# Patient Record
Sex: Male | Born: 1960
Health system: Southern US, Community
[De-identification: ages and names within clinical notes are randomized; demographics above are authoritative.]

## PROBLEM LIST (undated history)

## (undated) DIAGNOSIS — R972 Elevated prostate specific antigen [PSA]: Secondary | ICD-10-CM

## (undated) DIAGNOSIS — E559 Vitamin D deficiency, unspecified: Secondary | ICD-10-CM

## (undated) DIAGNOSIS — E785 Hyperlipidemia, unspecified: Secondary | ICD-10-CM

## (undated) DIAGNOSIS — I10 Essential (primary) hypertension: Secondary | ICD-10-CM

## (undated) DIAGNOSIS — E1165 Type 2 diabetes mellitus with hyperglycemia: Secondary | ICD-10-CM

## (undated) HISTORY — DX: Elevated prostate specific antigen (PSA): R97.20

## (undated) HISTORY — DX: Type 2 diabetes mellitus with hyperglycemia: E11.65

## (undated) HISTORY — DX: Vitamin D deficiency, unspecified: E55.9

## (undated) HISTORY — DX: Hyperlipidemia, unspecified: E78.5

## (undated) HISTORY — DX: Hypercalcemia: E83.52

---

## 2011-02-27 ENCOUNTER — Encounter: Payer: Self-pay | Admitting: Internal Medicine

## 2011-02-27 ENCOUNTER — Emergency Department (HOSPITAL_COMMUNITY): Payer: BC Managed Care – PPO

## 2011-02-27 ENCOUNTER — Inpatient Hospital Stay (HOSPITAL_COMMUNITY)
Admission: EM | Admit: 2011-02-27 | Discharge: 2011-03-01 | DRG: 125 | Disposition: A | Discharge status: Home / Self Care | Payer: BC Managed Care – PPO | Attending: Internal Medicine | Admitting: Internal Medicine

## 2011-02-27 DIAGNOSIS — R0789 Other chest pain: Principal | ICD-10-CM | POA: Diagnosis present

## 2011-02-27 DIAGNOSIS — J3489 Other specified disorders of nose and nasal sinuses: Secondary | ICD-10-CM | POA: Diagnosis present

## 2011-02-27 DIAGNOSIS — Z8249 Family history of ischemic heart disease and other diseases of the circulatory system: Secondary | ICD-10-CM

## 2011-02-27 DIAGNOSIS — I10 Essential (primary) hypertension: Secondary | ICD-10-CM | POA: Diagnosis present

## 2011-02-27 DIAGNOSIS — F17200 Nicotine dependence, unspecified, uncomplicated: Secondary | ICD-10-CM | POA: Diagnosis present

## 2011-02-27 DIAGNOSIS — E785 Hyperlipidemia, unspecified: Secondary | ICD-10-CM | POA: Diagnosis present

## 2011-02-27 LAB — CBC
HCT: 43.6 % (ref 39.0–52.0)
Hemoglobin: 15.5 g/dL (ref 13.0–17.0)
MCH: 31.9 pg (ref 26.0–34.0)
MCHC: 35.6 g/dL (ref 30.0–36.0)
MCV: 89.7 fL (ref 78.0–100.0)
Platelets: 293 10*3/uL (ref 150–400)
RBC: 4.86 MIL/uL (ref 4.22–5.81)
RDW: 13.4 % (ref 11.5–15.5)
WBC: 14.9 10*3/uL — ABNORMAL HIGH (ref 4.0–10.5)

## 2011-02-27 LAB — CK TOTAL AND CKMB (NOT AT ARMC)
CK, MB: 2.7 ng/mL (ref 0.3–4.0)
Relative Index: 1.3 (ref 0.0–2.5)
Total CK: 201 U/L (ref 7–232)

## 2011-02-27 LAB — DIFFERENTIAL
Basophils Absolute: 0 10*3/uL (ref 0.0–0.1)
Basophils Relative: 0 % (ref 0–1)
Eosinophils Absolute: 0 10*3/uL (ref 0.0–0.7)
Eosinophils Relative: 0 % (ref 0–5)
Lymphocytes Relative: 10 % — ABNORMAL LOW (ref 12–46)
Lymphs Abs: 1.4 10*3/uL (ref 0.7–4.0)
Monocytes Absolute: 0.3 10*3/uL (ref 0.1–1.0)
Monocytes Relative: 2 % — ABNORMAL LOW (ref 3–12)
Neutro Abs: 13.1 10*3/uL — ABNORMAL HIGH (ref 1.7–7.7)
Neutrophils Relative %: 88 % — ABNORMAL HIGH (ref 43–77)

## 2011-02-27 LAB — COMPREHENSIVE METABOLIC PANEL
ALT: 42 U/L (ref 0–53)
AST: 26 U/L (ref 0–37)
Albumin: 4.4 g/dL (ref 3.5–5.2)
Alkaline Phosphatase: 68 U/L (ref 39–117)
BUN: 10 mg/dL (ref 6–23)
CO2: 24 mEq/L (ref 19–32)
Calcium: 10.4 mg/dL (ref 8.4–10.5)
Chloride: 103 mEq/L (ref 96–112)
Creatinine, Ser: 0.9 mg/dL (ref 0.50–1.35)
GFR calc Af Amer: >90 mL/min (ref >90)
GFR calc non Af Amer: >90 mL/min (ref >90)
Glucose, Bld: 132 mg/dL — ABNORMAL HIGH (ref 70–99)
Potassium: 4 mEq/L (ref 3.5–5.1)
Sodium: 138 mEq/L (ref 135–145)
Total Bilirubin: 0.4 mg/dL (ref 0.3–1.2)
Total Protein: 7.9 g/dL (ref 6.0–8.3)

## 2011-02-27 LAB — PROTIME-INR
INR: 0.98 (ref 0.00–1.49)
Prothrombin Time: 13.2 seconds (ref 11.6–15.2)

## 2011-02-27 LAB — TROPONIN I: Troponin I: <0.3 ng/mL (ref <0.30)

## 2011-02-27 NOTE — H&P (Signed)
Hospital Admission Note Date: 02/27/2011  Patient name: Gregory Sosa Medical record number: [id number] Date of birth: Feb 28, 1961 Age: 50 y.o. Gender: male PCP: none.  Unassigned   Medical Service: Internal medicine teaching service  Attending physician:  Dr. Josem Kaufmann    1st Contact: Dr. Yaakov Guthrie    Pager: [phone] 2nd Contact: Dr Dorthula Rue    Pager: [phone]  After 5 pm or weekends: 1st Contact:   Pager: [phone] 2nd Contact:   Pager: [phone]  Chief Complaint: Chest pain, chest congestion, nausea, and vomiting  History of Present Illness: Patient is a 50 year old male with no past medical history who presents to [facility] emergency room with complaint of chest pain of one-day duration.  He describes this pain as a 7/10, mid to left-sided, non-radiating pain that feels like a tightening/squeezing . He denies any precipitating or alleviating factors and notes the pain has been constant and has not abated since it began earlier on the day of admission.  He notes his pain is not improved with the nitroglycerin drip started in the ER.  He notes some associated diaphoresis as well as nausea with 5 episodes of vomiting nonbilious, nonbloody emesis on the day of admission.   He reports some sensation of reflux but notes his symptoms are not improved with belching. He denies diarrhea.  He also reports some shortness of breath and dyspnea on exertion.  He denies syncope.  Additionally, he reports chest congestion for 2 weeks.  He admits to "mild" fevers at home but did not take his temperature. He reports an associated cough productive of milky, white sputum and also notes sinus congestion and a feeling of generalized weakness.  He has been taking over-the-counter Robitussin as well as Sudafed with no relief of his symptoms.  He reports his wife was recently ill with similar symptoms.  Past medical history: Patient denies any known past medical history.  Past surgical history: none  Meds: Over-the-counter  Robitussin Over-the-counter Sudafed Patient takes no medications on a daily basis  Allergies: none  Family History: Father: Deceased, age 71 secondary to MI.  History of hypertension Mother: Janalyn Shy, age 53 with diabetes mellitus and hypertension. Sister: Diabetes mellitus and hypertension  Social History: Patient is married with one son. 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 He smokes one half a pack of cigarettes per day and has smoked for approximately 20 years.  He consumes 2 glasses of wine with dinner daily. Denies any illicit drug use.  Review of Systems: Pertinent items are noted in HPI.  Physical Exam: Vital signs: T.: 98.8, HR: 67, BP: 154/82, RR: 18, O2 sat 99% on RA VItal signs reviewed and stable. GEN: Patient in mild distress secondary to pain.  Alert and oriented x 3.  Pleasant, conversant, and cooperative to exam. HEENT: head is autraumatic and normocephalic.  Neck is supple without palpable masses or lymphadenopathy.  No JVD or carotid bruits.  Vision intact.  EOMI.  PERRLA.  Sclerae anicteric.  Conjunctivae without pallor or injection. Mucous membranes are moist.   RESP:  Lungs are clear to ascultation bilaterally with diminished air movement.  No wheezes, ronchi, or rubs. CARDIOVASCULAR: regular rate, normal rhythm.  Clear S1, S2, no murmurs, gallops, or rubs. ABDOMEN: soft, non-tender, non-distended.  Bowels sounds present in all quadrants and slightly hypoactive.  No palpable masses. EXT: warm and dry.  Peripheral pulses equal, intact, and +2 globally.  No clubbing or cyanosis.  Trace edema in bilateral lower extremities. SKIN: warm  and dry with normal turgor.  No rashes or abnormal lesions observed. NEURO: CN II-XII grossly intact.  Muscle strength +5/5 in bilateral upper and lower extremities.  Sensation is grossly intact.  No focal deficit.   Lab results: Basic Metabolic Panel:  [facility] 02/27/11 2154  NA 138  K 4.0  CL 103  CO2 24    GLUCOSE 132*  BUN 10  CREATININE 0.90  CALCIUM 10.4  MG --  PHOS --   Liver Function Tests:  [facility] 02/27/11 2154  AST 26  ALT 42  ALKPHOS 68  BILITOT 0.4  PROT 7.9  ALBUMIN 4.4    CBC:  [facility] 02/27/11 2154  WBC 14.9*  NEUTROABS 13.1*  HGB 15.5  HCT 43.6  MCV 89.7  PLT 293   Cardiac Enzymes:  [facility] 02/27/11 2154  CKTOTAL 201  CKMB 2.7  CKMBINDEX --  TROPONINI <0.30     Imaging results:  Chest x-ray, portable: Findings: Midline trachea. Normal heart size and mediastinal contours. No pleural effusion or pneumothorax. Mildly low lumbars. Clear lungs. No free intraperitoneal air.  Impression: Low lung volumes, without acute disease.  Other results: EKG: Normal sinus rhythm with left axis deviation. Normal intervals. T wave inversion noted in the inferolateral leads.  Assessment & Plan by Problem:  Chest pain: Patient is a 50 year old male with no known past medical history presenting with one day of constant mid to lower substernal chest pain.  His EKG reveals T wave inversions in leads 2, 3, aVF as well as V5 and V6; there are no other findings to suggest pericarditis or tamponade.  The first set of cardiac enzymes are negative.  ACS remains a distinct possibility and will pursue workup accordingly.  Chest x-ray on did not reveal evidence of pneumothorax, pneumonia, or mediastinal widening to suggest aortic dissection.  Esophageal rupture also is unlikely as patient is not experiencing hematemesis.  GERD, esophageal spasm, and gallbladder dysfunction remain on the differential given his symptoms and associated nausea and vomiting. - Admit to telemetry - Cycle cardiac enzymes - 12-lead EKG in the morning - Pain control with morphine and sublingual nitroglycerin - Will give protonix for empiric treatment of GERD - Risk stratify with fasting lipid panel and hemoglobin A1c in the morning - ASA 81mg  daily  Sinus congestion and cough: Patient reports sinus  congestion with cough productive of milky mucus for approximately 2 weeks.  He is currently afebrile and hemodynamically stable. Chest x-ray did not reveal evidence of pneumonia, however only single portable film obtained; will order PA and lateral chest x-ray for further evaluation.  His CBC reveals leukocytosis with a left shift and ANC of 13.1 suggesting underlying infectious process.  Given the duration of his symptoms and reports of sinus congestion and increased sinus drainage,  it is reasonable to treat him empirically for acute sinusitis. - 2 view chest x-ray - Doxycycline 100 mg by mouth twice a day for 7-10 days  Nausea and vomiting:  It is possible these symptoms are related to problem #1 or #2. It is unlikely that he is suffering from gastroenteritis given his lack of diarrhea.   - Will give Zofran for symptomatic control.   Hypertension: Patient's blood pressure is elevated arrival to the emergency room. Is very likely that he suffers from previously undiagnosed hypertension especially given his family history. Will continue to monitor this.  Would consider initiating antihypertensive therapy with HCTZ. Will defer this to the primary team.  Tobacco use: Patient declines nicotine patch -  Will obtain smoking cessation consult  DVT prophylaxis: lovenox  R3______________________________ Dr. Nelda Bucks [phone]     R1______________________________ Dr. Vernice Jefferson [phone]  ATTENDING: I performed and/or observed a history and physical examination of the patient.  I discussed the case with the residents as noted and reviewed the residents' notes.  I agree with the findings and plan--please refer to the attending physician note for more details.  Signature________________________________  Printed Name_____________________________

## 2011-02-28 ENCOUNTER — Emergency Department (HOSPITAL_COMMUNITY): Payer: BC Managed Care – PPO

## 2011-02-28 DIAGNOSIS — R079 Chest pain, unspecified: Secondary | ICD-10-CM

## 2011-02-28 DIAGNOSIS — R0602 Shortness of breath: Secondary | ICD-10-CM

## 2011-02-28 DIAGNOSIS — J3489 Other specified disorders of nose and nasal sinuses: Secondary | ICD-10-CM

## 2011-02-28 LAB — LIPID PANEL
Cholesterol: 174 mg/dL (ref 0–200)
HDL: 43 mg/dL (ref >39)
LDL Cholesterol: 124 mg/dL — ABNORMAL HIGH (ref 0–99)
Total CHOL/HDL Ratio: 4 RATIO
Triglycerides: 35 mg/dL (ref <150)
VLDL: 7 mg/dL (ref 0–40)

## 2011-02-28 LAB — CBC
HCT: 40 % (ref 39.0–52.0)
Hemoglobin: 13.8 g/dL (ref 13.0–17.0)
MCH: 30.7 pg (ref 26.0–34.0)
MCHC: 34.5 g/dL (ref 30.0–36.0)
MCV: 89.1 fL (ref 78.0–100.0)
Platelets: 294 10*3/uL (ref 150–400)
RBC: 4.49 MIL/uL (ref 4.22–5.81)
RDW: 13.5 % (ref 11.5–15.5)
WBC: 12 10*3/uL — ABNORMAL HIGH (ref 4.0–10.5)

## 2011-02-28 LAB — GLUCOSE, CAPILLARY
Glucose-Capillary: 124 mg/dL — ABNORMAL HIGH (ref 70–99)
Glucose-Capillary: 150 mg/dL — ABNORMAL HIGH (ref 70–99)

## 2011-02-28 LAB — MRSA PCR SCREENING: MRSA by PCR: NEGATIVE

## 2011-02-28 LAB — TSH: TSH: 2.458 u[IU]/mL (ref 0.350–4.500)

## 2011-02-28 LAB — HEMOGLOBIN A1C
Hgb A1c MFr Bld: 6.6 % — ABNORMAL HIGH (ref <5.7)
Mean Plasma Glucose: 143 mg/dL — ABNORMAL HIGH (ref <117)

## 2011-02-28 LAB — CARDIAC PANEL(CRET KIN+CKTOT+MB+TROPI)
CK, MB: 2.4 ng/mL (ref 0.3–4.0)
CK, MB: 2.3 ng/mL (ref 0.3–4.0)
CK, MB: 2.2 ng/mL (ref 0.3–4.0)
Relative Index: 1.7 (ref 0.0–2.5)
Relative Index: 1.6 (ref 0.0–2.5)
Relative Index: 1.6 (ref 0.0–2.5)
Total CK: 144 U/L (ref 7–232)
Total CK: 141 U/L (ref 7–232)
Total CK: 140 U/L (ref 7–232)
Troponin I: <0.3 ng/mL (ref <0.30)
Troponin I: <0.3 ng/mL (ref <0.30)
Troponin I: <0.3 ng/mL (ref <0.30)

## 2011-02-28 LAB — HEPARIN LEVEL (UNFRACTIONATED): Heparin Unfractionated: 0.59 IU/mL (ref 0.30–0.70)

## 2011-02-28 LAB — MAGNESIUM: Magnesium: 2 mg/dL (ref 1.5–2.5)

## 2011-02-28 LAB — POCT ACTIVATED CLOTTING TIME: Activated Clotting Time: 155 seconds

## 2011-03-01 ENCOUNTER — Inpatient Hospital Stay (HOSPITAL_COMMUNITY): Payer: BC Managed Care – PPO

## 2011-03-01 LAB — CBC
HCT: 41.4 % (ref 39.0–52.0)
Hemoglobin: 14 g/dL (ref 13.0–17.0)
MCH: 30.8 pg (ref 26.0–34.0)
MCHC: 33.8 g/dL (ref 30.0–36.0)
MCV: 91 fL (ref 78.0–100.0)
Platelets: 295 10*3/uL (ref 150–400)
RBC: 4.55 MIL/uL (ref 4.22–5.81)
RDW: 13.6 % (ref 11.5–15.5)
WBC: 9.8 10*3/uL (ref 4.0–10.5)

## 2011-03-01 LAB — BASIC METABOLIC PANEL
BUN: 9 mg/dL (ref 6–23)
CO2: 24 mEq/L (ref 19–32)
Calcium: 9.2 mg/dL (ref 8.4–10.5)
Chloride: 103 mEq/L (ref 96–112)
Creatinine, Ser: 0.9 mg/dL (ref 0.50–1.35)
GFR calc Af Amer: >90 mL/min (ref >90)
GFR calc non Af Amer: >90 mL/min (ref >90)
Glucose, Bld: 113 mg/dL — ABNORMAL HIGH (ref 70–99)
Potassium: 3.8 mEq/L (ref 3.5–5.1)
Sodium: 138 mEq/L (ref 135–145)

## 2011-03-01 LAB — GLUCOSE, CAPILLARY: Glucose-Capillary: 114 mg/dL — ABNORMAL HIGH (ref 70–99)

## 2011-03-01 LAB — HEPARIN LEVEL (UNFRACTIONATED): Heparin Unfractionated: <0.1 IU/mL — ABNORMAL LOW (ref 0.30–0.70)

## 2011-03-01 NOTE — Cardiovascular Report (Signed)
NAME:  Gregory Sosa, ENGEN NO.:  [id number]  MEDICAL RECORD NO.:  [id number]  LOCATION:  2506                         FACILITY:  [facility]  PHYSICIAN:  Landry Corporal, MD DATE OF BIRTH:  24-Feb-1961  DATE OF PROCEDURE:  02/28/2011 DATE OF DISCHARGE:                           CARDIAC CATHETERIZATION   PRIMARY PHYSICIAN ON THE SERVICE:  Doneen Poisson, MD, Teaching Service.  PRIMARY CARDIOLOGIST:  Italy Hilty, MD  PROCEDURE PERFORMING PHYSICIAN:  Landry Corporal, MD  PROCEDURES PERFORMED: 1. Left heart catheterization via 5-French right femoral artery     access. 2. Left ventriculography in the RAO projection using 11 mL of contrast     for 33 seconds. 3. Native coronary angiography. 4. Intracoronary nitroglycerin injection.  INDICATION: 1. Chest pain/pressure. 2. Shortness of breath.  BRIEF HISTORY:  Mr. Mayberry is a 50 year old African American gentleman with a history of hypertension, tobacco abuse, and family history of coronary artery disease who presented to the emergency room with chest discomfort on February 27, 2011.  Symptoms began about 3 p.m. while he was out in the yard playing with children and developed 7/10 left-sided chest pain after smoking a cigarette.  He had some diaphoresis with nausea and vomiting.  EKGs were noted to have T-wave inversions in the inferolateral distribution.  Based on the abnormal ECG and his symptoms, [facility] was consulted.  The patient seen by Dr. Rennis Golden and the decision was made to proceed with diagnostic cardiac catheterization.  Also, of note, the patient has been having a significant cough over the last couple of weeks with possible upper respiratory infection/myocarditis.  The risks, benefits, alternatives, and indications of procedure were explained to the patient in detail.  Risks include but not limited to death, ventricular tachycardia/atrial fibrillation, myocardial infarction,  stroke, adverse medical reaction, contrast-induced renal insufficiency, vascular or coronary injury requiring emergent vascular cardiac surgery, bradycardia requiring pacemaker placement.  The patient voiced understanding and agreed to proceed.  Informed consent was obtained with signed form placed on chart.  PROCEDURE:  The patient was brought to [facility], prepped and draped in a sterile fashion.  After a time-out period was performed, the patient was sedated with intravenous Versed and fentanyl and the right groin was anesthetized using 1% subcutaneous lidocaine.  The right femoral head was localized using tactile fluoroscopic guidance.  The right common femoral artery was accessed using modified Seldinger technique with placement of 5-French sheath.  Sheaths  were aspirated and flushed and then first a 5-French JL-4 followed by a 5-French JR-4 catheter advanced over wire.  Multiple angiographic views of left and right coronary system were obtained.  For the left coronary angiography, 200 mcg of intracoronary nitroglycerin was administered to evaluate for any evidence of the spasm.  After the right coronary angiography was performed, the JL-4 catheter was exchanged over a wire for an angled pigtail catheter which was used to advance across the aortic valve over a wire.  Left ventricular hemodynamics were then measured and then the left ventriculography was performed in the RAO projection.  Left ventricle hemodynamics were then remeasured and the catheter was pulled back across  aortic valve measuring pullback gradient.  The patient was stable before, during, and after the procedure. Estimated blood loss was less than 10 mL and there were no complications.  CATH LAB STATISTICS: 1. Sedation.  No intravenous medications.  The patient was     premedicated with Valium 5 mg p.o. and Benadryl 25 mg p.o. 2. Antihypertensives, a total of 20 mg of IV  hydralazine and two     boluses of 10 mg was administered. 3. Intracoronary nitroglycerin 200 mcg as well as intraarterial     nitroglycerin 200 mcg. 4. Total contrast was 90 mL.  HEMODYNAMICS: 1. Left ventricular pressure 158/16 mmHg with EDP of 22 mmHg. 2. Central aortic pressure 167/82 mmHg with mean of 110 mmHg.  This is     after the hydralazine was administered.  LEFT VENTRICULOGRAPHY:  Ejection fraction is 60-65% with no wall motion abnormalities.  ANGIOGRAPHIC FINDINGS: 1. Left main is a large-caliber vessel, which trifurcates into an LAD,     ramus, and circumflex artery.  There is also a very small septal     trunk becomes relative left main as well as a small atrial branch     as well.  There is no significant disease in the left main. 2. LAD is a moderate-caliber vessel, gives rise to a small first     diagonal branch.  It bifurcates into the another also relatively     small diagonal branch in the midportion.  The LAD reached down     around the apex.  It gave rise to a small diagonal branch down     below.  There is no significant disease in the entire LAD     distribution.  The wraparound apex likely fills at least the distal     third of the of the posterior descending artery. 3. Ramus intermedius is a moderate to large caliber vessel, at least     the same size as the LAD.  It bifurcates and basically gives what     would be a diagonal as well as an obtuse marginal distribution.  No     significant disease noted. 4. The circumflex is somewhat tortuous and gives rise to at least 2     obtuse marginals before going into the atrioventricular groove     where it gives off at least 2 posterolateral branches. 5. The right coronary artery is a dominant vessel.  It has a very     proximal conus branch.  After sharp bend, in the midportion, there     is an interventricular branch.  The vessel then bifurcates into an     extending posterior atrioventricular groove artery  and a small     posterolateral branch.  The continuation into the posterior     descending artery is a relatively small vessel.  It probably gives     to about the two-thirds distance to the apex.  No coronary artery     disease in the RCA noted.  IMPRESSION: 1. No angiographic evidence of any significant coronary artery disease     to explain the patient's chest pain, therefore symptoms are most     likely nonanginal in nature. 2. Severe hypertension with elevated end-diastolic pressure, although     the patient's blood pressure was relatively stable pre-cath. 3. Normal left ventricular ejection fraction with no wall motion     abnormalities. 4. EKG changes are most likely secondary to potentially left     ventricular hypertrophy  given hypertension, but we will review     echocardiogram to determine if this is correct.  PLAN: 1. Standard postcatheterization care after sheath removal in the     holding area. 2. Blood pressure control. 3. Treat upper respiratory tract infection per Teaching Service.          ______________________________ Landry Corporal, MD     DWH/MEDQ  D:  02/28/2011  T:  03/01/2011  Job:  [id number]  cc:   Italy Hilty, MD 2nd [facility]iac [facility]  Electronically Signed by Bryan Lemma MD on 03/01/2011 09:50:11 PM

## 2011-03-07 ENCOUNTER — Telehealth: Payer: Self-pay | Admitting: *Deleted

## 2011-03-07 NOTE — Telephone Encounter (Signed)
Pt called with c/o swelling to catheterization site. Pt was cathed on 10/23 during hospitalization by Dr Herbie Baltimore with [facility]. I called pt and gave him there # [phone]) and asked him to call there office, as they would be the one to evaluate cath site.   He will call back if any problem.

## 2011-03-07 NOTE — Telephone Encounter (Signed)
I agree

## 2011-03-09 NOTE — Consult Note (Signed)
NAMEMarland Kitchen  Gregory, Sosa NO.:  [id number]  MEDICAL RECORD NO.:  [id number]  LOCATION:  2923                         FACILITY:  [facility]  PHYSICIAN:  Italy Karrington Studnicka, MD         DATE OF BIRTH:  1960/06/05  DATE OF CONSULTATION:  02/28/2011 DATE OF DISCHARGE:                                CONSULTATION   CHIEF COMPLAINT:  Chest pain.  HISTORY OF PRESENT ILLNESS:  Gregory Sosa is a pleasant 50 year old African American male with history of tobacco abuse, and family history of coronary artery disease who presented to the emergency department yesterday with complaints of chest discomfort.  He tells me that his symptoms began around 3:00 p.m. after he had been out in the yard playing with his children, he went inside to smoke a cigarette and then developed 7/10 left anterior chest discomfort.  The discomfort did not radiate.  He had experienced some diaphoresis with this as well as nausea and vomiting.  He does report some mild shortness of breath with this.  He has not noted any change in his activity tolerance nor has he experienced any exertional dyspnea.  His initial EKG in the emergency department revealed sinus rhythm with T-wave inversions inferior and lateral.  He was treated with sublingual nitroglycerin without any improvement.  However, repeat EKG was performed, which did reveal continued T-wave inversion inferiorly, however, there was evidence of minimal ST elevation in V2 as well as T-wave inversions in V3 through V6, which is persistent at this time.  He has vomited several times and is on IV nitroglycerin currently as well as heparin and he is currently pain free.  His initial cardiac enzymes were negative and a 2nd set is currently pending.  We are asked to consult regarding his abnormal EKG and possible coronary artery disease.  PAST MEDICAL HISTORY:  Unremarkable.  FAMILY HISTORY:  Father had an MI at 100 passed away.  Mother has hypertension.  SOCIAL  HISTORY:  He is married.  His wife is a Engineer, civil (consulting) here on March 3300. He smokes a half pack of cigarettes a day.  He has 3 glasses of wine daily.  He works as a Production designer, theatre/television/film in a Programme researcher, broadcasting/film/video.  ALLERGIES:  None known.  CURRENT MEDICATIONS:  None.  REVIEW OF SYSTEMS:  As per HPI, otherwise negative.  Cough and respiratory infection recently, taking over-the-counter decongestant.  PHYSICAL EXAM:  VITAL SIGNS:  Blood pressure is 114/65, pulse is 72 and regular, pulse ox is 98%, respirations 16. GENERAL:  This is a pleasant 50 year old African American male in no acute distress. HEENT:  Pupils are equal, reactive to light and accommodation. Extraocular movements intact. NECK:  Supple.  No JVD, carotid bruits, or thyromegaly.  CARDIOVASCULAR: Regular rate and rhythm.  S1-S2 without appreciable murmur, gallop or rub. LUNGS:  Clear to auscultation bilaterally.  Normal respiratory effort. ABDOMEN:  Soft, nontender, without hepatosplenomegaly or masses. EXTREMITIES:  Radial, femoral, dorsal pedal arteries are present, without lower extremity edema.  No clubbing, cyanosis, or ulcers. NEUROLOGIC:  :  Oriented to person, place, and time.  Normal mood and affect.  LABORATORY DATA:  EKG is described above.  Cardiac and initial cardiac enzymes are negative.  Glucose is 132, BUN 10 and creatinine 0.9.  PT is 13.2, INR 0.9.  White blood cell count is 14.9.  Chest x-ray reveals questionable mild lower lobe opacity in the lateral view.  IMPRESSION: 1. Chest pain, abnormal EKG. 2. Family history of coronary artery disease. 3. Unknown lipid status. 4. Possible pneumonia.  PLAN:  We will continue with the IV nitroglycerin as well as heparin per pharmacy.  We will start him on low-dose beta blocker and aspirin. Given his EKG changes and his risk factors, I feel that he would have cardiac catheterization will be best to evaluate for obstructive coronary artery disease.  We will continue to cycle his  enzymes.  We will check an echo as well as a TSH and a hemoglobin A1c.  We will check a fasting lipid panel this morning.  He is to remain n.p.o.    ______________________________ [employer], NP   ______________________________ [place] Varetta Chavers, MD    LS/MEDQ  D:  02/28/2011  T:  02/28/2011  Job:  [id number]  cc:   [facility]  Electronically Signed by Charmian Muff NP on 03/06/2011 09:55:10 PM Electronically Signed by Kirtland Bouchard. Kalissa Grays M.D. on 03/09/2011 08:18:49 AM

## 2011-03-12 NOTE — Discharge Summary (Signed)
NAME:  LANGDON, CROSSON NO.:  [id number]  MEDICAL RECORD NO.:  [id number]  LOCATION:  2506                         FACILITY:  [facility]  PHYSICIAN:  Doneen Poisson, MD     DATE OF BIRTH:  08-04-1960  DATE OF ADMISSION:  02/27/2011 DATE OF DISCHARGE:  03/01/2011                              DISCHARGE SUMMARY   DISCHARGE DIAGNOSES: 1. Chest pain:  No evidence of coronary artery disease on cardiac     catheterization.  Etiology uncertain.  Differential diagnosis      included coronary vasospasm, gastrointestinal etiology or      acute viral upper respiratory tract infection. 2. Hypertension. 3. Cough. 4. Nausea and vomiting. 5. Tobacco use. 6. Diabetes, Type II, diagnosed October 2012.  DISCHARGE MEDICATIONS: 1. Acetaminophen 6 mg by mouth every 4 hours as needed for pain. 2. Amlodipine 5 mg by mouth daily. 3. Nicotine patch 21 mg for 24 hours transdermally daily. 4. Omeprazole 20 mg by mouth daily. 5. Aspirin 81 mg by mouth daily. 6. Robitussin 2 teaspoons by mouth every 6 hours as needed for cough.  DISPOSITION:  Mr. Rands is scheduled to follow up in the [facility] with Dr. Elyse Jarvis.  At this time, he should establish care as he has not seen a doctor for many many years.   His hypertension control should be readdressed as he was found to be hypertensive during this hospitalization, and was started on amlodipine. Of note, his hemoglobin A1c was 6.6%, so he qualifies for a diagnosis  of diabetes mellitus.  During this hospitalization, his lipid panel was checked and demonstrated a total cholesterol of 174 and an LDL of 124. On follow-up in the outpatient clinic, this should be addressed and possible treatment should be considered given the diagnosis of diabetes mellitus.  PROCEDURES: 1. Portable chest x-ray, February 27, 2011:  Low lung volumes without     acute disease. 2. Two view chest x-ray February 28, 2011.  No evidence of  pleural     effusion or pneumothorax.  Lungs are well aerated.  The heart is     normal in size.  There is a question of mild lower lobe opacity on     the lateral view given increased density overlying the lower     thoracic spine. 3. Esophagram/barium swallow March 01, 2011:  Normal.  Negative for     stricture.  Negative for mass or diverticulum.  Negative for hiatal     hernia or reflux.  Barium tablet passed through the stomach without     delay. 4. Cardiac catheterizations March 01, 2011:  Performed by Dr. Morton Stall:  No angiographic evidence of any significant coronary     artery disease to explain his chest pain.  Severe hypertension with      elevated end-diastolic pressure although his blood pressure was      relatively stable precatheterization.  Normal left ventricular      ejection fraction with no wall motion abnormalities.  CONSULTATIONS:  Cardiology.  ADMISSION HISTORY AND PHYSICAL:  Mr. Capp is a 50 year old man with no past medical history who presents to  [facility] Emergency Room withcomplaints of chest pain of 1-day duration.  He describes the pain as 7/10 mid to left-sided nonradiating pain that feels like a tightening/squeezing.  He denies any precipitating or alleviating factors and notes the pain has been constant and has not abated since it began earlier on the day of admission.  He notes his pain has not improved with the nitroglycerin drip started in the ER.  He notes some associated diaphoresis as well as nausea with 5 episodes of vomiting nonbilious, nonbloody emesis on the day of admission.  He reports some sensation of reflux.  He denies diarrhea.  He also reports some  shortness of breath and dyspnea on exertion.  He denies syncope. Additionally, he reports chest congestion for 2 weeks.  He admits to mild fevers at home but did not take his temperature.  He reports associated cough productive of milky white sputum and also notes  sinus congestion and a feeling of generalized weakness.  He has been taking over-the-counter Robitussin as well as Sudafed with no relief of his symptoms.  He reports his wife was recently ill with similar symptoms  ADMISSION PHYSICAL EXAMINATION: VITAL SIGNS:  Temperature 98.8, heart rate 67, blood pressure 154/82,  respirations 18, O2 saturation 99% on room air. GENERAL:  Mild distress secondary to pain.  Alert and oriented x 3.   Pleasant, conversant and cooperative to exam. HEENT:  Head is atraumatic and normocephalic. NECK:  Supple without palpable masses or lymphadenopathy.  No JVD or carotid bruits.  Vision intact.  Extraocular muscle function intact. Pupils equally round and reactive to light anteriorly.  Sclerae are anicteric.  Conjunctiva without pallor or injection.  Mucous membranes are moist. RESPIRATIONS:  Lungs are clear to auscultation bilaterally with diminished air movement. CARDIOVASCULAR:  Regular rate and rhythm.  No murmurs, rubs, or gallops. ABDOMEN:  Soft, nontender, and nondistended.  Bowel sounds present in all quadrants and slightly hypoactive.  No palpable masses. EXTREMITIES:  Warm and dry.  Peripheral pulses equal, intact and 2+ globally.  No clubbing or cyanosis.  Trace edema in bilateral lower extremities. SKIN:  Warm and dry with normal turgor.  No rashes or abdominal lesions observed. NEURO:  Cranial nerves II-XII grossly intact.  Muscle strength 5/5 in bilateral upper and lower extremities.  Sensation is grossly intact.  No focal deficits.  ADMISSION LAB RESULTS: Basic metabolic panel:  Sodium 138, potassium 4.0, chloride 103, bicarb  24, glucose 132, BUN 10, creatinine 0.90, calcium 10.4. Liver function tests:  AST 26, ALT 42, alkaline phosphatase 68, total bilirubin 0.4, protein 7.9, 7.9, albumin 4.4. CBC:  White blood cell count 14.9, absolute neutrophils 13.1, hemoglobin 15.5, hematocrit 43.6, platelets 293. Cardiac enzymes:  CK total 201,  CK-MB 2.7, troponin I less than 0.30.  HOSPITAL COURSE BY PROBLEM: 1. Chest pain:  Mr. Pellow presented with 1 day of constant mid lower     substernal chest pain.  On the night of admission, his EKG     developed dynamic T-wave inversions, not present on the admission EKG,     which resolved the following morning.  Three sets of cardiac enzymes      were negative.  He underwent cardiac catheterization and was found to     have no evidence of coronary artery disease.  His chest pain did not      respond to nitroglycerin but slowly resolved during the course of the      hospitalization.  Initially pneumonia was on the  differential, but on      further review of the admission chest x-ray and repeat 2 view chest     x-ray failed to reveal evidence of a pneumonia.  We could not rule      out coronary vasospasm causing ischemic chest pain, chest pain of      gastrointestinal origin, muscle skeletal origin, or due to an acute      upper respiratory tract infection.  He was risk stratified by checking      a hemoglobin A1c that was 6.6% as well as a fasting lipid panel that      revealed an LDL of 124 and a total cholesterol of 174.      As described in the problem for hypertension below, he was     started on amlodipine.  This would also treat coronary vasospasm if this     was the etiology of his chest pain.   A barium swallow was also performed     and was negative for any stricture or other abnormality.      Mr. Isensee was also started on proton pump inhibitor, omeprazole and     discharged in case the etiology of his chest pain was gastroesophageal     reflux disease.  He did report some sensation of reflux in the     days prior to admission.  2. Cough:  Mr. Dains presented with a cough productive of milky     mucus for approximately 2 weeks.  He also presented with a     leukocytosis with a left shift and absolute neutrophil count of     13.1.  Chest x-ray in our opinion was negative for  pneumonia.  This     was likely due to a viral upper respiratory tract infection.  3. Nausea and vomiting:  Etiology of this was uncertain.  He     was treated with Zofran for symptomatic control during this     hospitalization.  This is possibly related to his 2 weeks of cough     and likely upper respiratory tract infection.  4. Hypertension:  Mr. Tomko blood pressure on admission was     152/82.  He was started on metoprolol initially for blood     pressure control by the cardiology consultant.  Pressures during     hospitalization ranged from 114-141 systolic over 64-86 diastolic.     On discharge, metoprolol was changed to amlodipine.  The reasoning     behind this was that if the etiology of his chest pain     was indeed coronary vasospasm that amlodipine would help this as     well.  5. Tobacco use:  Smoking cessation consult was obtained.  He     declined nicotine patch during hospitalization, but a nicotine     patch was prescribed on discharge.  This should be further addressed on     follow-up with his new primary care provider in the outpatient     clinic.  DISCHARGE VITAL SIGNS:  Temperature 98.2, pulse 80, respirations 16, blood pressure 141/86, O2 saturation 98% on room air.  DISCHARGE LABORATORY RESULTS: 1. Basic metabolic panel:  Sodium 138, potassium 3.8, chloride 103,     bicarbonate 24, glucose 113, BUN 9, creatinine 0.90, calcium 9.2. 2. CBC: White blood cell count 9.8, hemoglobin 14.0, hematocrit 41.4,     platelets 295. 3. Cardiac enzymes negative x 3 sets. 4. TSH 2.458 (within normal limits). 5. Hemoglobin  A1c 6.6%. 6. Magnesium 2.0. 7. Lipid profile:  Total cholesterol 174, triglycerides 35, HDL 43,     LDL 124.   ______________________________ Blanca Friend, MD   ______________________________ Doneen Poisson, MD   BW/MEDQ  D:  03/05/2011  T:  03/05/2011  Job:  [id number]  Electronically Signed by Blanca Friend MD on 03/06/2011 10:13:07  PM Electronically Signed by Doneen Poisson  on 03/12/2011 05:54:52 PM

## 2011-03-21 ENCOUNTER — Ambulatory Visit (INDEPENDENT_AMBULATORY_CARE_PROVIDER_SITE_OTHER): Payer: BC Managed Care – PPO | Admitting: Internal Medicine

## 2011-03-21 DIAGNOSIS — Z720 Tobacco use: Secondary | ICD-10-CM

## 2011-03-21 DIAGNOSIS — E785 Hyperlipidemia, unspecified: Secondary | ICD-10-CM

## 2011-03-21 DIAGNOSIS — R079 Chest pain, unspecified: Secondary | ICD-10-CM

## 2011-03-21 DIAGNOSIS — I10 Essential (primary) hypertension: Secondary | ICD-10-CM

## 2011-03-21 DIAGNOSIS — F17200 Nicotine dependence, unspecified, uncomplicated: Secondary | ICD-10-CM

## 2011-03-21 DIAGNOSIS — E119 Type 2 diabetes mellitus without complications: Secondary | ICD-10-CM

## 2011-03-21 NOTE — Patient Instructions (Signed)
Pleasetake your medicines as prescribed. Please schedule a follow up appointment in 2 months - at that time, we will recheck your [facility]. Please take your medicines as prescribed.

## 2011-03-23 DIAGNOSIS — Z87898 Personal history of other specified conditions: Secondary | ICD-10-CM

## 2011-03-23 DIAGNOSIS — E1165 Type 2 diabetes mellitus with hyperglycemia: Secondary | ICD-10-CM

## 2011-03-23 DIAGNOSIS — E785 Hyperlipidemia, unspecified: Secondary | ICD-10-CM

## 2011-03-23 DIAGNOSIS — Z720 Tobacco use: Secondary | ICD-10-CM | POA: Insufficient documentation

## 2011-03-23 DIAGNOSIS — E119 Type 2 diabetes mellitus without complications: Secondary | ICD-10-CM | POA: Insufficient documentation

## 2011-03-23 DIAGNOSIS — IMO0002 Reserved for concepts with insufficient information to code with codable children: Secondary | ICD-10-CM

## 2011-03-23 DIAGNOSIS — I10 Essential (primary) hypertension: Secondary | ICD-10-CM | POA: Insufficient documentation

## 2011-03-23 HISTORY — DX: Personal history of other specified conditions: Z87.898

## 2011-03-23 HISTORY — DX: Type 2 diabetes mellitus with hyperglycemia: E11.65

## 2011-03-23 HISTORY — DX: Reserved for concepts with insufficient information to code with codable children: IMO0002

## 2011-03-23 HISTORY — DX: Hyperlipidemia, unspecified: E78.5

## 2011-03-23 NOTE — Assessment & Plan Note (Signed)
Hospital follow up for chest pain with ST- T wave changes in inferolateral leads but s/p clean cath with no evidence of significant CAD. Differentials for his presentation included hypertension related changes vs esophageal spasm. He denied any recurrence of chest pain since his discharge.He was discharged home on amlodipine and omeprazole but he has not started taking any of the meds. -He was counseled on the importance of taking his BP meds regularly. -Continue to monitor his symptoms.

## 2011-03-23 NOTE — Progress Notes (Signed)
  Subjective:    Patient ID: Gregory Sosa, male    DOB: December 11, 1960, 50 y.o.   MRN: [id number]  HPI; 50 year old man with newly diagnosed diabetes, HTN comes to the clinic for a hospital follow up.  He was recently hospitalized from 02/27/11 to 03/01/11 for chest pain evaluation that was associated with significant ST - T wave changes in the inferolateral  leads, subsequent to which he underwent diagnostic cath that revealed non - obstructive CAD and his symptoms were attributed to hypertension.  As of today, he denies nay recurrence of chest pain, SOB, diaphoresis, nausea or vomiting. He has not been taking any meds including anti- hypertensives that he was discharged on , from the hospital.      Review of Systems  Constitutional: Negative for fever, chills, appetite change and fatigue.  HENT: Negative for congestion, rhinorrhea, sneezing and postnasal drip.   Eyes: Negative for visual disturbance.  Respiratory: Negative for apnea, cough, choking, chest tightness, shortness of breath, wheezing and stridor.   Cardiovascular: Negative for chest pain, palpitations and leg swelling.  Gastrointestinal: Negative for nausea, vomiting, abdominal pain, diarrhea and blood in stool.  Genitourinary: Negative for dysuria, urgency and hematuria.  Musculoskeletal: Negative for arthralgias.  Neurological: Negative for dizziness.  Hematological: Negative for adenopathy.       Objective:   Physical Exam  Constitutional: He is oriented to person, place, and time. He appears well-developed and well-nourished. No distress.  HENT:  Head: Normocephalic and atraumatic.  Mouth/Throat: No oropharyngeal exudate.  Eyes: Conjunctivae and EOM are normal. Pupils are equal, round, and reactive to light. No scleral icterus.  Neck: Normal range of motion. Neck supple. No JVD present. No tracheal deviation present. No thyromegaly present.  Cardiovascular: Normal rate, regular rhythm, normal heart sounds and intact  distal pulses.  Exam reveals no gallop and no friction rub.   No murmur heard. Pulmonary/Chest: Effort normal and breath sounds normal. No stridor. No respiratory distress. He has no wheezes. He has no rales. He exhibits no tenderness.  Abdominal: Soft. Bowel sounds are normal. He exhibits no distension and no mass. There is no tenderness. There is no rebound and no guarding.  Musculoskeletal: Normal range of motion. He exhibits no edema and no tenderness.  Lymphadenopathy:    He has no cervical adenopathy.  Neurological: He is alert and oriented to person, place, and time. He has normal reflexes. He displays normal reflexes. No cranial nerve deficit. He exhibits normal muscle tone. Coordination normal.  Skin: Skin is warm. He is not diaphoretic.          Assessment & Plan:

## 2011-03-23 NOTE — Assessment & Plan Note (Signed)
Newly diagnosed diabetes with A1C of 6.6 in October 2012.  He regularly exercises and wanted to give a trial of getting that under  control with diet and exercise. - Will recheck his AIC in 3 months. - Will start him on metformin if his AIC deteriorates further in 3 months.

## 2011-03-23 NOTE — Assessment & Plan Note (Signed)
Lab Results  Component Value Date   NA 138 03/01/2011   K 3.8 03/01/2011   CL 103 03/01/2011   CO2 24 03/01/2011   BUN 9 03/01/2011   CREATININE 0.90 03/01/2011    BP Readings from Last 3 Encounters:  03/21/11 148/94    Assessment: Hypertension control:  mildly elevated  Progress toward goals:  unchanged Barriers to meeting goals:  lack of understanding of disease management  Plan: Hypertension treatment:  continue current medications. He was advised to start taking amlodipine that he was discharged on, from the hospital.

## 2011-03-25 ENCOUNTER — Encounter: Payer: Self-pay | Admitting: Internal Medicine

## 2011-03-25 NOTE — Assessment & Plan Note (Signed)
Would try to get that controlled with diet and exercise first.  - Recheck FLP in 3 months.  - Would start him on statin if his LDL is persistently elevated or deteriorates further.

## 2011-06-14 ENCOUNTER — Telehealth: Payer: Self-pay | Admitting: Dietician

## 2011-06-14 NOTE — Telephone Encounter (Signed)
Patient was contacted by office staff to make an appointment with his PCP.  This patient wishes not to make any appointments with the clinic at this time. He states that he "sugar is fine", "he uses a friend's meter to test", and that he isn't having any Diabetic issues at this time and doesn't take any meds. Will not sch appointment At this time as he was only here for a HFU and does not want a return visit and he does not feel like he has to come here.

## 2011-06-30 ENCOUNTER — Encounter: Payer: Self-pay | Admitting: Internal Medicine

## 2011-06-30 ENCOUNTER — Ambulatory Visit (INDEPENDENT_AMBULATORY_CARE_PROVIDER_SITE_OTHER): Payer: BC Managed Care – PPO | Admitting: Internal Medicine

## 2011-06-30 DIAGNOSIS — Z79899 Other long term (current) drug therapy: Secondary | ICD-10-CM

## 2011-06-30 DIAGNOSIS — I10 Essential (primary) hypertension: Secondary | ICD-10-CM

## 2011-06-30 DIAGNOSIS — E785 Hyperlipidemia, unspecified: Secondary | ICD-10-CM

## 2011-06-30 DIAGNOSIS — R079 Chest pain, unspecified: Secondary | ICD-10-CM

## 2011-06-30 DIAGNOSIS — E119 Type 2 diabetes mellitus without complications: Secondary | ICD-10-CM

## 2011-06-30 LAB — POCT GLYCOSYLATED HEMOGLOBIN (HGB A1C): Hemoglobin A1C: 6.5

## 2011-06-30 LAB — GLUCOSE, CAPILLARY: Glucose-Capillary: 113 mg/dL — ABNORMAL HIGH (ref 70–99)

## 2011-06-30 NOTE — Progress Notes (Signed)
  Subjective:    Patient ID: Gregory Sosa, male    DOB: Apr 27, 1961, 51 y.o.   MRN: [id number]  HPI: 51 year old man with the past medical history significant for hypertension, diabetes -diet controlled comes to the clinic for a followup visit.  He denies any complaints at today's visit including chest pain, palpitations, abdominal pain, nausea or vomiting.  He believes in diet and exercise and doesn't want to be get started on any new medications. He was even questioning his diagnosis of diabetes.       Review of Systems  Constitutional: Negative for fever and fatigue.  HENT: Negative for nosebleeds, congestion, rhinorrhea, postnasal drip and tinnitus.   Respiratory: Negative for cough, shortness of breath and stridor.   Cardiovascular: Negative for chest pain and leg swelling.  Genitourinary: Negative for dysuria.  Musculoskeletal: Negative for arthralgias.  Neurological: Negative for dizziness and light-headedness.       Objective:   Physical Exam  Constitutional: He is oriented to person, place, and time. He appears well-developed and well-nourished. No distress.  HENT:  Head: Normocephalic and atraumatic.  Mouth/Throat: No oropharyngeal exudate.  Eyes: Conjunctivae and EOM are normal. Pupils are equal, round, and reactive to light.  Neck: Normal range of motion. Neck supple. No JVD present. No tracheal deviation present. No thyromegaly present.  Cardiovascular: Normal rate, regular rhythm, normal heart sounds and intact distal pulses.  Exam reveals no gallop and no friction rub.   No murmur heard. Pulmonary/Chest: Effort normal and breath sounds normal. No stridor. No respiratory distress. He has no wheezes. He has no rales. He exhibits no tenderness.  Abdominal: Soft. Bowel sounds are normal. He exhibits no distension. There is no tenderness. There is no rebound.  Musculoskeletal: Normal range of motion.  Lymphadenopathy:    He has no cervical adenopathy.  Neurological: He  is alert and oriented to person, place, and time. He has normal reflexes.  Skin: He is not diaphoretic.          Assessment & Plan:

## 2011-06-30 NOTE — Assessment & Plan Note (Signed)
Denies any further episodes of further chest pain.

## 2011-06-30 NOTE — Assessment & Plan Note (Signed)
He would not like to be started on any medication at this time. Therefore would defer checking lipid panel until next visit.

## 2011-06-30 NOTE — Patient Instructions (Signed)
Please schedule a follow up appointment in 3-4 months . Please bring your medication bottles with your next appointment. Please take your medicines as prescribed. I will call you with your lab results if anything will be abnormal.  

## 2011-06-30 NOTE — Assessment & Plan Note (Signed)
Lab Results  Component Value Date   HGBA1C 6.5 06/30/2011   HGBA1C 6.6* 02/28/2011   CREATININE 0.90 03/01/2011   CHOL 174 02/28/2011   HDL 43 02/28/2011   TRIG 35 02/28/2011    Last eye exam and foot exam: Refused foot exam.   Assessment: Diabetes control: controlled Progress toward goals: at goal Barriers to meeting goals: lack of understanding of disease management  Plan: Diabetes treatment: He would like to continue with diet and exercise. I think it's still appropriate to keep him off any oral hypoglycemics at this point. Recheck A1c every 3 months. Refer to: none Instruction/counseling given: discussed foot care, discussed the need for weight loss and discussed diet

## 2011-06-30 NOTE — Assessment & Plan Note (Signed)
Lab Results  Component Value Date   NA 138 03/01/2011   K 3.8 03/01/2011   CL 103 03/01/2011   CO2 24 03/01/2011   BUN 9 03/01/2011   CREATININE 0.90 03/01/2011    BP Readings from Last 3 Encounters:  06/30/11 133/83  03/21/11 148/94    Assessment: Hypertension control:  controlled  Progress toward goals:  at goal Barriers to meeting goals:  no barriers identified  Plan: Hypertension treatment:  continue current medications

## 2011-10-03 ENCOUNTER — Encounter (HOSPITAL_COMMUNITY): Payer: Self-pay | Admitting: *Deleted

## 2011-10-03 ENCOUNTER — Emergency Department (HOSPITAL_COMMUNITY)
Admission: EM | Admit: 2011-10-03 | Discharge: 2011-10-03 | Disposition: A | Discharge status: Home / Self Care | Payer: Worker's Compensation | Attending: Emergency Medicine | Admitting: Emergency Medicine

## 2011-10-03 DIAGNOSIS — Z79899 Other long term (current) drug therapy: Secondary | ICD-10-CM | POA: Insufficient documentation

## 2011-10-03 DIAGNOSIS — Z7982 Long term (current) use of aspirin: Secondary | ICD-10-CM | POA: Insufficient documentation

## 2011-10-03 DIAGNOSIS — Z043 Encounter for examination and observation following other accident: Secondary | ICD-10-CM | POA: Insufficient documentation

## 2011-10-03 DIAGNOSIS — I10 Essential (primary) hypertension: Secondary | ICD-10-CM | POA: Insufficient documentation

## 2011-10-03 DIAGNOSIS — M542 Cervicalgia: Secondary | ICD-10-CM

## 2011-10-03 DIAGNOSIS — F17200 Nicotine dependence, unspecified, uncomplicated: Secondary | ICD-10-CM | POA: Insufficient documentation

## 2011-10-03 HISTORY — DX: Essential (primary) hypertension: I10

## 2011-10-03 MED ORDER — CYCLOBENZAPRINE HCL 10 MG PO TABS: 10.0000 mg | ORAL_TABLET | Freq: Two times a day (BID) | ORAL | Status: AC | PRN

## 2011-10-03 MED ORDER — IBUPROFEN 800 MG PO TABS: 800.0000 mg | ORAL_TABLET | Freq: Three times a day (TID) | ORAL | Status: AC

## 2011-10-03 MED ORDER — IBUPROFEN 800 MG PO TABS
800.0000 mg | ORAL_TABLET | Freq: Once | ORAL | Status: AC
  Administered 2011-10-03: 800 mg via ORAL
  Filled 2011-10-03: qty 1
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
## 2011-10-03 NOTE — ED Notes (Signed)
IN TO ASSESS PT . HE IS ON THE PHONE . UNABLE TO COMPLETE ASSESSMENT

## 2011-10-03 NOTE — Discharge Instructions (Signed)
Gregory Sosa the stiffness you are experiencing is in the trapezius muscle.  Follow up with a pcp from the list below of one of your choice if not better in several days.  Take ibuprofen 800mg  every 6 hours with food x 24 hours.  Take the muscle relaxor for comfort but do not drive with this.  Return to the ER for severe pain or head injury symptoms.
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
## 2011-10-04 NOTE — ED Provider Notes (Signed)
Medical screening examination/treatment/procedure(s) were performed by non-physician practitioner and as supervising physician I was immediately available for consultation/collaboration.   Dashea Mcmullan, MD 10/04/11 1656 

## 2012-04-11 ENCOUNTER — Encounter: Payer: Self-pay | Admitting: Internal Medicine

## 2012-06-19 ENCOUNTER — Ambulatory Visit (INDEPENDENT_AMBULATORY_CARE_PROVIDER_SITE_OTHER): Payer: BC Managed Care – PPO | Admitting: Internal Medicine

## 2012-06-19 ENCOUNTER — Encounter: Payer: Self-pay | Admitting: Internal Medicine

## 2012-06-19 VITALS — BP 156/98 | HR 73 | Temp 97.7°F | Wt 182.4 lb

## 2012-06-19 DIAGNOSIS — Z0000 Encounter for general adult medical examination without abnormal findings: Secondary | ICD-10-CM | POA: Insufficient documentation

## 2012-06-19 DIAGNOSIS — Z79899 Other long term (current) drug therapy: Secondary | ICD-10-CM

## 2012-06-19 DIAGNOSIS — E785 Hyperlipidemia, unspecified: Secondary | ICD-10-CM

## 2012-06-19 DIAGNOSIS — Z720 Tobacco use: Secondary | ICD-10-CM

## 2012-06-19 DIAGNOSIS — E119 Type 2 diabetes mellitus without complications: Secondary | ICD-10-CM

## 2012-06-19 DIAGNOSIS — F17200 Nicotine dependence, unspecified, uncomplicated: Secondary | ICD-10-CM

## 2012-06-19 DIAGNOSIS — I10 Essential (primary) hypertension: Secondary | ICD-10-CM

## 2012-06-19 LAB — LIPID PANEL
Cholesterol: 223 mg/dL — ABNORMAL HIGH (ref 0–200)
HDL: 44 mg/dL (ref >39)
LDL Cholesterol: 155 mg/dL — ABNORMAL HIGH (ref 0–99)
Total CHOL/HDL Ratio: 5.1 Ratio
Triglycerides: 119 mg/dL (ref <150)
VLDL: 24 mg/dL (ref 0–40)

## 2012-06-19 LAB — GLUCOSE, CAPILLARY: Glucose-Capillary: 123 mg/dL — ABNORMAL HIGH (ref 70–99)

## 2012-06-19 LAB — BASIC METABOLIC PANEL
BUN: 11 mg/dL (ref 6–23)
CO2: 26 mEq/L (ref 19–32)
Calcium: 10 mg/dL (ref 8.4–10.5)
Chloride: 104 mEq/L (ref 96–112)
Creat: 0.95 mg/dL (ref 0.50–1.35)
Glucose, Bld: 116 mg/dL — ABNORMAL HIGH (ref 70–99)
Potassium: 4.4 mEq/L (ref 3.5–5.3)
Sodium: 139 mEq/L (ref 135–145)

## 2012-06-19 LAB — POCT GLYCOSYLATED HEMOGLOBIN (HGB A1C): Hemoglobin A1C: 6.4

## 2012-06-19 LAB — PSA: PSA: 10.82 ng/mL — ABNORMAL HIGH (ref <=4.00)

## 2012-06-19 NOTE — Assessment & Plan Note (Signed)
BP Readings from Last 3 Encounters:  06/19/12 156/98  10/03/11 138/90  06/30/11 133/83    Lab Results  Component Value Date   NA 138 03/01/2011   K 3.8 03/01/2011   CREATININE 0.90 03/01/2011    Assessment:  Blood pressure control: mildly elevated  Progress toward BP goal:   deteriorated  Comments: His blood pressure was mildly elevated today. Patient has never got his blood pressure checked at home. He has not been taking amlodipine. He was emphasized that it is important for him to be on a blood pressure medicine given his blood pressure readings but he refused. Reschedule a followup appointment in one month for blood pressure recheck. Plan:  Medications:  He refused to be on treatment  Educational resources provided: brochure  Self management tools provided: other (see comments)  Other plans:  Reschedule a followup appointment in one month for blood pressure recheck.

## 2012-06-19 NOTE — Assessment & Plan Note (Signed)
HbA1C is 6.4 on diet and exercise. Congratulated him on good control! Continue to monitor for now.

## 2012-06-19 NOTE — Assessment & Plan Note (Signed)
He has not made up his mind to quit smoking. He was counseled on the harmful affects associated with tobacco use.

## 2012-06-19 NOTE — Patient Instructions (Addendum)
General Instructions: Please schedule a follow up appointment in 1 months for BP recheck. Please bring your medication bottles with your next appointment. Please take your medicines as prescribed. I will call you with your lab results if anything will be abnormal.    Treatment Goals:  Goals (1 Years of Data) as of 06/19/12         As of Today 10/03/11 06/30/11 03/21/11     Blood Pressure    . Blood Pressure < 140/90  156/98 138/90 133/83 148/94      Progress Toward Treatment Goals:  Treatment Goal 06/19/2012  Hemoglobin A1C at goal  Stop smoking smoking the same amount    Self Care Goals & Plans:  Self Care Goal 06/19/2012  Manage my medications take my medicines as prescribed  Monitor my health keep track of my blood pressure  Eat healthy foods eat foods that are low in salt  Be physically active find an activity I enjoy    Home Blood Glucose Monitoring 06/19/2012  Check my blood sugar once a day  When to check my blood sugar before meals     Care Management & Community Referrals:  Referral 06/19/2012  Referrals made for care management support none needed

## 2012-06-19 NOTE — Assessment & Plan Note (Signed)
Check lipid panel today 

## 2012-06-19 NOTE — Progress Notes (Signed)
Subjective:   Patient ID: Gregory Sosa male   DOB: 04-Aug-1960 52 y.o.   MRN: [id number]  HPI: 52 year old gentleman with past medical history significant for hypertension, prediabetes, tobacco use presents to the clinic for routine followup.  He denies any complaints today. He is not taking any medicines including amlodipine for his HTN. He strongly believes in diet and exercise- he runs every other day. Has not made up his mind to quit smoking yet. His brother was recently diagnosed with prostrate cancer- and he requests screening for that.   Past Medical History  Diagnosis Date  . Hypertension    No family history on file. History   Social History  . Marital Status: Married    Spouse Name: N/A    Number of Children: N/A  . Years of Education: N/A   Occupational History  . Not on file.   Social History Main Topics  . Smoking status: Current Every Day Smoker -- 0.50 packs/day    Types: Cigarettes  . Smokeless tobacco: Not on file  . Alcohol Use: Yes  . Drug Use: Not on file  . Sexually Active: Not on file   Other Topics Concern  . Not on file   Social History Narrative  . No narrative on file   Review of Systems: General: Denies fever, chills, diaphoresis, appetite change and fatigue. HEENT: Denies photophobia, eye pain, redness, hearing loss, ear pain, congestion, sore throat, rhinorrhea, sneezing, mouth sores, trouble swallowing, neck pain, neck stiffness and tinnitus. Respiratory: Denies SOB, DOE, cough, chest tightness, and wheezing. Cardiovascular: Denies to chest pain, palpitations and leg swelling. Gastrointestinal: Denies nausea, vomiting, abdominal pain, diarrhea, constipation, blood in stool and abdominal distention. Genitourinary: Denies dysuria, urgency, frequency, hematuria, flank pain and difficulty urinating. Musculoskeletal: Denies myalgias, back pain, joint swelling, arthralgias and gait problem.  Skin: Denies pallor, rash and wound. Neurological: Denies  dizziness, seizures, syncope, weakness, light-headedness, numbness and headaches. Hematological: Denies adenopathy, easy bruising, personal or family bleeding history. Psychiatric/Behavioral: Denies suicidal ideation, mood changes, confusion, nervousness, sleep disturbance and agitation.    Current Outpatient Medications: Current Outpatient Prescriptions  Medication Sig Dispense Refill  . amLODipine (NORVASC) 5 MG tablet Take 5 mg by mouth daily.        [name] aspirin 81 MG tablet Take 81 mg by mouth daily.         No current facility-administered medications for this visit.    Allergies: No Known Allergies    Objective:   Physical Exam: Filed Vitals:   06/19/12 1100  BP: 156/98  Pulse: 73  Temp: 97.7 F (36.5 C)    General: Vital signs reviewed and noted. Well-developed, well-nourished, in no acute distress; alert, appropriate and cooperative throughout examination. Head: Normocephalic, atraumatic Lungs: Normal respiratory effort. Clear to auscultation BL without crackles or wheezes. Heart: RRR. S1 and S2 normal without gallop, murmur, or rubs. Abdomen:BS normoactive. Soft, Nondistended, non-tender.  No masses or organomegaly. Extremities: No pretibial edema. Genitourinary exam: DRE was performed - no nodules or irregularity were felt on the prostate on rectal exam     Assessment & Plan:

## 2012-06-19 NOTE — Assessment & Plan Note (Signed)
His brother has been recently diagnosed with prostrate cancer. He was requesting screening for that. Patient was explained pros and cons for prostrate screening but he opted to go with screening. DRE was performed and no nodules or irregularity was felt on prostrate. PSA levels were also obtained -Patient was also offered foot exam and microalbumin to creatinine ratio for his prediabetes/diabetes but he refused.

## 2012-06-25 ENCOUNTER — Telehealth: Payer: Self-pay | Admitting: Internal Medicine

## 2012-06-25 DIAGNOSIS — Z0000 Encounter for general adult medical examination without abnormal findings: Secondary | ICD-10-CM

## 2012-06-25 NOTE — Telephone Encounter (Signed)
I called the patient to discuss his lab results but he was not available. Will try again.

## 2012-06-26 NOTE — Telephone Encounter (Signed)
Called again to discuss lab results. His PSA was elevated which could be falsely elevated from DRE as well but given a significant rise, I would repeat the test gain. If it persists to be elevated with future visit, would refer him to urologist.   I also discussed his elevated LDL to 155 mg/dl. I would target a goal to be 130 mg/dl in this patient given his elevated BP ( not willing to be on meds), pre- diabetes and smoking history . I offered him to be started on the medication ( statin) but he has not made up his mind.   Thanks, [employer]

## 2012-06-28 ENCOUNTER — Other Ambulatory Visit (INDEPENDENT_AMBULATORY_CARE_PROVIDER_SITE_OTHER): Payer: BC Managed Care – PPO

## 2012-06-28 DIAGNOSIS — Z0000 Encounter for general adult medical examination without abnormal findings: Secondary | ICD-10-CM

## 2012-06-28 DIAGNOSIS — E119 Type 2 diabetes mellitus without complications: Secondary | ICD-10-CM

## 2012-06-29 ENCOUNTER — Telehealth: Payer: Self-pay | Admitting: Internal Medicine

## 2012-06-29 DIAGNOSIS — R972 Elevated prostate specific antigen [PSA]: Secondary | ICD-10-CM

## 2012-06-29 LAB — PSA: PSA: 12.39 ng/mL — ABNORMAL HIGH (ref <=4.00)

## 2012-06-29 NOTE — Telephone Encounter (Signed)
First avail appt scheduled for Tues March 25 at 3:15pm at [facility] (Dr Patsi Sears). ZO#[phone] fax# [phone] located at [address]Kingsley Spittle Cassady2/21/20144:01 PM

## 2012-06-29 NOTE — Telephone Encounter (Signed)
Called patient to update him on his PSA results.. He was not available at home.  Called on his cell phone [phone] but he was unavailable>> left message.  Will do urology referral in the setting of his elevated PSA( repeated twice).

## 2012-07-02 NOTE — Telephone Encounter (Signed)
I called him again on his cell phone to update him on his appointment date and time with [facility]. Greatly appreciate Kaye's Help!   Gregory Sosa

## 2012-07-17 ENCOUNTER — Ambulatory Visit: Payer: Self-pay | Admitting: Internal Medicine

## 2012-07-19 ENCOUNTER — Ambulatory Visit (INDEPENDENT_AMBULATORY_CARE_PROVIDER_SITE_OTHER): Payer: BC Managed Care – PPO | Admitting: Internal Medicine

## 2012-07-19 ENCOUNTER — Encounter: Payer: Self-pay | Admitting: Internal Medicine

## 2012-07-19 VITALS — BP 161/92 | HR 71 | Temp 97.0°F

## 2012-07-19 DIAGNOSIS — R0789 Other chest pain: Secondary | ICD-10-CM

## 2012-07-19 DIAGNOSIS — I10 Essential (primary) hypertension: Secondary | ICD-10-CM

## 2012-07-19 DIAGNOSIS — E785 Hyperlipidemia, unspecified: Secondary | ICD-10-CM

## 2012-07-19 MED ORDER — PRAVASTATIN SODIUM 40 MG PO TABS: 40.0000 mg | ORAL_TABLET | Freq: Every day | ORAL | Status: DC

## 2012-07-19 MED ORDER — LISINOPRIL-HYDROCHLOROTHIAZIDE 10-12.5 MG PO TABS: 1.0000 | ORAL_TABLET | Freq: Every day | ORAL | Status: DC

## 2012-07-19 NOTE — Progress Notes (Signed)
Subjective:   Patient ID: Gregory Sosa male   DOB: 08/28/1960 52 y.o.   MRN: [id number]  HPI: [name] is a 52 y.o. man who presents to clinic today complaining of occasional chest pain over the last 2 weeks.    See Problem focused Assessment and Plan for full details of his chronic medical conditions including hypertesion and hyperlipidemia.    He notes that he has had a lot of stress at work and first noticed some chest pain 2 weeks ago.  It is in the left chest under the left pectoris.  He states that he has not problems at rest.  He is able to run most days up to 3 miles without chest pain but will occasionally get chest pain going up a flight of stairs.  He denies palpitations.  He states that he will getsome numbness in the hands during those episodes and that when he is anxious the pain is worse.  He has seen a cardiologist before and had completely clean cath in 2012.  He does note that he has been taking his HCTZ only occasionally.    Past Medical History  Diagnosis Date  . Hypertension    Current Outpatient Prescriptions  Medication Sig Dispense Refill  . amLODipine (NORVASC) 5 MG tablet Take 5 mg by mouth daily.        [name] aspirin 81 MG tablet Take 81 mg by mouth daily.         No current facility-administered medications for this visit.   No family history on file. History   Social History  . Marital Status: Married    Spouse Name: N/A    Number of Children: N/A  . Years of Education: N/A   Social History Main Topics  . Smoking status: Current Every Day Smoker -- 0.50 packs/day    Types: Cigarettes  . Smokeless tobacco: Not on file  . Alcohol Use: Yes  . Drug Use: Not on file  . Sexually Active: Not on file   Other Topics Concern  . Not on file   Social History Narrative  . No narrative on file   Review of Systems: A full 12 system ROS is negative except as noted in the HPI and A&P.   Objective:  Physical Exam: Filed Vitals:   07/19/12 0954  BP:  161/92  Pulse: 71  Temp: 97 F (36.1 C)  TempSrc: Oral   Constitutional: Vital signs reviewed.  Patient is a well-developed and well-nourished man in no acute distress and cooperative with exam. Alert and oriented x3.  Head: Normocephalic and atraumatic Ear: TM normal bilaterally Mouth: no erythema or exudates, MMM Eyes: PERRL, EOMI, conjunctivae normal, No scleral icterus.  Neck: Supple, Trachea midline normal ROM, No JVD, mass, thyromegaly, or carotid bruit present.  Cardiovascular: no pain to palpation of the chest.  RRR, S1 normal, S2 normal, no MRG, pulses symmetric and intact bilaterally Pulmonary/Chest: CTAB, no wheezes, rales, or rhonchi Abdominal: Soft. Non-tender, non-distended, bowel sounds are normal, no masses, organomegaly, or guarding present.  GU: no CVA tenderness Musculoskeletal: No joint deformities, erythema, or stiffness, ROM full and no nontender Hematology: no cervical, inginal, or axillary adenopathy.  Neurological: A&O x3, Strength is normal and symmetric bilaterally, cranial nerve II-XII are grossly intact, no focal motor deficit, sensory intact to light touch bilaterally.  Skin: Warm, dry and intact. No rash, cyanosis, or clubbing.  Psychiatric: anxious mood and affect. speech and behavior is normal. Judgment and thought content normal. Cognition and memory are  normal.   Assessment & Plan:

## 2012-07-19 NOTE — Patient Instructions (Addendum)
1.  Start Lisinopril/HCTZ 10-12.5 mg tablets.  Take 1 tablet daily for your blood pressure.  2.  Start Pravastatin 40 mg tablets.  Take tablet daily for your cholesterol.  3.  Remember the breathing exercises below to help with your stress.   4.  If the chest pain gets worse or you get sweating, numbness, weakness, or pain that goes down the left arm or into the neck you need to be evaluated right away.  5.  Remember your Urology appointment coming up.  Make sure they send [place] a copy of the visit note for our records.  6.  Follow up with [place] in 2 weeks to recheck the blood pressure.

## 2012-08-02 ENCOUNTER — Encounter: Payer: Self-pay | Admitting: Internal Medicine

## 2012-08-02 ENCOUNTER — Ambulatory Visit (INDEPENDENT_AMBULATORY_CARE_PROVIDER_SITE_OTHER): Payer: BC Managed Care – PPO | Admitting: Internal Medicine

## 2012-08-02 VITALS — BP 135/86 | HR 73 | Temp 95.3°F | Ht 66.0 in | Wt 179.7 lb

## 2012-08-02 DIAGNOSIS — Z720 Tobacco use: Secondary | ICD-10-CM

## 2012-08-02 DIAGNOSIS — I10 Essential (primary) hypertension: Secondary | ICD-10-CM

## 2012-08-02 DIAGNOSIS — F17200 Nicotine dependence, unspecified, uncomplicated: Secondary | ICD-10-CM

## 2012-08-02 DIAGNOSIS — R972 Elevated prostate specific antigen [PSA]: Secondary | ICD-10-CM

## 2012-08-02 DIAGNOSIS — R0789 Other chest pain: Secondary | ICD-10-CM

## 2012-08-02 DIAGNOSIS — E119 Type 2 diabetes mellitus without complications: Secondary | ICD-10-CM

## 2012-08-02 HISTORY — DX: Elevated prostate specific antigen (PSA): R97.20

## 2012-08-02 LAB — BASIC METABOLIC PANEL
BUN: 13 mg/dL (ref 6–23)
CO2: 28 mEq/L (ref 19–32)
Calcium: 11 mg/dL — ABNORMAL HIGH (ref 8.4–10.5)
Chloride: 99 mEq/L (ref 96–112)
Creat: 1 mg/dL (ref 0.50–1.35)
Glucose, Bld: 129 mg/dL — ABNORMAL HIGH (ref 70–99)
Potassium: 4.4 mEq/L (ref 3.5–5.3)
Sodium: 134 mEq/L — ABNORMAL LOW (ref 135–145)

## 2012-08-02 NOTE — Progress Notes (Signed)
  Subjective:   Patient ID: Gregory Sosa male   DOB: [birth year] 52 y.o.   MRN: [id number]  HPI: [name] is a 52 y.o. man who presents to clinic today for follow up from his last appointment.  See Problem focused Assessment and Plan for full details of his chronic medical conditions including tobacco abuse, hypertension, and diabetes.    He states that his chest pain is better and his work stress is less.  He states that he is running between 3-5 miles per day with no chest.    He has history of elevated PSA around 12.8.  He has yet to follow up with his urologist.  He denies weight loss, blood in his urine, or problems urinating.  He has no family history that he knows of for prostate cancer.    Past Medical History  Diagnosis Date  . Hypertension    Current Outpatient Prescriptions  Medication Sig Dispense Refill  . aspirin 81 MG tablet Take 81 mg by mouth daily.        [name] lisinopril-hydrochlorothiazide (PRINZIDE,ZESTORETIC) 10-12.5 MG per tablet Take 1 tablet by mouth daily.  30 tablet  3  . pravastatin (PRAVACHOL) 40 MG tablet Take 1 tablet (40 mg total) by mouth daily.  30 tablet  3   No current facility-administered medications for this visit.   No family history on file. History   Social History  . Marital Status: Married    Spouse Name: N/A    Number of Children: N/A  . Years of Education: N/A   Social History Main Topics  . Smoking status: Current Every Day Smoker -- 0.50 packs/day    Types: Cigarettes  . Smokeless tobacco: None  . Alcohol Use: Yes  . Drug Use: None  . Sexually Active: None   Other Topics Concern  . None   Social History Narrative  . None   Review of Systems: A full 12 system ROS is negative except as noted in the HPI and A&P.   Objective:  Physical Exam: Filed Vitals:   08/02/12 1055  BP: 135/86  Pulse: 73  Temp: 95.3 F (35.2 C)  TempSrc: Oral  Height: 5\' 6"  (1.676 m)  Weight: 179 lb 11.2 oz (81.511 kg)  SpO2: 98%    Constitutional: Vital signs reviewed.  Patient is a well-developed and well-nourished man in no acute distress and cooperative with exam. Alert and oriented x3.  Head: Normocephalic and atraumatic Ear: TM normal bilaterally Mouth: no erythema or exudates, MMM Eyes: PERRL, EOMI, conjunctivae normal, No scleral icterus.  Neck: Supple, Trachea midline normal ROM, No JVD, mass, thyromegaly, or carotid bruit present.  Cardiovascular: RRR, S1 normal, S2 normal, no MRG, pulses symmetric and intact bilaterally Pulmonary/Chest: CTAB, no wheezes, rales, or rhonchi Abdominal: Soft. Non-tender, non-distended, bowel sounds are normal, no masses, organomegaly, or guarding present.  GU: no CVA tenderness, rectal exam deferred today.   Musculoskeletal: No joint deformities, erythema, or stiffness, ROM full and no nontender Hematology: no cervical, inginal, or axillary adenopathy.  Neurological: A&O x3, Strength is normal and symmetric bilaterally, cranial nerve II-XII are grossly intact, no focal motor deficit, sensory intact to light touch bilaterally.  Skin: Warm, dry and intact. No rash, cyanosis, or clubbing.  Psychiatric: Normal mood and affect. speech and behavior is normal. Judgment and thought content normal. Cognition and memory are normal.   Assessment & Plan:

## 2012-08-02 NOTE — Patient Instructions (Signed)
1.  Stop in the lab to have your blood drawn and leave the urine sample to check for protein in the urine.  If we need to discuss anything before your follow up I will call you.  If you don't hear from me, no news is good news.  2.  Continue the medications as prescribed.  3.  Work on quitting smoking.  It is the number 1 best thing you can do for your health.  Remember smoking is both an addiction as well as a habit.  - Set a place to smoke and only smoke in that place, usually that is outside.  This makes smoking a conscious act that you have to do and gives you a chance to say no  - Start by delaying the 1st cigarette in the morning by 30 minutes.  Do that for 3-4 days then extend it to 45 minutes etc.  4.  Follow up in 2 months to see how things are going.  5. Make sure that you make your follow up appointment with the Urologist to check on your prostate.

## 2012-08-03 LAB — MICROALBUMIN / CREATININE URINE RATIO
Creatinine, Urine: 110 mg/dL
Microalb Creat Ratio: 4.5 mg/g (ref 0.0–30.0)
Microalb, Ur: 0.5 mg/dL (ref 0.00–1.89)

## 2012-09-28 ENCOUNTER — Ambulatory Visit: Payer: Self-pay | Admitting: Internal Medicine

## 2012-10-03 ENCOUNTER — Telehealth: Payer: Self-pay | Admitting: *Deleted

## 2012-10-03 ENCOUNTER — Encounter (HOSPITAL_COMMUNITY): Payer: Self-pay | Admitting: *Deleted

## 2012-10-03 ENCOUNTER — Emergency Department (HOSPITAL_COMMUNITY)
Admission: EM | Admit: 2012-10-03 | Discharge: 2012-10-03 | Disposition: A | Discharge status: Home / Self Care | Payer: BC Managed Care – PPO | Source: Home / Self Care

## 2012-10-03 DIAGNOSIS — S50862A Insect bite (nonvenomous) of left forearm, initial encounter: Secondary | ICD-10-CM

## 2012-10-03 DIAGNOSIS — W57XXXA Bitten or stung by nonvenomous insect and other nonvenomous arthropods, initial encounter: Secondary | ICD-10-CM

## 2012-10-03 DIAGNOSIS — IMO0001 Reserved for inherently not codable concepts without codable children: Secondary | ICD-10-CM

## 2012-10-03 MED ORDER — DOXYCYCLINE HYCLATE 100 MG PO CAPS: ORAL_CAPSULE | ORAL | Status: DC

## 2012-10-03 NOTE — Telephone Encounter (Signed)
Agree with that.  Thanks, [employer]

## 2012-10-03 NOTE — ED Provider Notes (Signed)
History     CSN: [id number]  Arrival date & time 10/03/12  1104   First MD Initiated Contact with Patient 10/03/12 1303      Chief Complaint  Patient presents with  . Tick Removal    (Consider location/radiation/quality/duration/timing/severity/associated sxs/prior treatment) HPI Comments: 52 year old male removed a tick from his left volar wrist 3 days ago. The next day he had any of mild swelling and induration with redness and itching of approximately 4 x 3 cm. Today the area is much smaller erythematous has nearly abated and there is little to no swelling. States the tick was an approximate size of a pencil eraser or smaller and was minimaly engorged. He denies systemic symptoms or rash. He states he just wants to get it checked out.   Past Medical History  Diagnosis Date  . Hypertension     History reviewed. No pertinent past surgical history.  History reviewed. No pertinent family history.  History  Substance Use Topics  . Smoking status: Current Every Day Smoker -- 0.50 packs/day    Types: Cigarettes  . Smokeless tobacco: Not on file  . Alcohol Use: Yes      Review of Systems  Constitutional: Negative for fever, chills, diaphoresis, activity change, appetite change and fatigue.  HENT: Negative.   Respiratory: Negative.   Musculoskeletal: Negative for myalgias, joint swelling and arthralgias.  Skin: Negative for pallor and rash.       See history of present illness  Neurological: Negative.   All other systems reviewed and are negative.    Allergies  Review of patient's allergies indicates no known allergies.  Home Medications   Current Outpatient Rx  Name  Route  Sig  Dispense  Refill  . aspirin 81 MG tablet   Oral   Take 81 mg by mouth daily.           [name] doxycycline (VIBRAMYCIN) 100 MG capsule      Take 2 capsules today by mouth   2 capsule   0   . lisinopril-hydrochlorothiazide (PRINZIDE,ZESTORETIC) 10-12.5 MG per tablet   Oral   Take 1  tablet by mouth daily.   30 tablet   3   . pravastatin (PRAVACHOL) 40 MG tablet   Oral   Take 1 tablet (40 mg total) by mouth daily.   30 tablet   3     BP 135/83  Pulse 64  Temp(Src) 98.1 F (36.7 C) (Oral)  Resp 16  SpO2 100%  Physical Exam  Nursing note and vitals reviewed. Constitutional: He is oriented to person, place, and time. He appears well-developed. No distress.  Eyes: Conjunctivae and EOM are normal.  Neck: Normal range of motion.  Cardiovascular: Normal rate.   Pulmonary/Chest: Effort normal. No respiratory distress.  Musculoskeletal: Normal range of motion. He exhibits no edema and no tenderness.  Neurological: He is alert and oriented to person, place, and time. He exhibits normal muscle tone.  Skin: Skin is warm and dry.  There is a 5 mm diameter area of induration where the tick was removed. Surrounding this area is slight puffiness another centimeter surrounding the bite area. The initial area of swelling was markedly name p.m. by his wife and is now significantly smaller. No erythema, no drainage. No lymphangitis, increased warmth or other signs of infection.  Psychiatric: He has a normal mood and affect.    ED Course  Procedures (including critical care time)  Labs Reviewed - No data to display No results found.  1. Tick bite of forearm, left, initial encounter       MDM  Because we are uncertain as to the type of tick and the fact that it had mild engorgement of the hitting treat with a prophylactic dose of doxycycline 200 mg by mouth. It is within the window of 36 hours. He has no constitutional symptoms or rash. He is instructed that if he develops flulike symptoms, rash, fever or other illness he is to seek medical attention promptly. He received information regarding Lyme disease in [facility] spotted fever.        Hayden Rasmussen, NP 10/03/12 1330

## 2012-10-03 NOTE — Telephone Encounter (Signed)
Pt calls and states sun or mon he was bitten by an insect and he had to pull part of the insect out of his skin, the area is now swollen, red and painful. He is advised to go to urg care asap and is agreeable

## 2012-10-03 NOTE — ED Notes (Signed)
Pt   STATES  HE  PLUCKED  A  TICK  FROM  L  WRIST  AREA    sev  Days  Ago  And  Now  The  Area  Is   Swollen  Tender  And  Red   And  Hard  To  Touch   - he  denys  Any  Systemic  Symptoms  Such as  Fever  Or       Body  Aches

## 2012-10-03 NOTE — Telephone Encounter (Signed)
OK, agree

## 2012-10-04 NOTE — ED Notes (Signed)
Patient had questions about antibiotic dosing.  Verified with david, np. Medication dosed appropriately.  Patient requesting more medicine, explained he would need to be seen .  Bump does not look any better, just seems worse

## 2012-10-04 NOTE — ED Provider Notes (Signed)
Medical screening examination/treatment/procedure(s) were performed by non-physician practitioner and as supervising physician I was immediately available for consultation/collaboration.   [facility] MD  Sharin Grave, MD 10/04/12 1535

## 2012-10-09 ENCOUNTER — Ambulatory Visit (INDEPENDENT_AMBULATORY_CARE_PROVIDER_SITE_OTHER): Payer: BC Managed Care – PPO | Admitting: Internal Medicine

## 2012-10-09 ENCOUNTER — Encounter: Payer: Self-pay | Admitting: Internal Medicine

## 2012-10-09 VITALS — BP 127/85 | HR 68 | Temp 96.6°F | Ht 66.0 in | Wt 180.3 lb

## 2012-10-09 DIAGNOSIS — K219 Gastro-esophageal reflux disease without esophagitis: Secondary | ICD-10-CM

## 2012-10-09 DIAGNOSIS — T148 Other injury of unspecified body region: Secondary | ICD-10-CM

## 2012-10-09 DIAGNOSIS — E119 Type 2 diabetes mellitus without complications: Secondary | ICD-10-CM

## 2012-10-09 DIAGNOSIS — R972 Elevated prostate specific antigen [PSA]: Secondary | ICD-10-CM

## 2012-10-09 DIAGNOSIS — W57XXXA Bitten or stung by nonvenomous insect and other nonvenomous arthropods, initial encounter: Secondary | ICD-10-CM

## 2012-10-09 DIAGNOSIS — I10 Essential (primary) hypertension: Secondary | ICD-10-CM

## 2012-10-09 HISTORY — DX: Gastro-esophageal reflux disease without esophagitis: K21.9

## 2012-10-09 LAB — GLUCOSE, CAPILLARY: Glucose-Capillary: 173 mg/dL — ABNORMAL HIGH (ref 70–99)

## 2012-10-09 LAB — POCT GLYCOSYLATED HEMOGLOBIN (HGB A1C): Hemoglobin A1C: 6.7

## 2012-10-09 MED ORDER — PANTOPRAZOLE SODIUM 40 MG PO TBEC: 40.0000 mg | DELAYED_RELEASE_TABLET | Freq: Every day | ORAL | Status: DC

## 2012-10-09 MED ORDER — HYDROCHLOROTHIAZIDE 25 MG PO TABS: 25.0000 mg | ORAL_TABLET | Freq: Every day | ORAL | Status: DC

## 2012-10-09 NOTE — Progress Notes (Signed)
HPI The patient is a 52 y.o. male with a history of DM, HTN, HL, presenting for an ED follow-up visit.  The patient notes a tick bite last week.  He went to ED on 5/28, and was given doxycycline x1 dose.  Since then the area has decreased in size somewhat, but itching has persisted.  The patient notes no fevers, muscle aches, fatigue.  The patient also notes symptoms of heartburn, described as an epigastric and left chest burning sensation.  This typically occurs around mid-day, after lunch, worse when walking around, not relieved by rest, at its worst in a seated position.  The sensation is relieved by belching.  Symptoms have been relieved by TUMS.  The patient has a history of HTN.  He was recently prescribed lisinopril-HCTZ, but stopped taking this due to cough.  He has been taking his wife's HCTZ instead.  BP is well-controlled today.  The patient has a history of DM, diet-controlled.  A1C has increased slightly to 6.7 today.  ROS: General: no fevers, chills, changes in weight, changes in appetite Skin: no rash HEENT: no blurry vision, hearing changes, sore throat Pulm: no dyspnea, coughing, wheezing CV: no palpitations, shortness of breath Abd: no nausea/vomiting, diarrhea/constipation GU: no dysuria, hematuria, polyuria Ext: no arthralgias, myalgias Neuro: no weakness, numbness, or tingling  Filed Vitals:   10/09/12 0824  BP: 127/85  Pulse: 68  Temp: 96.6 F (35.9 C)    PEX General: alert, cooperative, and in no apparent distress HEENT: pupils equal round and reactive to light, vision grossly intact, oropharynx clear and non-erythematous  Neck: supple, no lymphadenopathy Lungs: clear to ascultation bilaterally, normal work of respiration, no wheezes, rales, ronchi Heart: regular rate and rhythm, no murmurs, gallops, or rubs Abdomen: soft, non-tender, non-distended, normal bowel sounds Extremities: Left forearm with approx 2 cm x 2 cm area of inflammation, with  hyperpigmentation, with no warmth, erythema, erythema, tenderness, or fluctuance Neurologic: alert & oriented X3, cranial nerves II-XII intact, strength grossly intact, sensation intact to light touch  Current Outpatient Prescriptions on File Prior to Visit  Medication Sig Dispense Refill  . aspirin 81 MG tablet Take 81 mg by mouth daily.        [name] doxycycline (VIBRAMYCIN) 100 MG capsule Take 2 capsules today by mouth  2 capsule  0  . lisinopril-hydrochlorothiazide (PRINZIDE,ZESTORETIC) 10-12.5 MG per tablet Take 1 tablet by mouth daily.  30 tablet  3  . pravastatin (PRAVACHOL) 40 MG tablet Take 1 tablet (40 mg total) by mouth daily.  30 tablet  3   No current facility-administered medications on file prior to visit.    Assessment/Plan

## 2012-10-09 NOTE — Progress Notes (Signed)
Case discussed with Dr. Manson Passey immediately after the resident saw the patient.  We reviewed the resident's history and exam and pertinent patient test results.  I agree with the assessment, diagnosis and plan of care documented in the resident's note.  He will be started on a trial of protonix.  After 4-6 weeks, can consider decreasing the protonix to the lowest effective dose, basically doing a "ramping down" on therapy.  He should be maintained on no therapy or the lowest possible dose of PPI that is effective.

## 2012-10-09 NOTE — Patient Instructions (Addendum)
General Instructions: The tick bite on your arm appears to be healing well.  It may take up to 4 weeks for the area to heal completely.  Please seek medical attention if you develop a rash, muscle aches, joint aches, fevers, or severe fatigue.  Your stomach and chest pain likely represents Acid Reflux.  Start taking Protonix, 1 tablet every morning on an empty stomach, 30-60 minutes before eating.  You may also take TUMS throughout the day as needed for heartburn.  If your symptoms don't improve after 4-6 weeks, please return for evaluation.  Please return for a follow-up visit in 3 months.   Treatment Goals:  Goals (1 Years of Data) as of 10/09/12         As of Today 10/03/12 08/02/12 07/19/12 06/19/12     Blood Pressure    . Blood Pressure < 140/90  127/85 135/83 135/86 161/92 156/98      Progress Toward Treatment Goals:  Treatment Goal 10/09/2012  Hemoglobin A1C at goal  Blood pressure at goal  Stop smoking smoking the same amount    Self Care Goals & Plans:  Self Care Goal 08/02/2012  Manage my medications take my medicines as prescribed; bring my medications to every visit; refill my medications on time; follow the sick day instructions if I am sick  Monitor my health keep track of my blood pressure; keep track of my weight; check my feet daily  Eat healthy foods eat more vegetables; eat fruit for snacks and desserts; eat foods that are low in salt; drink diet soda or water instead of juice or soda  Be physically active take a walk every day; find an activity I enjoy  Stop smoking -    Home Blood Glucose Monitoring 06/19/2012  Check my blood sugar once a day  When to check my blood sugar before meals     Care Management & Community Referrals:  Referral 06/19/2012  Referrals made for care management support none needed

## 2012-10-09 NOTE — Assessment & Plan Note (Addendum)
BP Readings from Last 3 Encounters:  10/09/12 127/85  10/03/12 135/83  08/02/12 135/86    Lab Results  Component Value Date   NA 134* 08/02/2012   K 4.4 08/02/2012   CREATININE 1.00 08/02/2012    Assessment: Blood pressure control: controlled Progress toward BP goal:  at goal Comments: The patient has a history of HTN.  He stopped lisinopril due to cough.  BP well-controlled on HCTZ monotherapy.  Plan: Medications:  Stop lisinopril, continue HCTZ Educational resources provided:   Self management tools provided:   Other plans:

## 2012-10-09 NOTE — Assessment & Plan Note (Signed)
Lab Results  Component Value Date   HGBA1C 6.7 10/09/2012   HGBA1C 6.4 06/19/2012   HGBA1C 6.5 06/30/2011     Assessment: Diabetes control: good control (HgbA1C at goal) Progress toward A1C goal:  at goal Comments: A1C mildly higher at 6.7 today  Plan: Medications:  Continue diet/exercise Home glucose monitoring: Frequency:   Timing:   Instruction/counseling given: discussed foot care Educational resources provided:   Self management tools provided:   Other plans: Encouraged patient to get an eye exam

## 2012-10-09 NOTE — Assessment & Plan Note (Signed)
The patient had an elevated PSA in feb 2014.  He was referred to Urology.  He states he had an appointment, but cancelled it.  He believes his initial results may have been false positives, due to sexual activity prior to testing.  He would like to abstain from sex, and retry testing at his next visit, then discuss urology follow-up. -recheck PSA at next visit

## 2012-10-09 NOTE — Assessment & Plan Note (Signed)
The patient notes a tick bite on his left forearm about 1 week ago, for which he was seen in the ED and given 1 dose of doxycycline.  The area has had persistent inflammation and itching, though no erythema or drainage.  The patient notes no fevers, myalgias, arthralgias, or rash.  The area likely represents inflammation following skin injury, with no active signs of inflammation.  Patient was cautioned to watch for signs of fever, myalgias, arthralgias, or rash, and re-present to medical care if these develop. -continue benadryl prn for itching

## 2012-10-09 NOTE — Assessment & Plan Note (Signed)
The patient presents with symptoms of GERD.  He had a clean cath 02/2011, making angina/CAD unlikely. -start protonix 40 mg daily -continue tums prn

## 2012-10-17 ENCOUNTER — Encounter: Payer: Self-pay | Admitting: Dietician

## 2012-11-04 ENCOUNTER — Encounter: Payer: Self-pay | Admitting: Internal Medicine

## 2012-11-04 NOTE — Assessment & Plan Note (Signed)
Has resolved.  Will continue to monitor and adjust risk factors for CAD.

## 2012-11-04 NOTE — Assessment & Plan Note (Signed)
He had a completely normal cath in 2012 and his chest pain is very atypical.  He states that he is under stress.  This is likely the cause of his chest pain.  We will monitor for now and work on breathing techniques for relaxation that we practiced in the clinic today.  HE was advised to come to the ED immediately if he had chest pain that does not abate.

## 2012-11-04 NOTE — Assessment & Plan Note (Signed)
AT his last appointment we changed him to lisinopril/HCTZ.  HE states he has been taking the medication without side effects.  Basic Metabolic Panel:    Component Value Date/Time   NA 134* 08/02/2012 1120   K 4.4 08/02/2012 1120   CL 99 08/02/2012 1120   CO2 28 08/02/2012 1120   BUN 13 08/02/2012 1120   CREATININE 1.00 08/02/2012 1120   CREATININE 0.90 03/01/2011 0400   GLUCOSE 129* 08/02/2012 1120   CALCIUM 11.0* 08/02/2012 1120   His creatinine is unchanged so we will continue his medication as prescribed.  WE will also check a microalbumin/creatinine ratio.

## 2012-11-04 NOTE — Assessment & Plan Note (Signed)
He is still smoking around 1/2 ppd.  We discussed several method for quitting today including setting a place to smoke and delaying his first cigarette every morning.  He is motivated to quit and I encouraged him to call [phone] if he needs any help. He declined nicotine patches today.

## 2012-11-04 NOTE — Assessment & Plan Note (Signed)
HE states that he has not been taking his medication.    Lab Results  Component Value Date   CHOL 223* 06/19/2012   CHOL 174 02/28/2011   Lab Results  Component Value Date   HDL 44 06/19/2012   HDL 43 16/02/9603   Lab Results  Component Value Date   LDLCALC 155* 06/19/2012   LDLCALC 124* 02/28/2011   Lab Results  Component Value Date   TRIG 119 06/19/2012   TRIG 35 02/28/2011   Lab Results  Component Value Date   CHOLHDL 5.1 06/19/2012   CHOLHDL 4.0 02/28/2011   No results found for this basename: LDLDIRECT   In February his LDL was too high. With his status as a diabetic we will start pravastatin 40 mg daily.

## 2012-11-04 NOTE — Assessment & Plan Note (Signed)
He has only been taking his HCTZ intermittently.  We will have him start lisinopril/HCTZ for his blood pressure to get him on a good regimen for someone with diabetes.

## 2012-11-04 NOTE — Assessment & Plan Note (Signed)
He has elevated PSA today and states that he has his follow up appointment with urology next week.  I encouraged him to keep the appointment and have them send [place] a copy of the report.

## 2012-11-04 NOTE — Assessment & Plan Note (Signed)
He states that he has been doing better watching his diet.  His last A1C was well controlled.    Lab Results  Component Value Date   HGBA1C 6.4 06/19/2012   HGBA1C 6.5 06/30/2011   Lab Results  Component Value Date   MICROALBUR 0.50 08/02/2012   LDLCALC 155* 06/19/2012   CREATININE 1.00 08/02/2012   He is due for a recheck in the middle of April.  We will have him follow up at that time for repeat A1C.

## 2012-11-15 ENCOUNTER — Other Ambulatory Visit: Payer: Self-pay

## 2013-03-14 ENCOUNTER — Other Ambulatory Visit: Payer: Self-pay

## 2013-05-07 ENCOUNTER — Telehealth: Payer: Self-pay | Admitting: *Deleted

## 2013-05-07 NOTE — Telephone Encounter (Signed)
Pt called - since holidays - CBG are higher then usual - 150-250. Has been control by diet and is on no med for diabetes. Pt wants appt 05/14/13 - Dr Burtis Junes 05/14/13 8:15AM and to bring meter. Stanton Kidney Ahnya Akre RN 05/07/13 3PM

## 2013-05-14 ENCOUNTER — Encounter: Payer: Self-pay | Admitting: Internal Medicine

## 2013-05-14 ENCOUNTER — Telehealth: Payer: Self-pay | Admitting: Dietician

## 2013-05-14 ENCOUNTER — Ambulatory Visit (INDEPENDENT_AMBULATORY_CARE_PROVIDER_SITE_OTHER): Payer: No Typology Code available for payment source | Admitting: Internal Medicine

## 2013-05-14 VITALS — BP 134/81 | HR 88 | Temp 97.1°F | Ht 66.0 in | Wt 167.4 lb

## 2013-05-14 DIAGNOSIS — R972 Elevated prostate specific antigen [PSA]: Secondary | ICD-10-CM

## 2013-05-14 DIAGNOSIS — E785 Hyperlipidemia, unspecified: Secondary | ICD-10-CM

## 2013-05-14 DIAGNOSIS — I10 Essential (primary) hypertension: Secondary | ICD-10-CM

## 2013-05-14 DIAGNOSIS — E119 Type 2 diabetes mellitus without complications: Secondary | ICD-10-CM

## 2013-05-14 LAB — POCT GLYCOSYLATED HEMOGLOBIN (HGB A1C): Hemoglobin A1C: >14

## 2013-05-14 LAB — GLUCOSE, CAPILLARY: Glucose-Capillary: 206 mg/dL — ABNORMAL HIGH (ref 70–99)

## 2013-05-14 MED ORDER — METFORMIN HCL 500 MG PO TABS
500.0000 mg | ORAL_TABLET | Freq: Every day | ORAL | Status: DC
Start: 2013-05-14 — End: 2013-06-17

## 2013-05-14 NOTE — Assessment & Plan Note (Addendum)
Patient most recent lipid panel was 2/14 that showed an LDL of 155. Patient is very adamant and would not like to start medication at this time. -Extensive education in terms of high-risk for cardiac disease given diabetes and hyperlipidemia along with the best outcome for risk reduction would be managing hyperlipidemia was discussed with the patient the patient still refused to start medication -Supplemental reading material regarding lifestyle modifications for hyperlipidemia was provided -Followup with subsequent visits

## 2013-05-14 NOTE — Progress Notes (Signed)
   Subjective:    Patient ID: Gregory Sosa, male    DOB: Jul 28, 1960, 53 y.o.   MRN: [id number]  HPI  Gregory Sosa is a 53 year old with a past medical history as listed below who comes in for blood sugar check.  Patient states that over the holiday season he is had some dietary indulgences including consistent ingestion of sodas and sweets. During this time he has had increased polydipsia and polyuria and then begin checking his CBGs with his meter. He apparently shares his meter with his wife therefore many of the readings are not his. But when he does remember is that most of his CBGs are anywhere between the 2-400 range. He's not had any blurry vision. The patient is currently trying diet and exercise for lifestyle modifications in terms of managing and avoiding medications for his diabetes.  Review of Systems  Constitutional: Negative for fever, chills, activity change, appetite change and fatigue.  Respiratory: Negative for cough and shortness of breath.   Cardiovascular: Negative for chest pain and palpitations.  Gastrointestinal: Negative for nausea, vomiting and diarrhea.  Endocrine: Positive for polydipsia and polyuria.  Genitourinary: Positive for frequency.  Musculoskeletal: Negative for arthralgias and myalgias.    Past Medical History  Diagnosis Date  . Hypertension        Objective:   Physical Exam Filed Vitals:   05/14/13 0824  BP: 134/81  Pulse: 88  Temp: 97.1 F (36.2 C)   General: sitting in chair, NAD HEENT: PERRL, EOMI, no scleral icterus Cardiac: RRR, no rubs, murmurs or gallops Pulm: clear to auscultation bilaterally, moving normal volumes of air Abd: soft, nontender, nondistended, BS present Ext: warm and well perfused, no pedal edema Neuro: alert and oriented X3, cranial nerves II-XII grossly intact      Assessment & Plan:  Please see problem oriented charting.  Patient discussed with Dr. Meredith PelJoines

## 2013-05-14 NOTE — Assessment & Plan Note (Addendum)
Patient current hemoglobin A1c greater than 14 when previously 6.7. Therefore at this point lifestyle modifications are not adequate enough in terms of managing the patient's diabetes. This was discussed with the patient who agreed to start medication. -Begin metformin titration with one week of 500 mg each bedtime and then the second week 500 mg twice a day -The patient will then followup in one month and we'll therefore need to increase and possibly need a secondary agent or the addition of insulin -supplemental information in terms of reading material including lifestyle modifications for diabetes was given to the patient -pt refused to have foot exam, eye exam, flu shot and diabetes education despite education regarding pts significant risk factors of diabetic complications given significant HgbA1c -Patient refused flu vaccine, diabetic retinal scan, and diabetic foot examinations today

## 2013-05-14 NOTE — Assessment & Plan Note (Addendum)
This was not directly addressed during this visit but upon chart review pt has slight increase in calcium (11) and elevated PSA therefore concern for malignancy vs increased calcium in setting of HCTZ. Pt had referral to urology and didn't keep that appointment.  -f/u with urology and patient for repeat labs

## 2013-05-14 NOTE — Patient Instructions (Signed)
It was nice to me today and I hope you're having a happy near.  In terms of your diabetes it appears that it is the time to start some medication.   We will begin with a medication called metformin 500 mg as follows below: 1.start with metformin 500 mg with dinner for one week 2. Then increase metformin to 500 mg for breakfast and 500 mg with dinner  Then we will recheck in one month to see how you're doing with your blood sugar management.

## 2013-05-14 NOTE — Assessment & Plan Note (Signed)
BP Readings from Last 3 Encounters:  05/14/13 134/81  10/09/12 127/85  10/03/12 135/83   Patient blood pressure under good control at this time we'll not need to make any new changes. -Continue current medication of HCTZ 25 mg daily

## 2013-05-15 NOTE — Telephone Encounter (Signed)
I have never seen this patient in clinic and am unaware of any eye exam results.  Thanks.

## 2013-05-15 NOTE — Telephone Encounter (Signed)
Date of eye exam was 03-12-13.  Dr. Dema SeverinGodwin's office to fax us the report.

## 2013-05-16 NOTE — Progress Notes (Signed)
Case discussed with Dr. Sadek soon after the resident saw the patient.  We reviewed the resident's history and exam and pertinent patient test results.  I agree with the assessment, diagnosis, and plan of care documented in the resident's note. 

## 2013-06-17 ENCOUNTER — Encounter: Payer: Self-pay | Admitting: Internal Medicine

## 2013-06-17 ENCOUNTER — Ambulatory Visit (INDEPENDENT_AMBULATORY_CARE_PROVIDER_SITE_OTHER): Payer: No Typology Code available for payment source | Admitting: Internal Medicine

## 2013-06-17 VITALS — BP 119/78 | HR 64 | Temp 97.8°F | Ht 66.0 in | Wt 169.1 lb

## 2013-06-17 DIAGNOSIS — E119 Type 2 diabetes mellitus without complications: Secondary | ICD-10-CM

## 2013-06-17 DIAGNOSIS — Z720 Tobacco use: Secondary | ICD-10-CM

## 2013-06-17 DIAGNOSIS — F17200 Nicotine dependence, unspecified, uncomplicated: Secondary | ICD-10-CM

## 2013-06-17 DIAGNOSIS — I10 Essential (primary) hypertension: Secondary | ICD-10-CM

## 2013-06-17 DIAGNOSIS — Z0000 Encounter for general adult medical examination without abnormal findings: Secondary | ICD-10-CM

## 2013-06-17 DIAGNOSIS — R972 Elevated prostate specific antigen [PSA]: Secondary | ICD-10-CM

## 2013-06-17 LAB — GLUCOSE, CAPILLARY: Glucose-Capillary: 197 mg/dL — ABNORMAL HIGH (ref 70–99)

## 2013-06-17 MED ORDER — GLIPIZIDE 5 MG PO TABS: 5.0000 mg | ORAL_TABLET | Freq: Every day | ORAL | Status: DC

## 2013-06-17 NOTE — Patient Instructions (Signed)
Thank you for your visit today. Please return to the internal medicine clinic in 2-3 month(s) or sooner if needed for a recheck of your blood sugars.  Your current medical regimen is effective;  continue present plan and all medications. We will add glipizide 5mg  to your diabetes regimen to be taken daily.  This has been sent to your pharmacy.  Please be sure to bring all of your medications with you to every visit.  Should you have any new or worsening symptoms, please be sure to call the clinic at [phone].  If you believe that you are suffering from a life threatening condition or one that may result in the loss of limb or function, then you should call 911 or proceed to the nearest Emergency Department.

## 2013-06-17 NOTE — Progress Notes (Signed)
Subjective:   Patient ID: Gregory Sosa male    DOB: September 05, 1960 53 y.o.    MRN: [id number] Health Maintenance Due: Health Maintenance Due  Topic Date Due  . Pneumococcal Polysaccharide Vaccine (##1) 01/02/1963  . Tetanus/tdap  01/02/1980  . Colonoscopy  01/02/2011  . Influenza Vaccine  12/07/2012    _________________________________________________  HPI: [name] is a 53 y.o. male here for a follow up visit for diabetes.  Pt has a PMH outlined below.  Please see problem-based charting assessment and plan note for further details of medical issues addressed at today's visit.  PMH: Past Medical History  Diagnosis Date  . Hypertension     Medications: Current Outpatient Prescriptions on File Prior to Visit  Medication Sig Dispense Refill  . aspirin 81 MG tablet Take 81 mg by mouth daily.        . hydrochlorothiazide (HYDRODIURIL) 25 MG tablet Take 1 tablet (25 mg total) by mouth daily.  30 tablet  11  . metFORMIN (GLUCOPHAGE) 500 MG tablet Take 1 tablet (500 mg total) by mouth daily with breakfast. Then increase to 500 mg twice a day 2nd week until you follow up  60 tablet  11  . pantoprazole (PROTONIX) 40 MG tablet Take 1 tablet (40 mg total) by mouth daily.  30 tablet  5   No current facility-administered medications on file prior to visit.    Allergies: No Known Allergies  FH: No family history on file.  SH: History   Social History  . Marital Status: Married    Spouse Name: N/A    Number of Children: N/A  . Years of Education: N/A   Social History Main Topics  . Smoking status: Former Smoker -- 0.50 packs/day    Types: Cigarettes    Quit date: 02/06/2013  . Smokeless tobacco: None     Comment: stopped 3 months ago  . Alcohol Use: Yes     Comment: Wine  . Drug Use: None  . Sexual Activity: None   Other Topics Concern  . None   Social History Narrative  . None    Review of Systems: Constitutional: Negative for fever, chills and weight loss.    Eyes: Negative for blurred vision.  Respiratory: Negative for cough and shortness of breath.  Cardiovascular: Negative for chest pain, palpitations and leg swelling.  Gastrointestinal: Negative for nausea, vomiting, abdominal pain, diarrhea, constipation and blood in stool.  Genitourinary: Negative for dysuria, urgency and frequency.  Musculoskeletal: Negative for myalgias and back pain.  Neurological: Negative for dizziness, weakness and headaches.     Objective:   Vital Signs: Filed Vitals:   06/17/13 1437  BP: 119/78  Pulse: 64  Temp: 97.8 F (36.6 C)  TempSrc: Oral  Height: 5\' 6"  (1.676 m)  Weight: 169 lb 1.6 oz (76.703 kg)  SpO2: 99%      BP Readings from Last 3 Encounters:  06/17/13 119/78  05/14/13 134/81  10/09/12 127/85    Physical Exam: Constitutional: Vital signs reviewed.  Patient is well-developed and well-nourished in NAD and cooperative with exam.  Head: Normocephalic and atraumatic. Eyes: PERRL, EOMI, conjunctivae nl, no scleral icterus.  Neck: Supple. Cardiovascular: RRR, no MRG. Pulmonary/Chest: normal effort, non-tender to palpation, CTAB, no wheezes, rales, or rhonchi. Abdominal: Soft. NT/ND +BS. Neurological: A&O x3, cranial nerves II-XII are grossly intact, moving all extremities. Extremities: 2+DP b/l; no pitting edema. Skin: Warm, dry and intact. No rash.  Most Recent Laboratory Results:  CMP     Component  Value Date/Time   NA 134* 08/02/2012 1120   K 4.4 08/02/2012 1120   CL 99 08/02/2012 1120   CO2 28 08/02/2012 1120   GLUCOSE 129* 08/02/2012 1120   BUN 13 08/02/2012 1120   CREATININE 1.00 08/02/2012 1120   CREATININE 0.90 03/01/2011 0400   CALCIUM 11.0* 08/02/2012 1120   PROT 7.9 02/27/2011 2154   ALBUMIN 4.4 02/27/2011 2154   AST 26 02/27/2011 2154   ALT 42 02/27/2011 2154   ALKPHOS 68 02/27/2011 2154   BILITOT 0.4 02/27/2011 2154   GFRNONAA >90 03/01/2011 0400   GFRAA >90 03/01/2011 0400    CBC    Component Value Date/Time    WBC 9.8 03/01/2011 0400   RBC 4.55 03/01/2011 0400   HGB 14.0 03/01/2011 0400   HCT 41.4 03/01/2011 0400   PLT 295 03/01/2011 0400   MCV 91.0 03/01/2011 0400   MCH 30.8 03/01/2011 0400   MCHC 33.8 03/01/2011 0400   RDW 13.6 03/01/2011 0400   LYMPHSABS 1.4 02/27/2011 2154   MONOABS 0.3 02/27/2011 2154   EOSABS 0.0 02/27/2011 2154   BASOSABS 0.0 02/27/2011 2154    Lipid Panel Lab Results  Component Value Date   CHOL 223* 06/19/2012   HDL 44 06/19/2012   LDLCALC 155* 06/19/2012   TRIG 119 06/19/2012   CHOLHDL 5.1 06/19/2012    HA1C Lab Results  Component Value Date   HGBA1C >14.0 05/14/2013    Urinalysis No results found for this basename: colorurine,  appearanceur,  labspec,  phurine,  glucoseu,  hgbur,  bilirubinur,  ketonesur,  proteinur,  urobilinogen,  nitrite,  leukocytesur    Urine Microalbumin Lab Results  Component Value Date   MICROALBUR 0.50 08/02/2012    Imaging N/A   Assessment & Plan:   Assessment and plan was discussed and formulated with my attending.  [facility]  Topics to be addressed at next visit include: diabetes management.

## 2013-06-18 ENCOUNTER — Other Ambulatory Visit: Payer: Self-pay | Admitting: *Deleted

## 2013-06-18 MED ORDER — GLUCOSE BLOOD VI STRP: ORAL_STRIP | Status: DC

## 2013-06-18 NOTE — Assessment & Plan Note (Addendum)
Pt has an elevated PSA and was referred to urology however, he has not followed up.  He has a brother that is 53 yo who was diagnosed with prostate cancer and urged him to get the test.  I emphasized the importance of follow-up with urology but he states he has their number and will follow-up if he decides to do so.   -follow-up with urology

## 2013-06-18 NOTE — Telephone Encounter (Signed)
Pt has a monitor at home and wants to check his sugar once to twice a day.

## 2013-06-18 NOTE — Assessment & Plan Note (Signed)
BP Readings from Last 3 Encounters:  06/17/13 119/78  05/14/13 134/81  10/09/12 127/85    Lab Results  Component Value Date   NA 134* 08/02/2012   K 4.4 08/02/2012   CREATININE 1.00 08/02/2012    Assessment: Blood pressure control: controlled Progress toward BP goal:  at goal Comments: Pt is exercising and eating mainly vegetables.  Plan: Medications:  continue current medications   Current Outpatient Prescriptions on File Prior to Visit  Medication Sig Dispense Refill  . aspirin 81 MG tablet Take 81 mg by mouth daily.        . hydrochlorothiazide (HYDRODIURIL) 25 MG tablet Take 1 tablet (25 mg total) by mouth daily.  30 tablet  11  . pantoprazole (PROTONIX) 40 MG tablet Take 1 tablet (40 mg total) by mouth daily.  30 tablet  5   No current facility-administered medications on file prior to visit.

## 2013-06-18 NOTE — Assessment & Plan Note (Signed)
Pt quit smoking several months ago.  I congratulated him on this accomplishment.

## 2013-06-18 NOTE — Assessment & Plan Note (Signed)
Pt comes in for a follow-up of his diabetes.  His last HA1c was found to be greater than 14.0.  However, he reports dietary indiscretion over the holidays eating lots of cakes, sweets, etc.  He has been eating more vegetables lately.  His morning breakfast consists of oatmeal, lunch is salad, and dinner is mainly vegetables.  He reports running about a mile every other day.  His dose of metformin was increased during last visit to 1000mg  but he is unable to tolerate it as he states it makes him feel "tight."  He brought in his meter which was downloaded and did not reveal any blood sugars below 100.  However, highs were not above 300.    Lab Results  Component Value Date   HGBA1C >14.0 05/14/2013   -will add glipizide 5mg  to his regimen which will not be 500mg  metformin and 5mg  glipizide daily -he should follow up with HA1c repeat in 3 months

## 2013-06-18 NOTE — Telephone Encounter (Deleted)
Contour next [employer]

## 2013-06-18 NOTE — Assessment & Plan Note (Signed)
Pt is not interested in colonoscopy or vaccination.  I offered influenza and pneumococcal vaccine but he declined.  He is unaware of any FH of colon cancer.  -will continue to follow up with him regarding health maintenance

## 2013-06-22 NOTE — Progress Notes (Signed)
Case discussed with Dr. Gill at the time of the visit.  We reviewed the resident's history and exam and pertinent patient test results.  I agree with the assessment, diagnosis, and plan of care documented in the resident's note. 

## 2013-06-27 ENCOUNTER — Other Ambulatory Visit: Payer: Self-pay | Admitting: *Deleted

## 2013-06-27 MED ORDER — ONETOUCH ULTRA SYSTEM W/DEVICE KIT: PACK | Status: AC

## 2013-06-27 MED ORDER — GLUCOSE BLOOD VI STRP: ORAL_STRIP | Status: DC

## 2013-06-27 MED ORDER — ONETOUCH ULTRASOFT LANCETS MISC: Status: AC

## 2013-06-27 NOTE — Telephone Encounter (Signed)
[facility] [employer] insurance will only cover [employer] meter/supplies. Please check what I put in.  Thanks

## 2013-08-08 IMAGING — RF DG ESOPHAGUS
3 series · 15 of 24 positions shown · non-contrast
Comparison: None.

CLINICAL DATA: Rule out stricture

ESOPHOGRAM/BARIUM SWALLOW
TECHNIQUE: Combined double contrast and single contrast
examination performed using effervescent crystals, thick barium
liquid, and thin barium liquid.
Fluoroscopy time:  1.1 minutes.

[Series 1: run · 7 of 33 slices shown (1 of 3)]
[im 1/33]
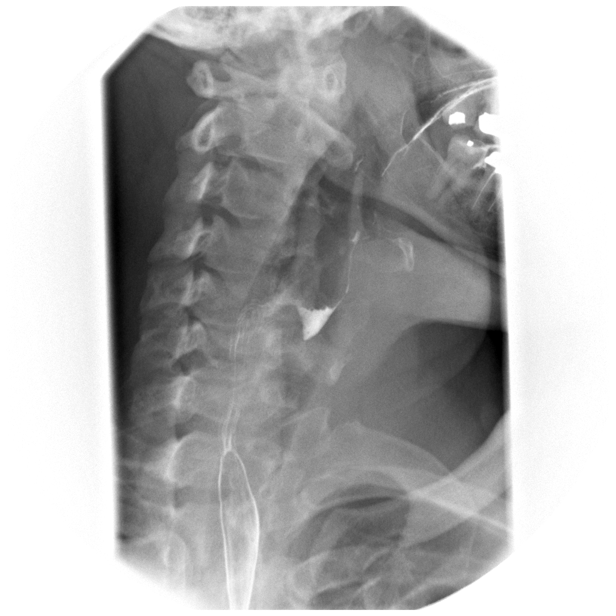
[im 6/33]
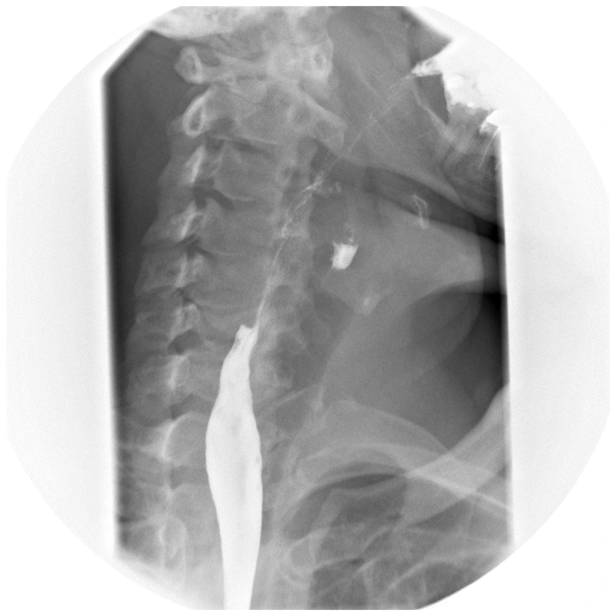
[im 12/33]
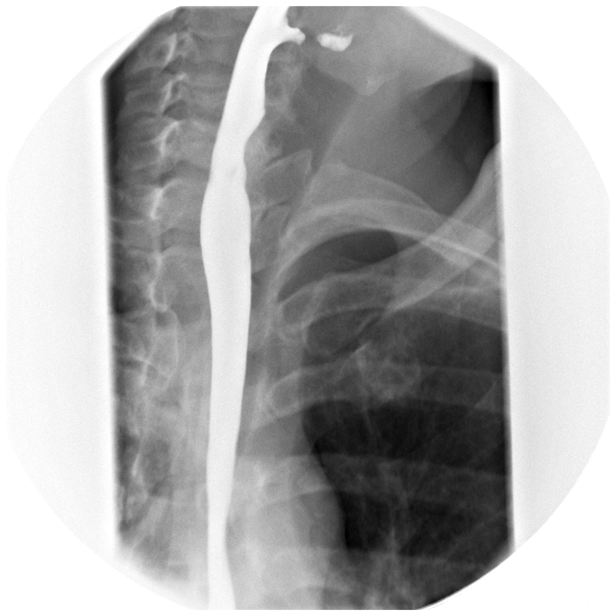
[im 15/33]
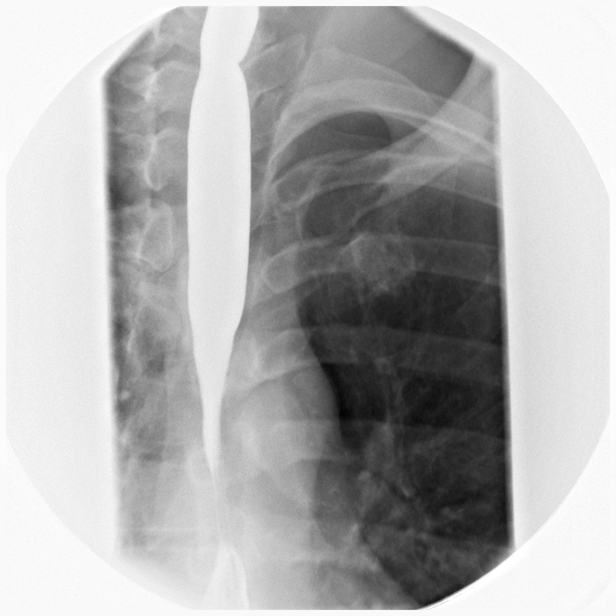
[im 21/33]
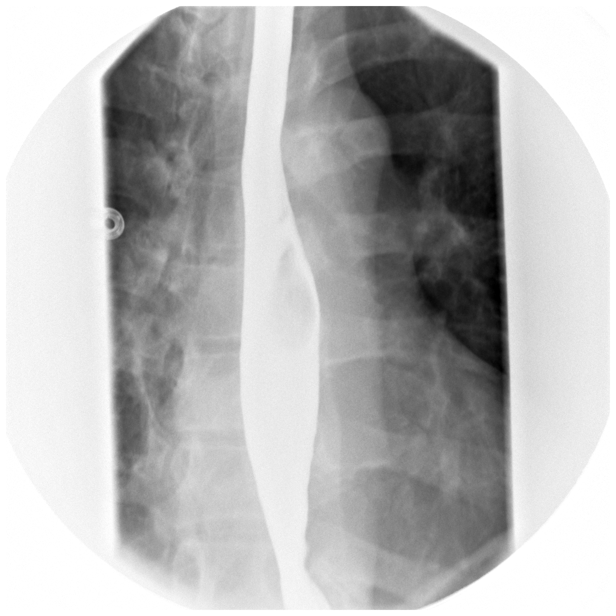
[im 24/33]
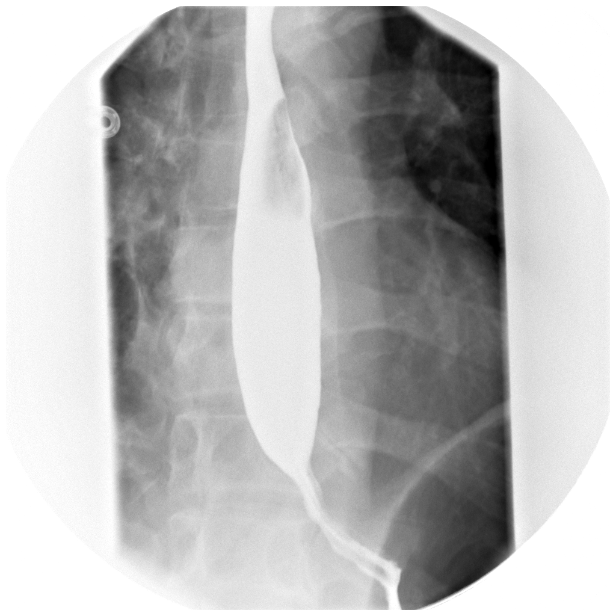
[im 30/33]
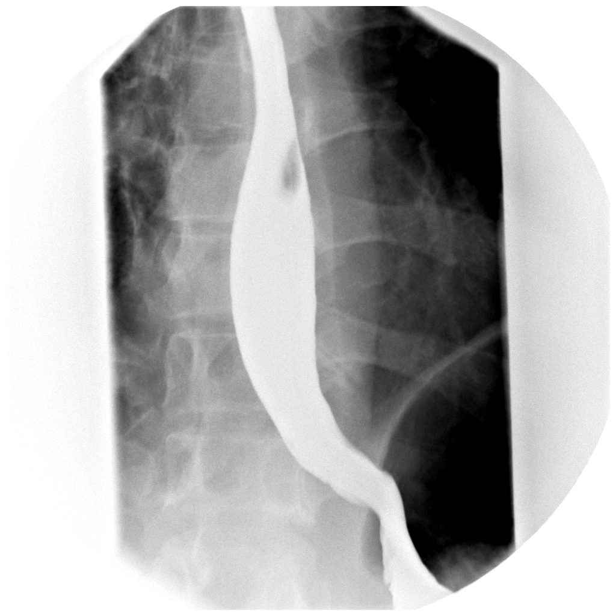

[Series 2: run · 1 of 1 slices shown (2 of 3)]
[im 1/1]
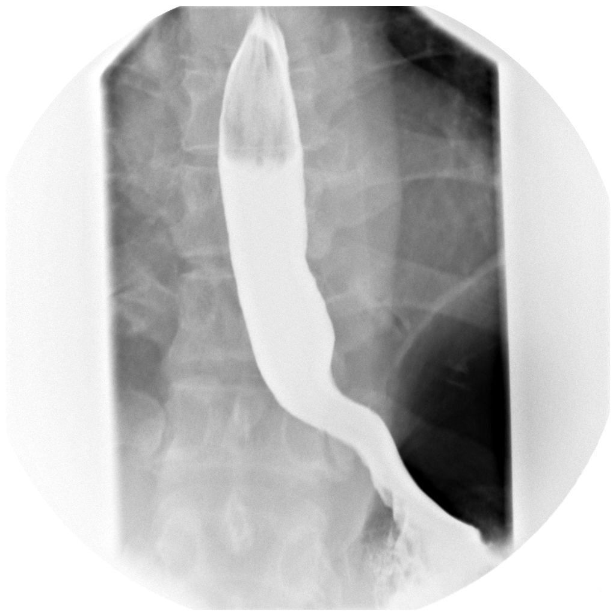

[Series 3: run · 7 of 30 slices shown (3 of 3)]
[im 1/30]
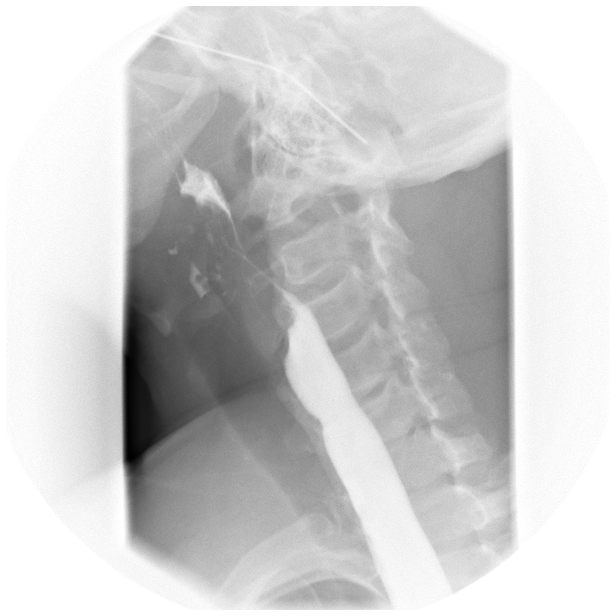
[im 6/30]
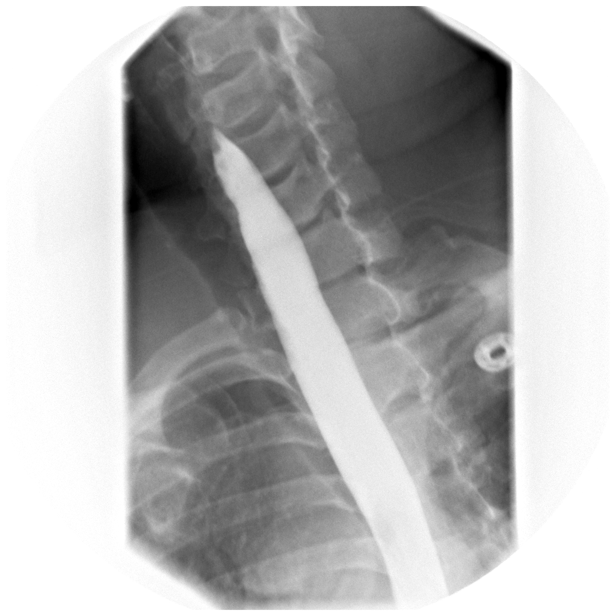
[im 9/30]
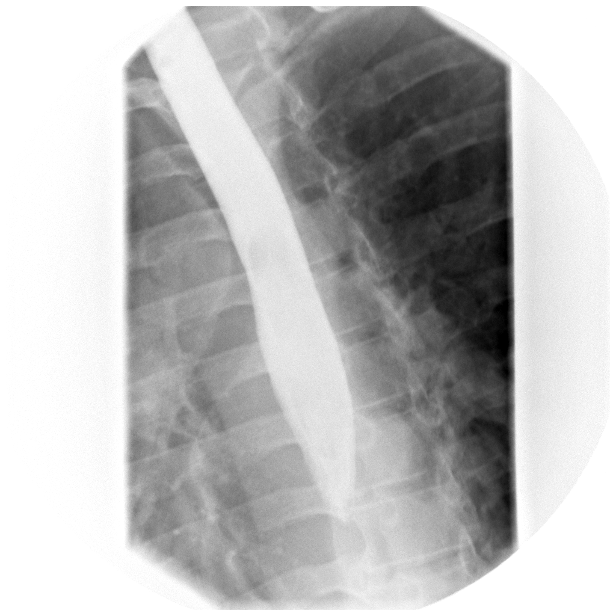
[im 15/30]
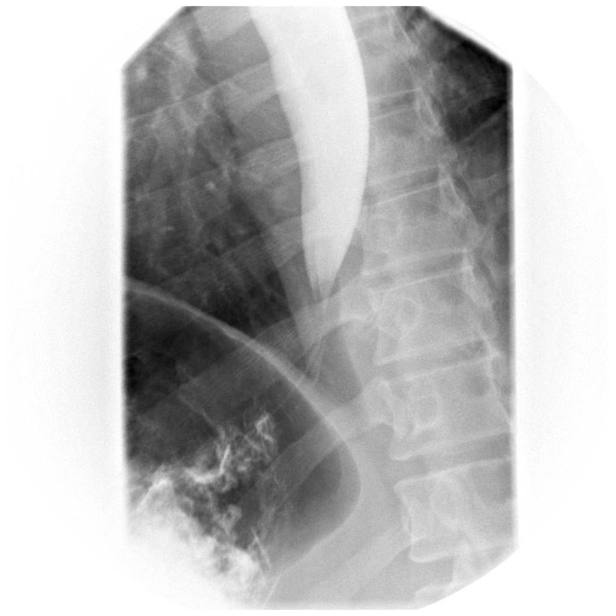
[im 21/30]
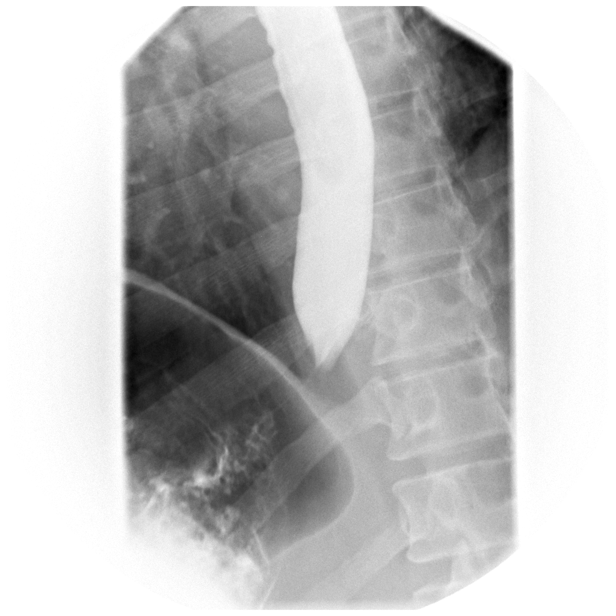
[im 24/30]
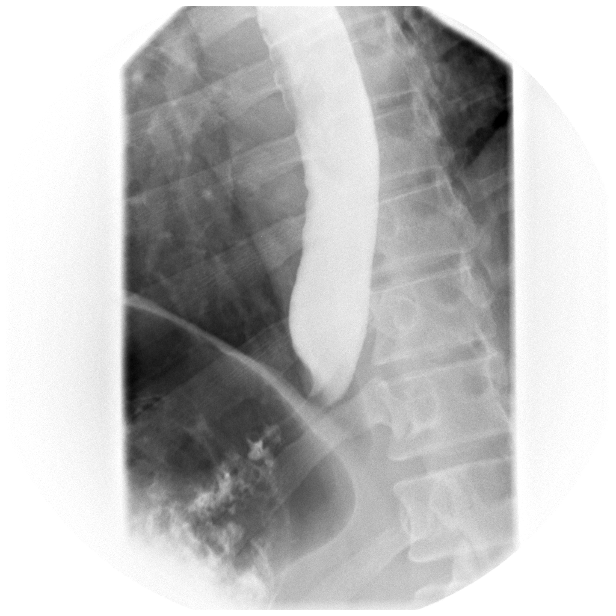
[im 30/30]
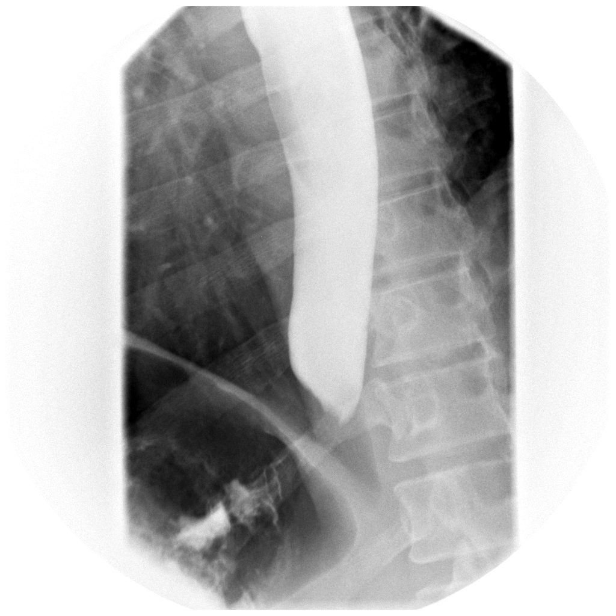

[15 of 24 positions shown; findings below may reference images not displayed]

FINDINGS: Esophageal mucosa and motility normal.  Negative for
stricture.  Negative for mass or diverticulum.

Negative for hiatal hernia or reflux.

Barium tablet passed into stomach without delay.
IMPRESSION: Normal

## 2013-08-15 ENCOUNTER — Other Ambulatory Visit: Payer: Self-pay

## 2013-09-09 ENCOUNTER — Encounter: Payer: Self-pay | Admitting: Internal Medicine

## 2013-12-02 ENCOUNTER — Encounter: Payer: Self-pay | Admitting: Internal Medicine

## 2013-12-17 ENCOUNTER — Encounter: Payer: Self-pay | Admitting: Internal Medicine

## 2013-12-26 ENCOUNTER — Telehealth: Payer: Self-pay | Admitting: Dietician

## 2013-12-26 NOTE — Telephone Encounter (Signed)
Thanks

## 2013-12-26 NOTE — Telephone Encounter (Signed)
Called patient and reminded him it is time for an appointment and that he has health maintenance items overdue. Pateint says he'll call to schedule an appointment with Dr. Delane GingerGill.

## 2014-02-17 ENCOUNTER — Encounter: Payer: Self-pay | Admitting: Internal Medicine

## 2014-04-07 ENCOUNTER — Other Ambulatory Visit: Payer: Self-pay | Admitting: *Deleted

## 2014-04-08 MED ORDER — GLIPIZIDE 5 MG PO TABS: 5.0000 mg | ORAL_TABLET | Freq: Every day | ORAL | Status: DC

## 2014-04-08 MED ORDER — HYDROCHLOROTHIAZIDE 25 MG PO TABS: 25.0000 mg | ORAL_TABLET | Freq: Every day | ORAL | Status: DC

## 2014-04-12 ENCOUNTER — Encounter: Payer: Self-pay | Admitting: Internal Medicine

## 2014-04-14 ENCOUNTER — Ambulatory Visit (INDEPENDENT_AMBULATORY_CARE_PROVIDER_SITE_OTHER): Payer: No Typology Code available for payment source | Admitting: Internal Medicine

## 2014-04-14 ENCOUNTER — Encounter: Payer: Self-pay | Admitting: Internal Medicine

## 2014-04-14 VITALS — BP 131/82 | HR 70 | Temp 97.7°F | Ht 66.0 in | Wt 177.5 lb

## 2014-04-14 DIAGNOSIS — Z0000 Encounter for general adult medical examination without abnormal findings: Secondary | ICD-10-CM

## 2014-04-14 DIAGNOSIS — I10 Essential (primary) hypertension: Secondary | ICD-10-CM

## 2014-04-14 DIAGNOSIS — E118 Type 2 diabetes mellitus with unspecified complications: Secondary | ICD-10-CM

## 2014-04-14 DIAGNOSIS — R972 Elevated prostate specific antigen [PSA]: Secondary | ICD-10-CM

## 2014-04-14 DIAGNOSIS — E1165 Type 2 diabetes mellitus with hyperglycemia: Secondary | ICD-10-CM

## 2014-04-14 DIAGNOSIS — L84 Corns and callosities: Secondary | ICD-10-CM | POA: Insufficient documentation

## 2014-04-14 LAB — COMPLETE METABOLIC PANEL WITHOUT GFR
ALT: 41 U/L (ref 0–53)
AST: 25 U/L (ref 0–37)
Albumin: 4.4 g/dL (ref 3.5–5.2)
Alkaline Phosphatase: 65 U/L (ref 39–117)
BUN: 16 mg/dL (ref 6–23)
CO2: 27 meq/L (ref 19–32)
Calcium: 10.8 mg/dL — ABNORMAL HIGH (ref 8.4–10.5)
Chloride: 98 meq/L (ref 96–112)
Creat: 1.09 mg/dL (ref 0.50–1.35)
GFR, Est African American: 89 mL/min
GFR, Est Non African American: 77 mL/min
Glucose, Bld: 128 mg/dL — ABNORMAL HIGH (ref 70–99)
Potassium: 3.8 meq/L (ref 3.5–5.3)
Sodium: 136 meq/L (ref 135–145)
Total Bilirubin: 0.4 mg/dL (ref 0.2–1.2)
Total Protein: 7.3 g/dL (ref 6.0–8.3)

## 2014-04-14 LAB — LIPID PANEL
Cholesterol: 218 mg/dL — ABNORMAL HIGH (ref 0–200)
HDL: 48 mg/dL (ref >39)
LDL Cholesterol: 126 mg/dL — ABNORMAL HIGH (ref 0–99)
Total CHOL/HDL Ratio: 4.5 Ratio
Triglycerides: 220 mg/dL — ABNORMAL HIGH (ref <150)
VLDL: 44 mg/dL — ABNORMAL HIGH (ref 0–40)

## 2014-04-14 LAB — GLUCOSE, CAPILLARY: Glucose-Capillary: 126 mg/dL — ABNORMAL HIGH (ref 70–99)

## 2014-04-14 LAB — POCT GLYCOSYLATED HEMOGLOBIN (HGB A1C): Hemoglobin A1C: 6.8

## 2014-04-14 MED ORDER — TADALAFIL 10 MG PO TABS: 10.0000 mg | ORAL_TABLET | ORAL | Status: DC | PRN

## 2014-04-14 MED ORDER — CANAGLIFLOZIN 100 MG PO TABS: 100.0000 mg | ORAL_TABLET | Freq: Every day | ORAL | Status: DC

## 2014-04-14 MED ORDER — GLIPIZIDE 5 MG PO TABS: 5.0000 mg | ORAL_TABLET | ORAL | Status: DC

## 2014-04-14 NOTE — Progress Notes (Signed)
Patient ID: Gregory Sosa, male   DOB: 09-09-1960, 53 y.o.   MRN: [id number]    Subjective:   Patient ID: Gregory Sosa male    DOB: [birth year] 53 y.o.    MRN: [id number] Health Maintenance Due: Health Maintenance Due  Topic Date Due  . PNEUMOCOCCAL POLYSACCHARIDE VACCINE (1) 01/02/1963  . TETANUS/TDAP  01/02/1980  . FOOT EXAM  10/09/2013    _________________________________________________  HPI: [name] is a 53 y.o. male here for a routine visit.  Pt has a PMH outlined below.  Please see problem-based charting assessment and plan note for further details of medical issues addressed at today's visit.  PMH: Past Medical History  Diagnosis Date  . Hypertension     Medications: Current Outpatient Prescriptions on File Prior to Visit  Medication Sig Dispense Refill  . aspirin 81 MG tablet Take 81 mg by mouth daily.      . Blood Glucose Monitoring Suppl (ONE TOUCH ULTRA SYSTEM KIT) W/DEVICE KIT Use to check sugar once to twice a day. Dx code: 250.00. 1 each 0  . glucose blood (BAYER CONTOUR NEXT TEST) test strip Use as instructed 100 each 12  . glucose blood (ONE TOUCH TEST STRIPS) test strip Use to check sugar once to twice a day. Dx code: 250.00. 100 each 12  . hydrochlorothiazide (HYDRODIURIL) 25 MG tablet Take 1 tablet (25 mg total) by mouth daily. 30 tablet 3  . Lancets (ONETOUCH ULTRASOFT) lancets Use to check sugar once to twice a day. Dx code: 250.00. 100 each 12   No current facility-administered medications on file prior to visit.    Allergies: No Known Allergies  FH: No family history on file.  SH: History   Social History  . Marital Status: Married    Spouse Name: N/A    Number of Children: N/A  . Years of Education: N/A   Social History Main Topics  . Smoking status: Former Smoker -- 0.50 packs/day    Types: Cigarettes    Quit date: 02/06/2013  . Smokeless tobacco: None     Comment: stopped 3 months ago  . Alcohol Use: Yes     Comment: Wine  .  Drug Use: None  . Sexual Activity: None   Other Topics Concern  . None   Social History Narrative    Review of Systems: Constitutional: Negative for fever, chills and weight loss.  Eyes: Negative for blurred vision.  Respiratory: Negative for cough and shortness of breath.  Cardiovascular: Negative for chest pain, palpitations and leg swelling.  Gastrointestinal: Negative for nausea, vomiting, abdominal pain, diarrhea, constipation and blood in stool.  Genitourinary: Negative for dysuria, urgency and frequency.  Musculoskeletal: Negative for myalgias and back pain.  Neurological: Negative for dizziness, weakness and headaches.     Objective:   Vital Signs: Filed Vitals:   04/14/14 1549  BP: 131/82  Pulse: 70  Temp: 97.7 F (36.5 C)  TempSrc: Oral  Height: _0  (1.676 m)  Weight: 177 lb 8 oz (80.513 kg)  SpO2: 100%     BP Readings from Last 3 Encounters:  04/14/14 131/82  06/17/13 119/78  05/14/13 134/81    Physical Exam: Constitutional: Vital signs reviewed.  Patient is well-developed and well-nourished in NAD and cooperative with exam.  Head: Normocephalic and atraumatic. Eyes: PERRL, EOMI, conjunctivae nl, no scleral icterus.  Neck: Supple. Cardiovascular: RRR, no MRG. Pulmonary/Chest: normal effort, non-tender to palpation, CTAB, no wheezes, rales, or rhonchi. Abdominal: Soft. NT/ND +BS. Neurological: A&O x3, cranial nerves  II-XII are grossly intact, moving all extremities. Extremities: 2+DP b/l; no pitting edema. Skin: Warm, dry and intact. No rash.   Assessment & Plan:   Assessment and plan was discussed and formulated with my attending.

## 2014-04-14 NOTE — Assessment & Plan Note (Addendum)
-  referral to podiatry, but pt declines at this time

## 2014-04-14 NOTE — Patient Instructions (Addendum)
Thank you for your visit today.   Please return to the internal medicine clinic in 6 month(s) or sooner if needed.     Your current medical regimen is effective;  continue present plan and take all medications as prescribed.  You may take glipizide every other day.  Also, please take a baby aspirin daily.    Please limit your alcohol to 2 drinks per day.  Please complete the stool cards.  Please be sure to bring all of your medications with you to every visit; this includes herbal supplements, vitamins, eye drops, and any over-the-counter medications.   Should you have any questions regarding your medications and/or any new or worsening symptoms, please be sure to call the clinic at [phone].   If you believe that you are suffering from a life threatening condition or one that may result in the loss of limb or function, then you should call 911 or proceed to the nearest Emergency Department.     A healthy lifestyle and preventative care can promote health and wellness.   Maintain regular health, dental, and eye exams.  Eat a healthy diet. Foods like vegetables, fruits, whole grains, low-fat dairy products, and lean protein foods contain the nutrients you need without too many calories. Decrease your intake of foods high in solid fats, added sugars, and salt. Get information about a proper diet from your caregiver, if necessary.  Regular physical exercise is one of the most important things you can do for your health. Most adults should get at least 150 minutes of moderate-intensity exercise (any activity that increases your heart rate and causes you to sweat) each week. In addition, most adults need muscle-strengthening exercises on 2 or more days a week.   Maintain a healthy weight. The body mass index (BMI) is a screening tool to identify possible weight problems. It provides an estimate of body fat based on height and weight. Your caregiver can help determine your BMI, and can  help you achieve or maintain a healthy weight. For adults 20 years and older:  A BMI below 18.5 is considered underweight.  A BMI of 18.5 to 24.9 is normal.  A BMI of 25 to 29.9 is considered overweight.  A BMI of 30 and above is considered obese.

## 2014-04-15 LAB — MICROALBUMIN / CREATININE URINE RATIO
Creatinine, Urine: 179.9 mg/dL
Microalb Creat Ratio: 2.8 mg/g (ref 0.0–30.0)
Microalb, Ur: 0.5 mg/dL (ref <2.0)

## 2014-04-19 NOTE — Assessment & Plan Note (Addendum)
-  declined influenza and pneumococcal vaccines -gave hemoccult cards -check lipid panel, urine ACR, CMP

## 2014-04-19 NOTE — Assessment & Plan Note (Addendum)
BP Readings from Last 3 Encounters:  04/14/14 131/82  06/17/13 119/78  05/14/13 134/81    Lab Results  Component Value Date   NA 136 04/14/2014   K 3.8 04/14/2014   CREATININE 1.09 04/14/2014    Assessment: Blood pressure control: controlled Progress toward BP goal:  at goal Comments: pt exercises several times per/wk  Plan: Medications:  continue current medications of HCTZ 25mg  daily and ASA 81mg  (pt is reluctant to take but I expressed the importance)  Other plans: continue to exercise and watch sodium intake and diet  Refilled cialis

## 2014-04-19 NOTE — Assessment & Plan Note (Addendum)
Lab Results  Component Value Date   HGBA1C 6.8 04/14/2014   HGBA1C >14.0 05/14/2013   HGBA1C 6.7 10/09/2012     Assessment: Diabetes control: good control (HgbA1C at goal) Progress toward A1C goal:  improved Comments: pt reports taking glucotrol 5mg  every other day; exercises several times/wk; denies any low bs  Plan: Medications:  continue current medications of glipizide 5mg  every other day Home glucose monitoring: not indicated-->only on orals  Instruction/counseling given: reminded to get eye exam, discussed foot care and discussed diet Other plans: pt reports having an eye exam earlier this year-->will ask Mamie to obtain records Check lipid panel, urine ACR, declines vaccinations  f/u in 6 months

## 2014-04-19 NOTE — Assessment & Plan Note (Signed)
-  pt does not wish to follow-up on PSA

## 2014-04-22 NOTE — Progress Notes (Signed)
Case discussed with Dr. Gill soon after the resident saw the patient.  We reviewed the resident's history and exam and pertinent patient test results.  I agree with the assessment, diagnosis, and plan of care documented in the resident's note. 

## 2014-04-29 ENCOUNTER — Other Ambulatory Visit: Payer: Self-pay | Admitting: Internal Medicine

## 2014-04-29 MED ORDER — ATORVASTATIN CALCIUM 80 MG PO TABS: 80.0000 mg | ORAL_TABLET | Freq: Every day | ORAL | Status: DC

## 2014-09-08 ENCOUNTER — Other Ambulatory Visit: Payer: Self-pay | Admitting: *Deleted

## 2014-09-08 DIAGNOSIS — E118 Type 2 diabetes mellitus with unspecified complications: Secondary | ICD-10-CM

## 2014-09-08 MED ORDER — GLIPIZIDE 5 MG PO TABS: 5.0000 mg | ORAL_TABLET | ORAL | Status: DC

## 2014-09-09 ENCOUNTER — Telehealth: Payer: Self-pay | Admitting: *Deleted

## 2014-09-09 ENCOUNTER — Other Ambulatory Visit: Payer: Self-pay | Admitting: Internal Medicine

## 2014-09-09 DIAGNOSIS — E118 Type 2 diabetes mellitus with unspecified complications: Secondary | ICD-10-CM

## 2014-09-09 MED ORDER — GLIPIZIDE 5 MG PO TABS: 5.0000 mg | ORAL_TABLET | Freq: Every day | ORAL | Status: DC

## 2014-09-09 NOTE — Telephone Encounter (Signed)
Pharm called and states script for glipizide reads 1 every other day #30, he usually takes 1 daily, could you please send a correct script to [facility]? Thanks!

## 2014-09-09 NOTE — Telephone Encounter (Signed)
Please call the pharmacy and have them change the prescription to glipizide 5mg  once daily. Thanks!

## 2014-10-13 ENCOUNTER — Ambulatory Visit (INDEPENDENT_AMBULATORY_CARE_PROVIDER_SITE_OTHER): Payer: No Typology Code available for payment source | Admitting: Internal Medicine

## 2014-10-13 VITALS — BP 149/83 | HR 65 | Temp 97.8°F | Ht 66.0 in | Wt 181.6 lb

## 2014-10-13 DIAGNOSIS — I10 Essential (primary) hypertension: Secondary | ICD-10-CM

## 2014-10-13 DIAGNOSIS — Z87891 Personal history of nicotine dependence: Secondary | ICD-10-CM

## 2014-10-13 DIAGNOSIS — R972 Elevated prostate specific antigen [PSA]: Secondary | ICD-10-CM

## 2014-10-13 DIAGNOSIS — E118 Type 2 diabetes mellitus with unspecified complications: Secondary | ICD-10-CM

## 2014-10-13 DIAGNOSIS — Z0000 Encounter for general adult medical examination without abnormal findings: Secondary | ICD-10-CM

## 2014-10-13 DIAGNOSIS — E785 Hyperlipidemia, unspecified: Secondary | ICD-10-CM

## 2014-10-13 DIAGNOSIS — N521 Erectile dysfunction due to diseases classified elsewhere: Secondary | ICD-10-CM | POA: Diagnosis not present

## 2014-10-13 DIAGNOSIS — E1169 Type 2 diabetes mellitus with other specified complication: Secondary | ICD-10-CM | POA: Diagnosis not present

## 2014-10-13 DIAGNOSIS — Z87898 Personal history of other specified conditions: Secondary | ICD-10-CM

## 2014-10-13 DIAGNOSIS — E210 Primary hyperparathyroidism: Secondary | ICD-10-CM | POA: Insufficient documentation

## 2014-10-13 LAB — GLUCOSE, CAPILLARY: Glucose-Capillary: 110 mg/dL — ABNORMAL HIGH (ref 65–99)

## 2014-10-13 LAB — POCT GLYCOSYLATED HEMOGLOBIN (HGB A1C): Hemoglobin A1C: 6.8

## 2014-10-13 MED ORDER — GLIPIZIDE 5 MG PO TABS: 5.0000 mg | ORAL_TABLET | Freq: Every day | ORAL | Status: DC

## 2014-10-13 NOTE — Assessment & Plan Note (Addendum)
Pt with mildly elevated calcium on last CMP.  Asymptomatic.  Possible etiologies include primary hyperparathyroidism, malignancy (elevated PSA), and possibly HCTZ (although he takes this sporadically) or other supplements.   -will recheck CMP and PTH at next OV

## 2014-10-13 NOTE — Patient Instructions (Signed)
Thank you for your visit today.   Please return to the internal medicine clinic in 3-4 month(s) or sooner if needed.     Your current medical regimen is effective;  continue present plan and take all medications as prescribed.    I have sent your medication refills to your pharmacy.   I have made the following referrals for you: cardiology.   You need the following test(s) for regular health maintenance: colonoscopy.  Please take a baby aspirin daily and atorvastatin.  Also if you continue to have continued chest pain please call 911 and go to the nearest emergency room.    Please be sure to bring all of your medications with you to every visit; this includes herbal supplements, vitamins, eye drops, and any over-the-counter medications.   Should you have any questions regarding your medications and/or any new or worsening symptoms, please be sure to call the clinic at [phone].   If you believe that you are suffering from a life threatening condition or one that may result in the loss of limb or function, then you should call 911 or proceed to the nearest Emergency Department.     A healthy lifestyle and preventative care can promote health and wellness.   Maintain regular health, dental, and eye exams.  Eat a healthy diet. Foods like vegetables, fruits, whole grains, low-fat dairy products, and lean protein foods contain the nutrients you need without too many calories. Decrease your intake of foods high in solid fats, added sugars, and salt. Get information about a proper diet from your caregiver, if necessary.  Regular physical exercise is one of the most important things you can do for your health. Most adults should get at least 150 minutes of moderate-intensity exercise (any activity that increases your heart rate and causes you to sweat) each week. In addition, most adults need muscle-strengthening exercises on 2 or more days a week.   Maintain a healthy weight. The body  mass index (BMI) is a screening tool to identify possible weight problems. It provides an estimate of body fat based on height and weight. Your caregiver can help determine your BMI, and can help you achieve or maintain a healthy weight. For adults 20 years and older:  A BMI below 18.5 is considered underweight.  A BMI of 18.5 to 24.9 is normal.  A BMI of 25 to 29.9 is considered overweight.  A BMI of 30 and above is considered obese.

## 2014-10-13 NOTE — Progress Notes (Signed)
Patient ID: Gregory Sosa, male   DOB: March 03, 1961, 54 y.o.   MRN: [id number]     Subjective:   Patient ID: Gregory Sosa male    DOB: 1961/03/21 54 y.o.    MRN: [id number] Health Maintenance Due: Health Maintenance Due  Topic Date Due  . HIV Screening  01/02/1976  . TETANUS/TDAP  01/02/1980    _________________________________________________  HPI: [name] is a 54 y.o. male here for a routine visit.  Pt has a PMH outlined below.  Please see problem-based charting assessment and plan note for further details of medical issues addressed at today's visit.  PMH: Past Medical History  Diagnosis Date  . Hypertension     Medications: Current Outpatient Prescriptions on File Prior to Visit  Medication Sig Dispense Refill  . aspirin 81 MG tablet Take 81 mg by mouth daily.      [name] atorvastatin (LIPITOR) 80 MG tablet Take 1 tablet (80 mg total) by mouth daily at 6 PM. 30 tablet 3  . Blood Glucose Monitoring Suppl (ONE TOUCH ULTRA SYSTEM KIT) W/DEVICE KIT Use to check sugar once to twice a day. Dx code: 250.00. 1 each 0  . glucose blood (BAYER CONTOUR NEXT TEST) test strip Use as instructed 100 each 12  . glucose blood (ONE TOUCH TEST STRIPS) test strip Use to check sugar once to twice a day. Dx code: 250.00. 100 each 12  . hydrochlorothiazide (HYDRODIURIL) 25 MG tablet Take 1 tablet (25 mg total) by mouth daily. 30 tablet 3  . Lancets (ONETOUCH ULTRASOFT) lancets Use to check sugar once to twice a day. Dx code: 250.00. 100 each 12  . tadalafil (CIALIS) 10 MG tablet Take 1 tablet (10 mg total) by mouth as needed for erectile dysfunction. 20 tablet 1   No current facility-administered medications on file prior to visit.    Allergies: No Known Allergies  FH: No family history on file.  SH: History   Social History  . Marital Status: Married    Spouse Name: N/A  . Number of Children: N/A  . Years of Education: N/A   Social History Main Topics  . Smoking status: Former Smoker  -- 0.50 packs/day    Types: Cigarettes    Quit date: 02/06/2013  . Smokeless tobacco: Not on file     Comment: stopped 3 months ago  . Alcohol Use: Yes     Comment: Wine  . Drug Use: Not on file  . Sexual Activity: Not on file   Other Topics Concern  . Not on file   Social History Narrative    Review of Systems: Constitutional: Negative for fever, chills and weight loss.  Eyes: Negative for blurred vision.  Respiratory: Negative for cough and shortness of breath.  Cardiovascular: + chest pain, no palpitations and no leg swelling.  Gastrointestinal: Negative for nausea, vomiting, abdominal pain, diarrhea, constipation and blood in stool.  Genitourinary: Negative for dysuria, urgency and frequency.  Musculoskeletal: Negative for myalgias and back pain.  Neurological: Negative for dizziness, weakness and headaches.     Objective:   Vital Signs: Filed Vitals:   10/13/14 1554  BP: 149/83  Pulse: 65  Temp: 97.8 F (36.6 C)  TempSrc: Oral  Height: _0  (1.676 m)  Weight: 82.373 kg (181 lb 9.6 oz)  SpO2: 100%     BP Readings from Last 3 Encounters:  10/13/14 149/83  04/14/14 131/82  06/17/13 119/78    Physical Exam: Constitutional: Vital signs reviewed.  Patient is in NAD and  cooperative with exam.  Head: Normocephalic and atraumatic. Eyes: EOMI, conjunctivae nl, no scleral icterus.  Neck: Supple. Cardiovascular: RRR, no MRG. Pulmonary/Chest: normal effort, CTAB, no wheezes, rales, or rhonchi. Abdominal: Soft. NT/ND +BS. Musculoskeletal: Full ROM, without pain,edema,or deformity.  Neurological: A&O x3, cranial nerves II-XII are grossly intact, moving all extremities. Extremities: 2+DP b/l; no pitting edema. Skin: Warm, dry and intact. No rash.   Assessment & Plan:   Assessment and plan was discussed and formulated with my attending.

## 2014-10-19 ENCOUNTER — Encounter: Payer: Self-pay | Admitting: Internal Medicine

## 2014-10-19 NOTE — Assessment & Plan Note (Signed)
Pt not taking statin. -encouraged him to do so

## 2014-10-19 NOTE — Assessment & Plan Note (Addendum)
BP: (!) 149/83 mmHg  BP today above goal at 149/83.  He is supposed to be taking HCTA 25mg  daily but is not because he doesn't think he needs it.  Has also not been taking his baby ASA or atorvastatin.   -encouraged him to take his BP med and discussed the possibility of kidney disease, heart disease/stroke.   -encouraged him to take baby ASA and atorvastatin

## 2014-10-19 NOTE — Assessment & Plan Note (Signed)
-  check HA1c  -refilled glipizide

## 2014-10-19 NOTE — Assessment & Plan Note (Signed)
Discussed PSA level again but he is not interested in going to urology.   -encouraged him to follow up

## 2014-10-19 NOTE — Assessment & Plan Note (Signed)
Pt declines all vaccines and states "I'm good" -cont to encourage

## 2014-10-19 NOTE — Assessment & Plan Note (Signed)
Pt reports having chest pain while sitting in a chair at work.  States this happens a couple times in the past week.  Describes the pain as a pressure that lasts for a few minutes and then goes away.  Relieved by or exacerbated by nothing.  States he has been under a lot of stress at work recently.  Denies any diaphoresis, N/V, lightheadedness, palpitations, SOB, abdominal pain.  Does not have a h/o acid reflex.  Had a cath in 2012 that showed no evidence of significant CAD.  However, he has risk factors including diabetes, dyslipidemia, HTN.  There is also a strong FH as his father died from a MI in his 58s.  He is not taking a baby ASA, atorvastatin, or BP meds as he is supposed to. He does take glipizide daily.  [place] CHD risk score is 10.1% risk of heart attack or death In next 10 years.  EKG today is unchanged from prior.   -advised he call 911 and go to the nearest ED if he has worrisome chest pain again -advised him to cont ASA and all other meds  -will refer to cardiology for possible stress test and further w/u given risk factors

## 2014-10-20 NOTE — Progress Notes (Signed)
Internal Medicine Clinic Attending  Case discussed with Dr. Gill soon after the resident saw the patient.  We reviewed the resident's history and exam and pertinent patient test results.  I agree with the assessment, diagnosis, and plan of care documented in the resident's note.  

## 2014-11-17 NOTE — Addendum Note (Signed)
Addended by: Neomia DearPOWERS, Tonny Isensee E on: 11/17/2014 08:15 PM   Modules accepted: Orders

## 2015-05-18 ENCOUNTER — Other Ambulatory Visit: Payer: Self-pay | Admitting: Internal Medicine

## 2015-05-19 MED FILL — glipiZIDE 5 MG TABS: 5 | 30 days supply | Qty: 30 | Fill #6

## 2015-06-18 MED FILL — glipiZIDE 5 MG TABS: 5 | 30 days supply | Qty: 30 | Fill #1

## 2015-06-22 ENCOUNTER — Encounter: Payer: Self-pay | Admitting: Internal Medicine

## 2015-06-22 ENCOUNTER — Ambulatory Visit (INDEPENDENT_AMBULATORY_CARE_PROVIDER_SITE_OTHER): Payer: BLUE CROSS/BLUE SHIELD | Admitting: Internal Medicine

## 2015-06-22 VITALS — BP 143/81 | HR 58 | Temp 97.6°F | Wt 177.7 lb

## 2015-06-22 DIAGNOSIS — I10 Essential (primary) hypertension: Secondary | ICD-10-CM | POA: Diagnosis not present

## 2015-06-22 DIAGNOSIS — Z7984 Long term (current) use of oral hypoglycemic drugs: Secondary | ICD-10-CM

## 2015-06-22 DIAGNOSIS — E118 Type 2 diabetes mellitus with unspecified complications: Secondary | ICD-10-CM

## 2015-06-22 DIAGNOSIS — L84 Corns and callosities: Secondary | ICD-10-CM

## 2015-06-22 DIAGNOSIS — E119 Type 2 diabetes mellitus without complications: Secondary | ICD-10-CM | POA: Diagnosis not present

## 2015-06-22 DIAGNOSIS — E1169 Type 2 diabetes mellitus with other specified complication: Secondary | ICD-10-CM

## 2015-06-22 DIAGNOSIS — N521 Erectile dysfunction due to diseases classified elsewhere: Secondary | ICD-10-CM

## 2015-06-22 DIAGNOSIS — Z0000 Encounter for general adult medical examination without abnormal findings: Secondary | ICD-10-CM

## 2015-06-22 DIAGNOSIS — Z8679 Personal history of other diseases of the circulatory system: Secondary | ICD-10-CM

## 2015-06-22 DIAGNOSIS — E785 Hyperlipidemia, unspecified: Secondary | ICD-10-CM

## 2015-06-22 LAB — POCT GLYCOSYLATED HEMOGLOBIN (HGB A1C): Hemoglobin A1C: 7

## 2015-06-22 LAB — GLUCOSE, CAPILLARY: Glucose-Capillary: 110 mg/dL — ABNORMAL HIGH (ref 65–99)

## 2015-06-22 LAB — HM DIABETES EYE EXAM

## 2015-06-22 MED ORDER — GLIPIZIDE 5 MG PO TABS: 5.0000 mg | ORAL_TABLET | Freq: Two times a day (BID) | ORAL | Status: DC

## 2015-06-22 NOTE — Patient Instructions (Addendum)
Thank you for your visit today.   Please return to the internal medicine clinic in 3-4 month(s) or sooner if needed.     I have made the following additions/changes to your medications: Take  glipizide two times daily before meals.    I have made the following referrals for you: We will refer you to podiatry for your nails.    Please be sure to bring all of your medications with you to every visit; this includes herbal supplements, vitamins, eye drops, and any over-the-counter medications.   Should you have any questions regarding your medications and/or any new or worsening symptoms, please be sure to call the clinic at [phone].   If you believe that you are suffering from a life threatening condition or one that may result in the loss of limb or function, then you should call 911 and proceed to the nearest Emergency Department.   A healthy lifestyle and preventative care can promote health and wellness.   Maintain regular health, dental, and eye exams.  Eat a healthy diet. Foods like vegetables, fruits, whole grains, low-fat dairy products, and lean protein foods contain the nutrients you need without too many calories. Decrease your intake of foods high in solid fats, added sugars, and salt. Get information about a proper diet from your caregiver, if necessary.  Regular physical exercise is one of the most important things you can do for your health. Most adults should get at least 150 minutes of moderate-intensity exercise (any activity that increases your heart rate and causes you to sweat) each week. In addition, most adults need muscle-strengthening exercises on 2 or more days a week.   Maintain a healthy weight. The body mass index (BMI) is a screening tool to identify possible weight problems. It provides an estimate of body fat based on height and weight. Your caregiver can help determine your BMI, and can help you achieve or maintain a healthy weight. For adults 20 years  and older:  A BMI below 18.5 is considered underweight.  A BMI of 18.5 to 24.9 is normal.  A BMI of 25 to 29.9 is considered overweight.  A BMI of 30 and above is considered obese.

## 2015-06-22 NOTE — Assessment & Plan Note (Signed)
A: Calluses also with ingrown toenail R great toe.  P: Referral to podiatry

## 2015-06-22 NOTE — Assessment & Plan Note (Signed)
A: Dyslipidemia: Has not been taking atorvastatin  P: Encouraged atorvastatin use but he states "I'm good."

## 2015-06-22 NOTE — Assessment & Plan Note (Signed)
A: Pt does not want to take HCTZ  even though prescribed.  He states his SBP is about 130s when he checks it at home. P: I encouraged him to continue HCTZ  but he states "I'm good." Check CMP

## 2015-06-22 NOTE — Progress Notes (Addendum)
Patient ID: Gregory Sosa, male   DOB: 1960/12/12, 55 y.o.   MRN: [id number]     Subjective:   Patient ID: Gregory Sosa male    DOB: 1960/09/14 55 y.o.    MRN: [id number] Health Maintenance Due: Health Maintenance Due  Topic Date Due  . OPHTHALMOLOGY EXAM  03/12/2014  . HEMOGLOBIN A1C  01/13/2015  . LIPID PANEL  04/15/2015  . URINE MICROALBUMIN  04/15/2015    _________________________________________________  HPI: Gregory Sosa is a 55 y.o. male here for a routine visit.  Pt has a PMH outlined below.  Please see problem-based charting assessment and plan for further status of patient's chronic medical problems addressed at today's visit.  PMH: Past Medical History  Diagnosis Date  . Hypertension     Medications: Current Outpatient Prescriptions on File Prior to Visit  Medication Sig Dispense Refill  . aspirin 81 MG tablet Take 81 mg by mouth daily.      [name] atorvastatin (LIPITOR) 80 MG tablet Take 1 tablet (80 mg total) by mouth daily at 6 PM. 30 tablet 3  . Blood Glucose Monitoring Suppl (ONE TOUCH ULTRA SYSTEM KIT) W/DEVICE KIT Use to check sugar once to twice a day. Dx code: 250.00. 1 each 0  . glucose blood (BAYER CONTOUR NEXT TEST) test strip Use as instructed 100 each 12  . glucose blood (ONE TOUCH TEST STRIPS) test strip Use to check sugar once to twice a day. Dx code: 250.00. 100 each 12  . hydrochlorothiazide (HYDRODIURIL) 25 MG tablet TAKE 1 TABLET BY MOUTH DAILY. 30 tablet 3  . Lancets (ONETOUCH ULTRASOFT) lancets Use to check sugar once to twice a day. Dx code: 250.00. 100 each 12   No current facility-administered medications on file prior to visit.    Allergies: No Known Allergies  FH: No family history on file.  SH: Social History   Social History  . Marital Status: Married    Spouse Name: N/A  . Number of Children: N/A  . Years of Education: 15   Social History Main Topics  . Smoking status: Former Smoker -- 0.50 packs/day    Types: Cigarettes   Quit date: 02/06/2013  . Smokeless tobacco: None     Comment: stopped 3 months ago  . Alcohol Use: Yes     Comment: Wine  . Drug Use: None  . Sexual Activity: Not Asked   Other Topics Concern  . None   Social History Narrative    Review of Systems: Constitutional: Negative for fever, chills and weight loss.  Eyes: Negative for blurred vision.  Respiratory: Negative for cough and shortness of breath.  Cardiovascular: Negative for chest pain, palpitations and leg swelling.  Gastrointestinal: Negative for nausea, vomiting, abdominal pain, diarrhea, constipation and blood in stool.  Genitourinary: Negative for dysuria, urgency and frequency.  Musculoskeletal: Negative for myalgias and back pain.  Neurological: Negative for dizziness, weakness and headaches.     Objective:   Vital Signs: Filed Vitals:   06/22/15 1628  BP: 143/81  Pulse: 58  Temp: 97.6 F (36.4 C)  TempSrc: Oral  Weight: 177 lb 11.2 oz (80.604 kg)  SpO2: 100%      BP Readings from Last 3 Encounters:  06/22/15 143/81  10/13/14 149/83  04/14/14 131/82    Physical Exam: Constitutional: Vital signs reviewed.  Patient is in NAD and cooperative with exam.  Head: Normocephalic and atraumatic. Eyes: EOMI, conjunctivae nl, no scleral icterus.  Neck: Supple. Cardiovascular: RRR, no MRG. Pulmonary/Chest: normal effort, CTAB,  no wheezes, rales, or rhonchi. Abdominal: Soft. NT/ND +BS. Neurological: A&O x3, cranial nerves II-XII are grossly intact, moving all extremities. Extremities: 2+DP b/l; no pitting edema. R great toe: ingrown toenail, calluses.  Skin: Warm, dry and intact. No rash.   Assessment & Plan:   Assessment and plan was discussed and formulated with my attending.

## 2015-06-22 NOTE — Assessment & Plan Note (Signed)
A: Mild hypercalcemia Lab Results  Component Value Date   CALCIUM 10.8* 04/14/2014  P: Repeat CMP May need to check PTH at next OV, if remains elevated

## 2015-06-22 NOTE — Assessment & Plan Note (Addendum)
A: DMII Pt has been taking glipizide  daily after breakfast.  Checks his blood sugars once daily and run around 190s before or after breakfast.   P: HA1c 7.0 today. Discussed increasing glipizide to  bid which he agrees to Retinal scan today Foot exam completed today, referral to podiatry Check urine microalb f/u in 3 months

## 2015-06-22 NOTE — Assessment & Plan Note (Signed)
A: Resolved P:  Monitor

## 2015-06-22 NOTE — Assessment & Plan Note (Signed)
Pt refuses all vaccinations.  Refuses colonoscopy.  Does not agree to hepatitis C or HIV screening.

## 2015-06-23 LAB — LIPID PANEL
Chol/HDL Ratio: 4.7 ratio units (ref 0.0–5.0)
Cholesterol, Total: 210 mg/dL — ABNORMAL HIGH (ref 100–199)
HDL: 45 mg/dL (ref >39)
LDL Calculated: 133 mg/dL — ABNORMAL HIGH (ref 0–99)
Triglycerides: 160 mg/dL — ABNORMAL HIGH (ref 0–149)
VLDL Cholesterol Cal: 32 mg/dL (ref 5–40)

## 2015-06-23 LAB — CMP14 + ANION GAP
ALT: 33 IU/L (ref 0–44)
AST: 21 IU/L (ref 0–40)
Albumin/Globulin Ratio: 1.8 (ref 1.1–2.5)
Albumin: 4.4 g/dL (ref 3.5–5.5)
Alkaline Phosphatase: 68 IU/L (ref 39–117)
Anion Gap: 18 mmol/L (ref 10.0–18.0)
BUN/Creatinine Ratio: 18 (ref 9–20)
BUN: 15 mg/dL (ref 6–24)
Bilirubin Total: 0.2 mg/dL (ref 0.0–1.2)
CO2: 20 mmol/L (ref 18–29)
Calcium: 10.7 mg/dL — ABNORMAL HIGH (ref 8.7–10.2)
Chloride: 101 mmol/L (ref 96–106)
Creatinine, Ser: 0.85 mg/dL (ref 0.76–1.27)
GFR calc Af Amer: 114 mL/min/{1.73_m2} (ref >59)
GFR calc non Af Amer: 99 mL/min/{1.73_m2} (ref >59)
Globulin, Total: 2.5 g/dL (ref 1.5–4.5)
Glucose: 116 mg/dL — ABNORMAL HIGH (ref 65–99)
Potassium: 4.1 mmol/L (ref 3.5–5.2)
Sodium: 139 mmol/L (ref 134–144)
Total Protein: 6.9 g/dL (ref 6.0–8.5)

## 2015-06-23 LAB — MICROALBUMIN / CREATININE URINE RATIO
Creatinine, Urine: 175.8 mg/dL
MICROALB/CREAT RATIO: 2.5 mg/g creat (ref 0.0–30.0)
Microalbumin, Urine: 4.4 ug/mL

## 2015-06-24 ENCOUNTER — Other Ambulatory Visit: Payer: Self-pay | Admitting: Internal Medicine

## 2015-06-25 NOTE — Progress Notes (Signed)
Internal Medicine Clinic Attending  Case discussed with Dr. Gill soon after the resident saw the patient.  We reviewed the resident's history and exam and pertinent patient test results.  I agree with the assessment, diagnosis, and plan of care documented in the resident's note.  

## 2015-06-26 NOTE — Progress Notes (Signed)
Thank you :)

## 2015-07-08 ENCOUNTER — Encounter: Payer: Self-pay | Admitting: *Deleted

## 2015-07-14 ENCOUNTER — Ambulatory Visit (INDEPENDENT_AMBULATORY_CARE_PROVIDER_SITE_OTHER): Payer: BLUE CROSS/BLUE SHIELD

## 2015-07-14 ENCOUNTER — Encounter: Payer: Self-pay | Admitting: Podiatry

## 2015-07-14 ENCOUNTER — Ambulatory Visit (INDEPENDENT_AMBULATORY_CARE_PROVIDER_SITE_OTHER): Payer: BLUE CROSS/BLUE SHIELD | Admitting: Podiatry

## 2015-07-14 VITALS — BP 153/87 | HR 77 | Resp 16 | Ht 66.0 in | Wt 170.0 lb

## 2015-07-14 DIAGNOSIS — B353 Tinea pedis: Secondary | ICD-10-CM | POA: Diagnosis not present

## 2015-07-14 DIAGNOSIS — M79672 Pain in left foot: Secondary | ICD-10-CM | POA: Diagnosis not present

## 2015-07-14 DIAGNOSIS — M79671 Pain in right foot: Secondary | ICD-10-CM

## 2015-07-14 DIAGNOSIS — L600 Ingrowing nail: Secondary | ICD-10-CM | POA: Diagnosis not present

## 2015-07-14 DIAGNOSIS — E119 Type 2 diabetes mellitus without complications: Secondary | ICD-10-CM | POA: Diagnosis not present

## 2015-07-14 MED ORDER — KETOCONAZOLE 2 % EX CREA: 1.0000 "application " | TOPICAL_CREAM | Freq: Two times a day (BID) | CUTANEOUS | Status: DC

## 2015-07-14 MED ORDER — NEOMYCIN-POLYMYXIN-HC 1 % OT SOLN: OTIC | Status: DC

## 2015-07-14 MED FILL — NEO/POLYMYXIN/HC EAR SOLN: 3.5-10000-1 | 50 days supply | Qty: 10 | Fill #0

## 2015-07-14 MED FILL — KETOCONAZOLE 2% CREAM: 2 | 10 days supply | Qty: 15 | Fill #0

## 2015-07-14 NOTE — Patient Instructions (Signed)

## 2015-07-14 NOTE — Progress Notes (Signed)
   Subjective:    Patient ID: Gregory Sosa, male    DOB: Jun 05, 1960, 55 y.o.   MRN: [id number]  HPI: He presents today for chief complaint of a painful ingrown nail tibial border of the hallux right. He states that this is been like this for quite some time and he feels like it's ingrown. He also has diabetes states that his blood sugar runs around 126 but is doing very well.     Review of Systems  All other systems reviewed and are negative.      Objective:   Physical Exam:He presents today vital signs stable alert and oriented 3 pulses are strongly palpable. Neurologic sensorium is intact for Semmes-Weinstein monofilament. Deep tendon reflexes are intact bilateral muscle strength +5 over 5 dorsiflexion plantar flexors and inverters everters all intrinsic musculature is intact. Orthopedic evaluation and x-rays all joints to the ankle for range of motion without crepitus and radiographs demonstrate normal osseous architecture with mild hammertoe deformities. Mild pes planus. Cutaneous evaluation of a supple well-hydrated cutis sharply incurvated nail margin tibial border of the hallux right with erythema and edema.        Assessment & Plan:  Diabetes without complications. Ingrown nail tibial border hallux right.  Plan: Performed a chemical matrixectomy today after local anesthesia was induced her. He tolerated this procedure well. Provided him with both oral and written home going instructions for care and soaking of his twice daily he will apply Cortisporin otic as directed which was a prescription was provided today. I will follow-up with him in 1 week

## 2015-07-21 ENCOUNTER — Encounter: Payer: Self-pay | Admitting: Podiatry

## 2015-07-21 ENCOUNTER — Ambulatory Visit (INDEPENDENT_AMBULATORY_CARE_PROVIDER_SITE_OTHER): Payer: BLUE CROSS/BLUE SHIELD | Admitting: Podiatry

## 2015-07-21 DIAGNOSIS — L600 Ingrowing nail: Secondary | ICD-10-CM

## 2015-07-21 NOTE — Progress Notes (Signed)
He presents today for follow-up of his matrixectomy hallux right. He states it is feeling much better now than it was prior to surgery. He continues to soak in Betadine warm water and apply Cortisporin otic twice daily.  Objective: Vital signs are stable alert and oriented 3. Pulses are strongly palpable. Neurologic sensorium is intact per Semmes-Weinstein monofilament. Deep tendon reflexes are intact bilateral and muscle strength is normal. Cutaneous evaluation demonstrates supple well-hydrated cutis no erythema edema cellulitis drainage or odor to the tibial border of the hallux right.  Assessment: Well-healing surgical toe hallux right.  Plan: Discontinue Betadine soaks start with Epsom salts and warm water soaks covered in the daytime and leave open at bedtime. Continue Cortisporin otic twice daily.

## 2015-07-21 NOTE — Patient Instructions (Signed)

## 2015-07-24 ENCOUNTER — Telehealth: Payer: Self-pay | Admitting: Pharmacist

## 2015-07-24 DIAGNOSIS — E785 Hyperlipidemia, unspecified: Secondary | ICD-10-CM

## 2015-07-28 LAB — HM DIABETES EYE EXAM

## 2015-08-03 NOTE — Telephone Encounter (Signed)
Contacted patient to assist with cost of atorvastatin but he declined help at this time. We can consider switching to pravastatin if lower cost alternative needed in the future.

## 2015-08-05 ENCOUNTER — Telehealth: Payer: Self-pay | Admitting: Internal Medicine

## 2015-08-05 MED FILL — glipiZIDE 5 MG TABS: 5 | 30 days supply | Qty: 60 | Fill #0

## 2015-08-05 NOTE — Telephone Encounter (Signed)
Dr gill, [facility] calls to clarify your script of glipizide, it states take 1 twice daily but you only gave #30, i gave VO to change to #60, i hope you approve?

## 2015-08-05 NOTE — Telephone Encounter (Signed)
Verification of glipiZIDE (GLUCOTROL) 5 MG tablet

## 2015-08-09 NOTE — Telephone Encounter (Signed)
Sure, that is fine.   Thanks.

## 2015-08-11 ENCOUNTER — Ambulatory Visit (INDEPENDENT_AMBULATORY_CARE_PROVIDER_SITE_OTHER): Payer: BLUE CROSS/BLUE SHIELD | Admitting: Podiatry

## 2015-08-11 ENCOUNTER — Encounter: Payer: Self-pay | Admitting: Podiatry

## 2015-08-11 VITALS — BP 141/77 | HR 80 | Resp 16

## 2015-08-11 DIAGNOSIS — L03031 Cellulitis of right toe: Secondary | ICD-10-CM

## 2015-08-11 DIAGNOSIS — L03011 Cellulitis of right finger: Secondary | ICD-10-CM

## 2015-08-11 NOTE — Progress Notes (Signed)
Gregory Sosa presents today with concerns of hyperpigmentation around the nail or a chemical matrixectomy was performed to the tibial border of the right hallux. He states that the nail does not hurt at all and seems to have gone on to heal uneventfully he's just concerned about the discoloration and whether or not it will resolve.  Objective: Vital signs are stable he is alert and oriented 3 pulses to the right foot are noted to be immediate. He has postinflammatory hyperpigmentation along the medial aspect of the matrixectomy site and the proximal nail fold. There is no purulence no malodor no signs of infection.  Assessment: Well-healing matrixectomy with hypertrophic postinflammatory hyperpigmentation.  Plan: I encouraged him to continue soaking is also warm water and that this hyperpigmentation should go on to resolve uneventfully.

## 2015-08-24 MED ORDER — ATORVASTATIN CALCIUM 80 MG PO TABS: 80.0000 mg | ORAL_TABLET | Freq: Every day | ORAL | Status: DC

## 2015-08-24 NOTE — Addendum Note (Signed)
Addended by: Mliss FritzKIM, Trigg Delarocha J on: 08/24/2015 04:55 PM   Modules accepted: Orders

## 2015-09-01 ENCOUNTER — Ambulatory Visit: Payer: BLUE CROSS/BLUE SHIELD | Admitting: Podiatry

## 2015-09-07 ENCOUNTER — Ambulatory Visit (INDEPENDENT_AMBULATORY_CARE_PROVIDER_SITE_OTHER): Payer: BLUE CROSS/BLUE SHIELD | Admitting: Internal Medicine

## 2015-09-07 ENCOUNTER — Encounter: Payer: Self-pay | Admitting: Internal Medicine

## 2015-09-07 VITALS — BP 134/75 | HR 89 | Temp 98.2°F | Ht 66.0 in | Wt 179.2 lb

## 2015-09-07 DIAGNOSIS — Z7984 Long term (current) use of oral hypoglycemic drugs: Secondary | ICD-10-CM

## 2015-09-07 DIAGNOSIS — E1169 Type 2 diabetes mellitus with other specified complication: Secondary | ICD-10-CM

## 2015-09-07 DIAGNOSIS — N521 Erectile dysfunction due to diseases classified elsewhere: Secondary | ICD-10-CM | POA: Diagnosis not present

## 2015-09-07 DIAGNOSIS — I10 Essential (primary) hypertension: Secondary | ICD-10-CM | POA: Diagnosis not present

## 2015-09-07 DIAGNOSIS — Z0000 Encounter for general adult medical examination without abnormal findings: Secondary | ICD-10-CM

## 2015-09-07 DIAGNOSIS — E785 Hyperlipidemia, unspecified: Secondary | ICD-10-CM

## 2015-09-07 DIAGNOSIS — E118 Type 2 diabetes mellitus with unspecified complications: Secondary | ICD-10-CM

## 2015-09-07 NOTE — Assessment & Plan Note (Signed)
He again refuses all vaccinations and colonoscopy.  He was given stool cards at his last OV but states he lost them.  He states he may consider doing the stool cards at his next OV. -Mamie will mail him stool cards today

## 2015-09-07 NOTE — Patient Instructions (Signed)
Thank you for your visit today.   Please return to the internal medicine clinic in about 4-5 month(s) or sooner if needed.     I have made the following additions/changes to your medications:  Please continue to take glipizide  twice daily.  You do not need to check your blood sugar every day.  Please check your blood sugar if you feel symptoms of low blood sugar (please see below). I encourage you to take the lipitor daily in the evening for cholesterol. I also encourage you to take a baby aspirin daily. I will give you stool cards today.  Please mail these back. We want your blood pressure to be <140/90.    Please be sure to bring all of your medications with you to every visit; this includes herbal supplements, vitamins, eye drops, and any over-the-counter medications.   Should you have any questions regarding your medications and/or any new or worsening symptoms, please be sure to call the clinic at [phone].   If you believe that you are suffering from a life threatening condition or one that may result in the loss of limb or function, then you should call 911 and proceed to the nearest Emergency Department.   A healthy lifestyle and preventative care can promote health and wellness.   Maintain regular health, dental, and eye exams.  Eat a healthy diet. Foods like vegetables, fruits, whole grains, low-fat dairy products, and lean protein foods contain the nutrients you need without too many calories. Decrease your intake of foods high in solid fats, added sugars, and salt. Get information about a proper diet from your caregiver, if necessary.  Regular physical exercise is one of the most important things you can do for your health. Most adults should get at least 150 minutes of moderate-intensity exercise (any activity that increases your heart rate and causes you to sweat) each week. In addition, most adults need muscle-strengthening exercises on 2 or more days a week.    Maintain a healthy weight. The body mass index (BMI) is a screening tool to identify possible weight problems. It provides an estimate of body fat based on height and weight. Your caregiver can help determine your BMI, and can help you achieve or maintain a healthy weight. For adults 20 years and older:  A BMI below 18.5 is considered underweight.  A BMI of 18.5 to 24.9 is normal.  A BMI of 25 to 29.9 is considered overweight.  A BMI of 30 and above is considered obese.  Hypoglycemia Low blood sugar (hypoglycemia) means that the level of sugar in your blood is lower than it should be. Signs of low blood sugar include:  Getting sweaty.  Feeling hungry.  Feeling dizzy or weak.  Feeling sleepier than normal.  Feeling nervous.  Headaches.  Having a fast heartbeat. Low blood sugar can happen fast and can be an emergency. Your doctor can do tests to check your blood sugar level. You can have low blood sugar and not have diabetes. HOME CARE  Check your blood sugar as told by your doctor. If it is less than 70 mg/dl or as told by your doctor, take 1 of the following:  3 to 4 glucose tablets.   cup clear juice.   cup soda pop, not diet.  1 cup milk.  5 to 6 hard candies.  Recheck blood sugar after 15 minutes. Repeat until it is at the right level.  Eat a snack if it is more than 1 hour until  the next meal.  Only take medicine as told by your doctor.  Do not skip meals. Eat on time.  Do not drink alcohol except with meals.  Check your blood glucose before driving.  Check your blood glucose before and after exercise.  Always carry treatment with you, such as glucose pills.  Always wear a medical alert bracelet if you have diabetes. GET HELP RIGHT AWAY IF:   Your blood glucose goes below 70 mg/dl or as told by your doctor, and you:  Are confused.  Are not able to swallow.  Pass out (faint).  You cannot treat yourself. You may need someone to help  you.  You have low blood sugar problems often.  You have problems from your medicines.  You are not feeling better after 3 to 4 days.  You have vision changes. MAKE SURE YOU:   Understand these instructions.  Will watch this condition.  Will get help right away if you are not doing well or get worse.   This information is not intended to replace advice given to you by your health care provider. Make sure you discuss any questions you have with your health care provider.   Document Released: 07/20/2009 Document Revised: 05/16/2014 Document Reviewed: 12/30/2014 Elsevier Interactive Patient Education [employer]2016 Elsevier Inc.

## 2015-09-07 NOTE — Progress Notes (Signed)
Patient ID: Gregory Sosa, male   DOB: [birth year], 55 y.o.   MRN: [id number]     Subjective:   Patient ID: Gregory Sosa male    DOB: 20-Mar-1961 55 y.o.    MRN: [id number] Health Maintenance Due: There are no preventive care reminders to display for this patient.  _________________________________________________  HPI: [name] is a 55 y.o. male here for a follow up visit.  Pt has a PMH outlined below.  Please see problem-based charting assessment and plan for further status of patient's chronic medical problems addressed at today's visit.  PMH: Past Medical History  Diagnosis Date  . Hypertension     Medications: Current Outpatient Prescriptions on File Prior to Visit  Medication Sig Dispense Refill  . atorvastatin (LIPITOR) 80 MG tablet Take 1 tablet (80 mg total) by mouth daily. 30 tablet 3  . Blood Glucose Monitoring Suppl (ONE TOUCH ULTRA SYSTEM KIT) W/DEVICE KIT Use to check sugar once to twice a day. Dx code: 250.00. 1 each 0  . glipiZIDE (GLUCOTROL) 5 MG tablet Take 1 tablet (5 mg total) by mouth 2 (two) times daily before a meal. 30 tablet 3  . glucose blood (BAYER CONTOUR NEXT TEST) test strip Use as instructed 100 each 12  . glucose blood (ONE TOUCH TEST STRIPS) test strip Use to check sugar once to twice a day. Dx code: 250.00. 100 each 12  . ketoconazole (NIZORAL) 2 % cream Apply 1 application topically 2 (two) times daily. 15 g 2  . Lancets (ONETOUCH ULTRASOFT) lancets Use to check sugar once to twice a day. Dx code: 250.00. 100 each 12  . NEOMYCIN-POLYMYXIN-HYDROCORTISONE (CORTISPORIN) 1 % SOLN otic solution Apply 1-2 drops to toe BID after soaking 10 mL 1   No current facility-administered medications on file prior to visit.    Allergies: No Known Allergies  FH: History reviewed. No pertinent family history.  SH: Social History   Social History  . Marital Status: Married    Spouse Name: N/A  . Number of Children: N/A  . Years of Education: 64    Social History Main Topics  . Smoking status: Former Smoker -- 0.50 packs/day    Types: Cigarettes    Quit date: 02/06/2013  . Smokeless tobacco: None     Comment: stopped 3 months ago  . Alcohol Use: Yes     Comment: Wine  . Drug Use: None  . Sexual Activity: Not Asked   Other Topics Concern  . None   Social History Narrative    Review of Systems: Constitutional: Negative for fever, chills.  Eyes: Negative for blurred vision.  Respiratory: Negative for cough and shortness of breath.  Cardiovascular: Negative for chest pain.  Gastrointestinal: Negative for nausea, vomiting. Neurological: Negative for dizziness.   Objective:   Vital Signs: Filed Vitals:   09/07/15 1558  BP: 134/75  Pulse: 89  Temp: 98.2 F (36.8 C)  TempSrc: Oral  Height: 5' 6" (1.676 m)  Weight: 179 lb 3.2 oz (81.285 kg)  SpO2: 100%      BP Readings from Last 3 Encounters:  09/07/15 134/75  08/11/15 141/77  07/14/15 153/87    Physical Exam: Constitutional: Vital signs reviewed.  Patient is in NAD and cooperative with exam.  Head: Normocephalic and atraumatic. Eyes: EOMI, conjunctivae nl, no scleral icterus.  Neck: Supple. Cardiovascular: RRR, no MRG. Pulmonary/Chest: normal effort, CTAB, no wheezes, rales, or rhonchi. Abdominal: Soft. NT/ND +BS. Neurological: A&O x3, cranial nerves II-XII are grossly intact, moving all extremities.  Extremities: No LE edema.  Skin: Warm, dry and intact. No rash.   Assessment & Plan:   Assessment and plan was discussed and formulated with my attending.

## 2015-09-07 NOTE — Assessment & Plan Note (Addendum)
Last ha1c 7.0 and he was told to increase the glipizide to twice daily.  However, he is only taking 5mg  once daily.  His blood sugars were all above goal, with the average 195 and the lowest 131 (he had 32 readings).  He has also gained 9 lbs since his previous visit in Feb. so weight loss was discussed.  He does not want to take the glipizide twice daily worrying that his blood sugar may get too low.  We discussed the symptoms of hypoglycemia and he was provided information on his AVS.  I reminded him that it is really not necessary for him to be checking his blood sugars on oral meds only.  I also offered him the option of taking glipizide 10mg  XL but he did want to try that and stated he would take it twice daily.  Additionally, he is not taking atorvastatin even though his risk for CVD is ~21%.   -encouraged glipizide 5mg  bid -f/u in 4-5 months -encouraged weight loss -reminded him to check feet daily -encouraged ASA and atorvastatin compliance

## 2015-09-07 NOTE — Assessment & Plan Note (Addendum)
BP 134/75.  He has not been taking any meds for HTN recently.  He was previously on lisinopril (taken off d/t cough) and more recently HCTZ but does not want to take either medication currently.   -cont to monitor  -encouraged weight loss

## 2015-09-07 NOTE — Assessment & Plan Note (Signed)
He is supposed to be taking atorvastatin daily but he has not been.  I went over his 10 yr risk score of 21.5% of a MI/stroke and even printed this off for him.  He stated that his wife did not want him to take a statin because of all the side effects.  When asked specifically which side effects he was referring to he did not state any particular side effect.  He is also not taking an 81mg  ASA.   Lab Results  Component Value Date   CHOL 210* 06/22/2015   HDL 45 06/22/2015   LDLCALC 133* 06/22/2015   TRIG 160* 06/22/2015   CHOLHDL 4.7 06/22/2015   -encouraged him to take atorvastatin -I provided him with information on his risk score  -advised him to take 81mg  ASA as previously prescribed

## 2015-09-09 NOTE — Progress Notes (Signed)
Internal Medicine Clinic Attending  Case discussed with Dr. Gill at the time of the visit.  We reviewed the resident's history and exam and pertinent patient test results.  I agree with the assessment, diagnosis, and plan of care documented in the resident's note.  

## 2015-10-12 MED FILL — glipiZIDE 5 MG TABS: 5 | 30 days supply | Qty: 60 | Fill #1

## 2015-10-19 ENCOUNTER — Encounter: Payer: Self-pay | Admitting: *Deleted

## 2015-11-30 MED FILL — glipiZIDE 5 MG TABS: 5 | 30 days supply | Qty: 60 | Fill #2

## 2016-01-26 MED FILL — glipiZIDE 5 MG TABS: 5 | 30 days supply | Qty: 60 | Fill #3

## 2016-03-24 ENCOUNTER — Other Ambulatory Visit: Payer: Self-pay

## 2016-03-24 NOTE — Telephone Encounter (Signed)
glipiZIDE (GLUCOTROL) 5 MG tablet, Refill request @ [facility].

## 2016-03-25 ENCOUNTER — Other Ambulatory Visit: Payer: Self-pay | Admitting: *Deleted

## 2016-03-25 DIAGNOSIS — E118 Type 2 diabetes mellitus with unspecified complications: Secondary | ICD-10-CM

## 2016-03-25 MED ORDER — GLIPIZIDE 5 MG PO TABS: 5.0000 mg | ORAL_TABLET | Freq: Two times a day (BID) | ORAL | 0 refills | Status: DC

## 2016-03-25 MED FILL — glipiZIDE 5 MG TABS: 5 | 90 days supply | Qty: 180 | Fill #0

## 2016-03-29 NOTE — Telephone Encounter (Signed)
This has been answered and picked up Friday 11/17

## 2016-04-22 ENCOUNTER — Encounter: Payer: Self-pay | Admitting: Internal Medicine

## 2016-06-03 ENCOUNTER — Ambulatory Visit (INDEPENDENT_AMBULATORY_CARE_PROVIDER_SITE_OTHER): Payer: BLUE CROSS/BLUE SHIELD | Admitting: Internal Medicine

## 2016-06-03 VITALS — BP 161/90 | HR 80 | Temp 97.7°F | Wt 180.8 lb

## 2016-06-03 DIAGNOSIS — Z0000 Encounter for general adult medical examination without abnormal findings: Secondary | ICD-10-CM

## 2016-06-03 DIAGNOSIS — E1165 Type 2 diabetes mellitus with hyperglycemia: Secondary | ICD-10-CM

## 2016-06-03 DIAGNOSIS — I10 Essential (primary) hypertension: Secondary | ICD-10-CM | POA: Diagnosis not present

## 2016-06-03 DIAGNOSIS — Z87891 Personal history of nicotine dependence: Secondary | ICD-10-CM

## 2016-06-03 DIAGNOSIS — Z7984 Long term (current) use of oral hypoglycemic drugs: Secondary | ICD-10-CM | POA: Diagnosis not present

## 2016-06-03 DIAGNOSIS — Z9114 Patient's other noncompliance with medication regimen: Secondary | ICD-10-CM | POA: Diagnosis not present

## 2016-06-03 DIAGNOSIS — E785 Hyperlipidemia, unspecified: Secondary | ICD-10-CM

## 2016-06-03 DIAGNOSIS — IMO0001 Reserved for inherently not codable concepts without codable children: Secondary | ICD-10-CM

## 2016-06-03 LAB — POCT GLYCOSYLATED HEMOGLOBIN (HGB A1C): Hemoglobin A1C: 10

## 2016-06-03 LAB — GLUCOSE, CAPILLARY: Glucose-Capillary: 270 mg/dL — ABNORMAL HIGH (ref 65–99)

## 2016-06-03 MED ORDER — ROSUVASTATIN CALCIUM 20 MG PO TABS: 20.0000 mg | ORAL_TABLET | Freq: Every day | ORAL | 3 refills | Status: DC

## 2016-06-03 MED ORDER — HYDROCHLOROTHIAZIDE 12.5 MG PO TABS: 12.5000 mg | ORAL_TABLET | Freq: Every day | ORAL | 3 refills | Status: DC

## 2016-06-03 NOTE — Assessment & Plan Note (Addendum)
A1c 10.0 today from 7.0 in 06/2015. CBGs have been running in 2 to 300s consistently at home. Urine microalbumin normal at last check. He checks daily and brought glucometer readout. He has been taking his Glipizide 5 mg just once daily despite being prescribed BID for no particular reason. He reports decent diet and exercise 3 times weekly. We discussed that this is a very significant loss of glycemic control and that we would like to start a medication like Empagliflozin. He will not take Metformin due to intolerance. He is very hesitant to start more medications and insists that he will take the Glipizide BID and make significant lifestyle changes. Agrees to reassess in 3 months.   Plan: - Return in 3 months for repeat A1c and glucometer readout - Glipizide 5 mg BID - Strongly encouraged diet/exercise/weight loss modification - Consider starting Empagliflozin at next visit - Follow CMP checked today

## 2016-06-03 NOTE — Progress Notes (Signed)
   CC: Diabetes follow up  HPI:  [name] is a 56 y.o. male with PMHx detailed below presenting with no acute complaints. He is here for follow up of his diabetes. He has not been taking any medications except his Glipizide.   See problem based assessment and plan below for additional details.  Past Medical History:  Diagnosis Date  . Hypertension     Review of Systems: Review of Systems  Constitutional: Negative for chills, fever, malaise/fatigue and weight loss.  Respiratory: Negative for cough and shortness of breath.   Cardiovascular: Negative for chest pain and palpitations.  Gastrointestinal: Negative for abdominal pain, blood in stool, constipation, diarrhea, nausea and vomiting.  Psychiatric/Behavioral: Negative for depression. The patient is not nervous/anxious.   All other systems reviewed and are negative.    Physical Exam: Vitals:   06/03/16 1529  BP: (!) 161/90  Pulse: 80  Temp: 97.7 F (36.5 C)  TempSrc: Oral  SpO2: 98%  Weight: 180 lb 12.8 oz (82 kg)   Body mass index is 29.18 kg/m. GENERAL- Gentleman with bluetooth earpiece sitting comfortably in exam room chair, alert, in no distress HEENT- Atraumatic, PERRL, moist mucous membranes CARDIAC- Regular rate and rhythm, no murmurs, rubs or gallops. RESP- Clear to ascultation bilaterally, no wheezing or crackles, normal work of breathing ABDOMEN- Normoactive bowel sounds, soft, nontender, nondistended BACK- Normal curvature, no paraspinal tenderness NEURO- Alert and oriented, cranial nerves grossly intact EXTREMITIES- Normal bulk and range of motion, no edema, 2+ peripheral pulses SKIN- Warm, dry, intact, without visible rash PSYCH- Appropriate affect, clear speech, thoughts linear and goal-directed  Assessment & Plan:   See encounters tab for problem based medical decision making.  Patient discussed with Dr. Heide SparkNarendra

## 2016-06-03 NOTE — Assessment & Plan Note (Addendum)
Has been prescribed Lipitor in the past but he reports his wife is an Charity fundraiserN and "hates that medication" so he has not been taking it. We discussed his astronomic ASCVD risk and need to reduce his cholesterol. He agreed to try a statin other than Lipitor.  Plan: - Start Rosuvastatin 20 mg daily, consider increase to high intensity strength 40 mg in future if tolerating well - Follow lipid panel  Lipid Panel     Component Value Date/Time   CHOL 210 (H) 06/22/2015 1627   TRIG 160 (H) 06/22/2015 1627   HDL 45 06/22/2015 1627   CHOLHDL 4.7 06/22/2015 1627   CHOLHDL 4.5 04/14/2014 1558   VLDL 44 (H) 04/14/2014 1558   LDLCALC 133 (H) 06/22/2015 1627

## 2016-06-03 NOTE — Assessment & Plan Note (Signed)
Refused flu vaccine.

## 2016-06-03 NOTE — Assessment & Plan Note (Addendum)
Initial BP 161/90, on recheck approx 144/92. Poorly controlled T2DM. Goal < 130/80. Has been prescribed Lisinopril and HCTZ in the past but has never taken them consistently. Reported cough with Lisinopril. Discussed that his ASCVD risk is very high and that he will have a stroke or MI if his BP, cholesterol, and diabetes are not kept in check. He voiced understanding and although he is very hesitant to take medications, agreed to starting an antihypertensive.   Plan: - Start HCTZ 12.5 mg daily - BMP at follow up visit

## 2016-06-03 NOTE — Patient Instructions (Signed)
Thank you for visiting today!  Please make sure to take the Glipizide twice daily.  Please avoid carbs and high calorie foods and drink, and try to lose weight.  Please start taking Hydrochlorothiazide 12.5 mg daily for blood pressure.  Please start taking Rosuvastatin 20 mg daily for high cholesterol.  We will see you in three months to check on your diabetes.

## 2016-06-04 LAB — CMP14 + ANION GAP
ALT: 47 IU/L — ABNORMAL HIGH (ref 0–44)
AST: 25 IU/L (ref 0–40)
Albumin/Globulin Ratio: 1.6 (ref 1.2–2.2)
Albumin: 4.3 g/dL (ref 3.5–5.5)
Alkaline Phosphatase: 74 IU/L (ref 39–117)
Anion Gap: 18 mmol/L (ref 10.0–18.0)
BUN/Creatinine Ratio: 12 (ref 9–20)
BUN: 12 mg/dL (ref 6–24)
Bilirubin Total: 0.4 mg/dL (ref 0.0–1.2)
CO2: 21 mmol/L (ref 18–29)
Calcium: 10.6 mg/dL — ABNORMAL HIGH (ref 8.7–10.2)
Chloride: 99 mmol/L (ref 96–106)
Creatinine, Ser: 0.97 mg/dL (ref 0.76–1.27)
GFR calc Af Amer: 101 mL/min/{1.73_m2} (ref >59)
GFR calc non Af Amer: 88 mL/min/{1.73_m2} (ref >59)
Globulin, Total: 2.7 g/dL (ref 1.5–4.5)
Glucose: 274 mg/dL — ABNORMAL HIGH (ref 65–99)
Potassium: 4.5 mmol/L (ref 3.5–5.2)
Sodium: 138 mmol/L (ref 134–144)
Total Protein: 7 g/dL (ref 6.0–8.5)

## 2016-06-04 LAB — LIPID PANEL
Chol/HDL Ratio: 5 ratio units (ref 0.0–5.0)
Cholesterol, Total: 227 mg/dL — ABNORMAL HIGH (ref 100–199)
HDL: 45 mg/dL (ref >39)
LDL Calculated: 146 mg/dL — ABNORMAL HIGH (ref 0–99)
Triglycerides: 179 mg/dL — ABNORMAL HIGH (ref 0–149)
VLDL Cholesterol Cal: 36 mg/dL (ref 5–40)

## 2016-06-06 NOTE — Progress Notes (Signed)
Internal Medicine Clinic Attending  Case discussed with Dr. Johnson at the time of the visit.  We reviewed the resident's history and exam and pertinent patient test results.  I agree with the assessment, diagnosis, and plan of care documented in the resident's note.  

## 2016-09-01 ENCOUNTER — Telehealth: Payer: Self-pay | Admitting: Internal Medicine

## 2016-09-01 NOTE — Telephone Encounter (Signed)
APT. REMINDER CALL, LMTCB °

## 2016-09-02 ENCOUNTER — Encounter: Payer: Self-pay | Admitting: Internal Medicine

## 2016-09-02 ENCOUNTER — Ambulatory Visit (INDEPENDENT_AMBULATORY_CARE_PROVIDER_SITE_OTHER): Payer: BLUE CROSS/BLUE SHIELD | Admitting: Internal Medicine

## 2016-09-02 VITALS — BP 158/79 | HR 69 | Temp 97.4°F | Ht 66.0 in | Wt 175.7 lb

## 2016-09-02 DIAGNOSIS — Z6828 Body mass index (BMI) 28.0-28.9, adult: Secondary | ICD-10-CM | POA: Diagnosis not present

## 2016-09-02 DIAGNOSIS — Z9114 Patient's other noncompliance with medication regimen: Secondary | ICD-10-CM | POA: Diagnosis not present

## 2016-09-02 DIAGNOSIS — E1165 Type 2 diabetes mellitus with hyperglycemia: Secondary | ICD-10-CM | POA: Diagnosis not present

## 2016-09-02 DIAGNOSIS — Z7984 Long term (current) use of oral hypoglycemic drugs: Secondary | ICD-10-CM

## 2016-09-02 DIAGNOSIS — Z87891 Personal history of nicotine dependence: Secondary | ICD-10-CM | POA: Diagnosis not present

## 2016-09-02 DIAGNOSIS — IMO0001 Reserved for inherently not codable concepts without codable children: Secondary | ICD-10-CM

## 2016-09-02 DIAGNOSIS — I10 Essential (primary) hypertension: Secondary | ICD-10-CM | POA: Diagnosis not present

## 2016-09-02 LAB — GLUCOSE, CAPILLARY: Glucose-Capillary: 213 mg/dL — ABNORMAL HIGH (ref 65–99)

## 2016-09-02 LAB — POCT GLYCOSYLATED HEMOGLOBIN (HGB A1C): Hemoglobin A1C: 8.2

## 2016-09-02 MED ORDER — GLIPIZIDE ER 10 MG PO TB24
10.0000 mg | ORAL_TABLET | Freq: Every day | ORAL | 3 refills | Status: DC
Start: 2016-09-02 — End: 2017-02-22

## 2016-09-02 MED FILL — glipiZIDE XL 10 MG TB24: 10 | 30 days supply | Qty: 30 | Fill #0

## 2016-09-02 NOTE — Progress Notes (Signed)
   CC: DM follow up  HPI:  [name] is a 56 y.o. male with PMHx detailed below presenting for follow up of his diabetes.  See problem based assessment and plan below for additional details.  Past Medical History:  Diagnosis Date  . Diabetes mellitus type 2, uncontrolled (HCC) 03/23/2011  . Dyslipidemia 03/23/2011   21.5% 10-year risk of heart disease or stroke.     [name] Hypertension     Review of Systems: Review of Systems  Constitutional: Negative for chills, fever, malaise/fatigue and weight loss.  Eyes: Negative for blurred vision.  Respiratory: Negative for cough and shortness of breath.   Cardiovascular: Negative for chest pain and palpitations.  Gastrointestinal: Negative for abdominal pain, constipation, diarrhea, heartburn and nausea.  Genitourinary: Negative for frequency.  All other systems reviewed and are negative.    Physical Exam: Vitals:   09/02/16 1323  BP: (!) 158/79  Pulse: 69  Temp: 97.4 F (36.3 C)  TempSrc: Oral  SpO2: 100%  Weight: 175 lb 11.2 oz (79.7 kg)  Height:  (1.676 m)   Body mass index is 28.36 kg/m. GENERAL- Man wearing earpiece sitting comfortably in exam room chair, alert, in no distress HEENT- Atraumatic, moist mucous membranes CARDIAC- Regular rate and rhythm, no murmurs, rubs or gallops. RESP- Clear to ascultation bilaterally, no wheezing or crackles, normal work of breathing ABDOMEN- Soft, nontender, nondistended NEURO- Alert and oriented EXTREMITIES- Normal bulk and range of motion, no edema, 2+ peripheral pulses SKIN- Warm, dry, intact PSYCH- Appropriate affect, clear speech, thoughts linear and goal-directed  Assessment & Plan:   See encounters tab for problem based medical decision making.  Patient discussed with Dr. Rogelia Boga

## 2016-09-02 NOTE — Assessment & Plan Note (Addendum)
Reports he has only been taking Glipizide 5 mg once daily and he has been inconsistently checking his CBGs, typically before dinner, and they tend to run 180 to 200s. A1c today is 8.2 from 10.0 three months ago, which is surprising given that he reports essentially been taking the same dose of Glipizide as before. He does not report any significantly lifestyle changes. I suspect he was not taking the Glipizide at all prior to last visit. He does not grasp the importance of keeping his diabetes well-controlled and states that he "feels fine." Discussed the long term complications and importance of this extensively with him. Offered scheduling a diabetes education session with Lupita Leash which he declined.  Plan: -- Change to Glipizide XL and increase dose to 10 mg, once daily -- Check urine microalbumin and BMP today -- Ordered diabetic eye exam -- Provided UpToDate article on diabetes (patient info) -- Recheck A1c in 3 months

## 2016-09-02 NOTE — Assessment & Plan Note (Signed)
BP 161/90 today, reports he hasn't been taking the HCTZ prescribed last visit. Gives no clear reason aside from that he does not like taking medications. Discussed the importance of blood pressure control with him again and he voiced understanding and stated willingness to take the medication.  Plan: -- HCTZ 12.5 mg daily

## 2016-09-02 NOTE — Patient Instructions (Addendum)
We have changed your Glipizide to an extended release formulation that you only need to take once daily. We have also increased the dose to 10 mg daily.  It is very important that you take your Hydrochlorothiazide (Hydrodiuril) and Rosuvastatin (Crestor) daily as well. Getting your high blood pressure and cholesterol under control will significantly decreased your risk of heart attack and stroke in the near future.   Please return in 3 months to follow up your diabetes control.  We will schedule a diabetic eye exam in the near future.

## 2016-09-03 LAB — BMP8+ANION GAP
Anion Gap: 17 mmol/L (ref 10.0–18.0)
BUN/Creatinine Ratio: 14 (ref 9–20)
BUN: 12 mg/dL (ref 6–24)
CO2: 24 mmol/L (ref 18–29)
Calcium: 10.6 mg/dL — ABNORMAL HIGH (ref 8.7–10.2)
Chloride: 97 mmol/L (ref 96–106)
Creatinine, Ser: 0.88 mg/dL (ref 0.76–1.27)
GFR calc Af Amer: 112 mL/min/{1.73_m2} (ref >59)
GFR calc non Af Amer: 97 mL/min/{1.73_m2} (ref >59)
Glucose: 218 mg/dL — ABNORMAL HIGH (ref 65–99)
Potassium: 4.3 mmol/L (ref 3.5–5.2)
Sodium: 138 mmol/L (ref 134–144)

## 2016-09-03 LAB — MICROALBUMIN / CREATININE URINE RATIO
Creatinine, Urine: 163.8 mg/dL
Microalb/Creat Ratio: 5.3 mg/g creat (ref 0.0–30.0)
Microalbumin, Urine: 8.7 ug/mL

## 2016-09-05 NOTE — Progress Notes (Signed)
Internal Medicine Clinic Attending  Case discussed with Dr. Johnson at the time of the visit.  We reviewed the resident's history and exam and pertinent patient test results.  I agree with the assessment, diagnosis, and plan of care documented in the resident's note.  

## 2016-10-17 ENCOUNTER — Encounter: Payer: Self-pay | Admitting: *Deleted

## 2016-11-18 MED FILL — glipiZIDE XL 10 MG TB24: 10 | 30 days supply | Qty: 30 | Fill #1

## 2016-12-06 MED FILL — AMOXICILLIN 500 MG CAPSULE: 500 | 7 days supply | Qty: 21 | Fill #0

## 2016-12-21 MED FILL — glipiZIDE XL 10 MG TB24: 10 | 30 days supply | Qty: 30 | Fill #2

## 2017-01-19 ENCOUNTER — Ambulatory Visit (INDEPENDENT_AMBULATORY_CARE_PROVIDER_SITE_OTHER): Payer: BLUE CROSS/BLUE SHIELD | Admitting: Internal Medicine

## 2017-01-19 DIAGNOSIS — I10 Essential (primary) hypertension: Secondary | ICD-10-CM | POA: Diagnosis not present

## 2017-01-19 DIAGNOSIS — E119 Type 2 diabetes mellitus without complications: Secondary | ICD-10-CM

## 2017-01-19 DIAGNOSIS — Z87891 Personal history of nicotine dependence: Secondary | ICD-10-CM

## 2017-01-19 DIAGNOSIS — M79674 Pain in right toe(s): Secondary | ICD-10-CM | POA: Diagnosis not present

## 2017-01-19 DIAGNOSIS — E785 Hyperlipidemia, unspecified: Secondary | ICD-10-CM

## 2017-01-19 MED ORDER — LOSARTAN POTASSIUM 50 MG PO TABS: 50.0000 mg | ORAL_TABLET | Freq: Every day | ORAL | 1 refills | Status: DC

## 2017-01-19 MED ORDER — EZETIMIBE 10 MG PO TABS: 10.0000 mg | ORAL_TABLET | Freq: Every day | ORAL | 1 refills | Status: DC

## 2017-01-19 MED FILL — glipiZIDE XL 10 MG TB24: 10 | 30 days supply | Qty: 30 | Fill #3

## 2017-01-19 NOTE — Progress Notes (Signed)
    CC: HTN, DM, HLD, toe pain HPI: Mr.Maxximus Katrinka BlazingSmith is a 56 y.o. man with PMH noted below here for HTN, DM, HLD, toe pain  Please see Problem List/A&P for the status of the patient's chronic medical problems   Past Medical History:  Diagnosis Date  . Diabetes mellitus type 2, uncontrolled (HCC) 03/23/2011  . Dyslipidemia 03/23/2011   21.5% 10-year risk of heart disease or stroke.     [name]. Hypertension     Review of Systems: Denies fevers, chills,  Denies cough, SOB, wheezing Denies n/v/abd pain Denies joint pain Has toe pain   Physical Exam: Vitals:   01/19/17 1329  BP: (!) 146/86    General: A&O, in NAD CV: RRR, normal s1, s2, no m/r/g,  Resp: equal and symmetric breath sounds, no wheezing or crackles  Abdomen: soft, nontender, nondistended, +BS Skin: warm, dry, intact, no open lesions or rashes noted Extremities: pulses intact b/l, no edema,          Right 3rd toe mildly tender to palpation , no erythema, warmth or tenderness to suggest cellulitis- no skin breakdown  Assessment & Plan:   See encounters tab for problem based medical decision making. Patient discussed with Dr. Criselda PeachesMullen

## 2017-01-19 NOTE — Assessment & Plan Note (Signed)
Asymptomatic hypercalcemia has been noted on the prior cmets/bmet with normal albumin. Last time it was checked in April, and patient was not taking hctz, which is known to cause mild hypercalcemia.  Given that this is repeated on prior labwork, will check intact PTH, as well as repeat CMET.  Patient declined any labwork today  Plan -check PTH and CMET at the next visit  -hctz has been stopped

## 2017-01-19 NOTE — Assessment & Plan Note (Signed)
Several residents have discussed with this pt about the importance of statins and pt declined in the past. He told me he was not taking crestor because he does not trust the statins. I explained the high ASCVD score.   I then offered Zetia and pt is willing to try it.  Plan -start zetia daily  -follow up next month to re-assess

## 2017-01-19 NOTE — Patient Instructions (Signed)
Thank you for your visit today  Please start the losartan for your blood pressure, and start the ezetimibe (zetia) for your cholesterol  Please follow up next month with your PCP  Please wear loose shows, and take tylenol or ibuprofen for your pain. It is important to know that you are at higher risk for having diabetic foot infection so please take proper precautions

## 2017-01-19 NOTE — Assessment & Plan Note (Signed)
BP Readings from Last 3 Encounters:  01/19/17 (!) 146/86  09/02/16 (!) 158/79  06/03/16 (!) 161/90   Patient was hypertensive again today- He says he has not been taking hydrochlorothiazide and never picked it up. I explained that his goal is < 130/80 and the complications of long standing untreated hypertension- he understood.  His 'allergy' to lisinopril was cough. So would stop hctz and start losartan.  Plan -start losartan 50 mg daily -follow up next month to re-assess BP and CMET with PCP

## 2017-01-19 NOTE — Assessment & Plan Note (Signed)
Patient is here for 2 week history of mild pain on the right 3rd toe. Says he started wearing shoes 3 weeks ago, and he thinks that is how the pain started. He denies any neuropathy. On exam, Right 3rd toe mildly tender to palpation , no erythema, warmth or tenderness to suggest cellulitis- no skin breakdown. Pulses are intact.  A: Toe swelling due to tight shoes  P -asked pt to wear wider shoes -explained that he is at high risk for having diabetic foot infection and to keep and eye on this -tylenol or ibuprofen for pain

## 2017-02-01 NOTE — Progress Notes (Signed)
Internal Medicine Clinic Attending  Case discussed with Dr. Saraiya at the time of the visit.  We reviewed the resident's history and exam and pertinent patient test results.  I agree with the assessment, diagnosis, and plan of care documented in the resident's note.  

## 2017-02-01 NOTE — Addendum Note (Signed)
Addended by: Debe Coder B on: 02/01/2017 11:55 AM   Modules accepted: Level of Service

## 2017-02-22 ENCOUNTER — Other Ambulatory Visit: Payer: Self-pay | Admitting: *Deleted

## 2017-02-22 DIAGNOSIS — E1165 Type 2 diabetes mellitus with hyperglycemia: Principal | ICD-10-CM

## 2017-02-22 DIAGNOSIS — IMO0001 Reserved for inherently not codable concepts without codable children: Secondary | ICD-10-CM

## 2017-02-23 MED ORDER — GLIPIZIDE ER 10 MG PO TB24: 10.0000 mg | ORAL_TABLET | Freq: Every day | ORAL | 3 refills | Status: DC

## 2017-02-23 MED FILL — glipiZIDE XL 10 MG TB24: 10 | 30 days supply | Qty: 30 | Fill #0

## 2017-03-03 ENCOUNTER — Ambulatory Visit (INDEPENDENT_AMBULATORY_CARE_PROVIDER_SITE_OTHER): Payer: BLUE CROSS/BLUE SHIELD | Admitting: Internal Medicine

## 2017-03-03 VITALS — BP 155/79 | HR 63 | Temp 97.6°F | Wt 177.4 lb

## 2017-03-03 DIAGNOSIS — E785 Hyperlipidemia, unspecified: Secondary | ICD-10-CM | POA: Diagnosis not present

## 2017-03-03 DIAGNOSIS — Z1211 Encounter for screening for malignant neoplasm of colon: Secondary | ICD-10-CM

## 2017-03-03 DIAGNOSIS — Z87891 Personal history of nicotine dependence: Secondary | ICD-10-CM | POA: Diagnosis not present

## 2017-03-03 DIAGNOSIS — I10 Essential (primary) hypertension: Secondary | ICD-10-CM

## 2017-03-03 DIAGNOSIS — Z7984 Long term (current) use of oral hypoglycemic drugs: Secondary | ICD-10-CM | POA: Diagnosis not present

## 2017-03-03 DIAGNOSIS — E119 Type 2 diabetes mellitus without complications: Secondary | ICD-10-CM

## 2017-03-03 DIAGNOSIS — E1165 Type 2 diabetes mellitus with hyperglycemia: Secondary | ICD-10-CM

## 2017-03-03 LAB — GLUCOSE, CAPILLARY: Glucose-Capillary: 131 mg/dL — ABNORMAL HIGH (ref 65–99)

## 2017-03-03 LAB — POCT GLYCOSYLATED HEMOGLOBIN (HGB A1C): Hemoglobin A1C: 7.8

## 2017-03-03 NOTE — Assessment & Plan Note (Signed)
BP not at goal, 155/79. Patient was started on losartan 50 mg QD on previous visit 9/13 but reports never started taking it. States he prefers to try low sodium diet prior to starting any medication. Takes  BP at home and reports readings in the 130s/80s. Cr 0.88 08/2016. Denies HA, changes in vision, chest pain, shortness of breath, and lower extremity swelling.   HTN: chronic, stable, and uncontrolled (based on clinic readings) - Educated patient on long term complications of uncontrolled HTN including risk of developing kidney disease, cardiac disease, and stroke. Patient voiced understanding and states he would prefer to try lifestyle modifications first. [facility] if decides to start medication.  - Follow up in 3 months

## 2017-03-03 NOTE — Assessment & Plan Note (Addendum)
Patient has been persistent hypercalcemic since 07/2012. He denies fatigue, decreased appetite, abdominal pain, myalgias, arthralgias, constipation, and urinary symptoms. Ca 11-> 10.8-> 10.6. No albumin recorded. Denies multivitamin use. Last TSH 2.2 in 2012. Differential diagnosis includes primary hyperparathyroidism, elevated vitamin D, hyperthyroidism, and granulomatous disorders such as sarcoidosis. Low suspicion for malignancy as patient does not report B symptoms. He does have a history of smoking, but quit in 2014. He does not take any medications associated with hypercalcemia such as thiazide diuretics. No recent prolonged period of immobilization. No other abnormalities in lab work to suspect adrenal insufficiency. Will order CMET to assess for Ca corrected for albumin and PTH to assess for parathyroid abnormalities.   - Follow up PTH and CMET  - Will call patient if results are abnormal

## 2017-03-03 NOTE — Progress Notes (Signed)
   CC: HTN, T2DM, HLD, and hypercalcemia follow-up   HPI:  [name] is a 56 y.o. male with past medical history as described below who presents to clinic for HTN, T2DM, HLD, and hypercalcemia follow-up. Please see problem based assessment and plan for further details.   Past Medical History:  Diagnosis Date  . Diabetes mellitus type 2, uncontrolled (HCC) 03/23/2011  . Dyslipidemia 03/23/2011   21.5% 10-year risk of heart disease or stroke.     [name]. Hypertension    Review of Systems:   Review of Systems  Constitutional: Negative for chills, fever, malaise/fatigue and weight loss.  Eyes: Negative for blurred vision and double vision.  Respiratory: Negative for cough and shortness of breath.   Cardiovascular: Negative for chest pain, palpitations and leg swelling.  Gastrointestinal: Negative for abdominal pain, blood in stool, constipation, diarrhea, heartburn, melena, nausea and vomiting.  Genitourinary: Negative for dysuria, flank pain, frequency, hematuria and urgency.  Musculoskeletal: Negative for back pain, joint pain and myalgias.  Neurological: Negative for dizziness, loss of consciousness and headaches.  All other systems reviewed and are negative.   Physical Exam:  Vitals:   03/03/17 1617  BP: (!) 155/79  Pulse: 63  Temp: 97.6 F (36.4 C)  TempSrc: Oral  SpO2: 100%  Weight: 177 lb 6.4 oz (80.5 kg)   General: very pleasant male, well-nourished, well-developed, sitting up in chair in no acute distress  HENT: NCAT, neck supple and FROM, moist MMM, OP clear without exudates or erythema, nl dentition  Cardiac: regular rate and rhythm, nl S1/S2, no murmurs, rubs or gallops  Pulm: CTAB, no wheezes or crackles, no increased work of breathing  Abd: soft, NTND, bowel sounds present Neuro: A&Ox3, able to move all four extremities, no focal deficits noted   Ext: warm and well perfused, no peripheral edema, 2+ DP pulses bilaterally  Derm: no rashes or ulcers noted     Assessment & Plan:   See Encounters Tab for problem based charting.  Patient seen with Dr. Rogelia Sosa

## 2017-03-03 NOTE — Assessment & Plan Note (Signed)
Patient takes glipizide 10 mg QD and reports compliance. He was previously on metformin which was discontinued due to GI side effects. His A1c today is 7.8, improved from 8.2 in 08/2016. He denied polyuria, polydipsia, and symptoms of hypoglycemia. States he has been cutting back sweets in his diet.   - Continue glipizide 10 mg QD  - Follow up in 3 months  - Foot exam performed today  - Patient declined immunizations today

## 2017-03-03 NOTE — Patient Instructions (Addendum)
It was nice to meet you today, [name].   Please continue working on your diet and exercise. You are doing a great job! Your A1c today was 7.8, which is better than last time.   I will give you a call if the results from your blood tests are not normal.   Please make a follow up appointment with me in 3 months.

## 2017-03-03 NOTE — Assessment & Plan Note (Signed)
Patient was prescribed ezetimibe 9/13, but reports never taking it. States he rather try lifestyle modifications to lower cholesterol. ASCVD 29.8%. Discussed with patient 10-year risk of cardiovascular event. Patient voiced understanding and declined therapy.   - Will continue to monitor  - Advised [facility] if he would like to start medical therapy in the future  - Follow up in 3 months

## 2017-03-03 NOTE — Assessment & Plan Note (Signed)
Patient has never had a colonoscopy in the past. Denies family history of colorectal cancer. Discussed with patients options for CRC screening and patient agreeable to do FOBT. FOBT card provided today. Patient advised to return stool card to lab.   - Follow up FOBT card results

## 2017-03-04 LAB — CMP14 + ANION GAP
ALT: 44 IU/L (ref 0–44)
AST: 21 IU/L (ref 0–40)
Albumin/Globulin Ratio: 1.4 (ref 1.2–2.2)
Albumin: 4.2 g/dL (ref 3.5–5.5)
Alkaline Phosphatase: 72 IU/L (ref 39–117)
Anion Gap: 14 mmol/L (ref 10.0–18.0)
BUN/Creatinine Ratio: 16 (ref 9–20)
BUN: 14 mg/dL (ref 6–24)
Bilirubin Total: 0.2 mg/dL (ref 0.0–1.2)
CO2: 21 mmol/L (ref 20–29)
Calcium: 11.4 mg/dL — ABNORMAL HIGH (ref 8.7–10.2)
Chloride: 103 mmol/L (ref 96–106)
Creatinine, Ser: 0.86 mg/dL (ref 0.76–1.27)
GFR calc Af Amer: 112 mL/min/{1.73_m2} (ref >59)
GFR calc non Af Amer: 97 mL/min/{1.73_m2} (ref >59)
Globulin, Total: 2.9 g/dL (ref 1.5–4.5)
Glucose: 133 mg/dL — ABNORMAL HIGH (ref 65–99)
Potassium: 4 mmol/L (ref 3.5–5.2)
Sodium: 138 mmol/L (ref 134–144)
Total Protein: 7.1 g/dL (ref 6.0–8.5)

## 2017-03-04 LAB — PTH, INTACT AND CALCIUM: PTH: 59 pg/mL (ref 15–65)

## 2017-03-08 NOTE — Progress Notes (Signed)
Internal Medicine Clinic Attending  I saw and evaluated the patient.  I personally confirmed the key portions of the history and exam documented by Dr. Santos-Sanchez and I reviewed pertinent patient test results.  The assessment, diagnosis, and plan were formulated together and I agree with the documentation in the resident's note. 

## 2017-03-14 ENCOUNTER — Telehealth: Payer: Self-pay | Admitting: Internal Medicine

## 2017-03-14 NOTE — Telephone Encounter (Signed)
PLEASE CALL PATIENT  

## 2017-03-27 MED FILL — glipiZIDE ER 10 MG TB24: 10 | 30 days supply | Qty: 30 | Fill #1

## 2017-04-07 NOTE — Addendum Note (Signed)
Addended by: Remus BlakeBARROW, Yaretzi Ernandez K on: 04/07/2017 10:49 AM   Modules accepted: Orders

## 2017-04-25 MED FILL — glipiZIDE ER 10 MG TB24: 10 | 30 days supply | Qty: 30 | Fill #2

## 2017-05-29 MED FILL — glipiZIDE ER 10 MG TB24: 10 | 30 days supply | Qty: 30 | Fill #3

## 2017-06-13 ENCOUNTER — Encounter: Payer: Self-pay | Admitting: Internal Medicine

## 2017-06-13 ENCOUNTER — Ambulatory Visit: Payer: BLUE CROSS/BLUE SHIELD | Admitting: Internal Medicine

## 2017-06-13 VITALS — BP 151/80 | HR 78 | Temp 97.5°F | Ht 66.0 in | Wt 180.0 lb

## 2017-06-13 DIAGNOSIS — Z5329 Procedure and treatment not carried out because of patient's decision for other reasons: Secondary | ICD-10-CM

## 2017-06-13 DIAGNOSIS — Z87891 Personal history of nicotine dependence: Secondary | ICD-10-CM | POA: Diagnosis not present

## 2017-06-13 DIAGNOSIS — S80821A Blister (nonthermal), right lower leg, initial encounter: Secondary | ICD-10-CM

## 2017-06-13 DIAGNOSIS — I10 Essential (primary) hypertension: Secondary | ICD-10-CM | POA: Diagnosis not present

## 2017-06-13 DIAGNOSIS — X58XXXA Exposure to other specified factors, initial encounter: Secondary | ICD-10-CM

## 2017-06-13 DIAGNOSIS — S81801A Unspecified open wound, right lower leg, initial encounter: Secondary | ICD-10-CM | POA: Insufficient documentation

## 2017-06-13 NOTE — Assessment & Plan Note (Addendum)
Discussed lab results from 02/2017 with patient. Calcium elevated at 11.4 and PTH inappropriately high at 59.  He continues to be asymptomatic. Discussed etiologies for his hypercalcemia include primary hyperparathyroidism and familial hypocalciuric hypercalcemia and that a 24-hour urine calcium test would help distinguish between these two. Discussed with patient importance of this as he would be a candidate for surgery given Ca> 1.0 mg/dL upper limit of normal if the etiology of his hypercalcemia is primary hyperparathyroidism. However, patient declined further testing and stated he would consider it in the future if he developes symptoms. Reviewed symptoms of hypercalcemia with patient and advised him to call clinic if he decides to complete a 24-hour U calcium test.   - Educated patient on symptoms of hypercalcemia. Advised to call clinic at any time if he would like to proceed with further testing  - Will continue to monitor

## 2017-06-13 NOTE — Patient Instructions (Signed)
It was nice to see you today, [name].   Please make a follow up appointment with me in 3 months. Make sure to bring your blood pressure monitor and cough.   Please let me know if you decide to get the urine test we spoke about so that I can order it in the future.   For your blister, keep an eye on it. Use warm compresses on it. If you start getting fever or chills, or if you notice is getting more red and warm call clinic and schedule a same day appoinment.   Please call the clinic if you have any questions.

## 2017-06-13 NOTE — Assessment & Plan Note (Addendum)
Patient states he noticed a small blister in his R calf about 5-6 days ago. He initially experienced some pain, but denies any pain at this time. No difficulty ambulating. States the blister popped 2 days ago and had some mild purulent drainage, but mostly clear fluid. It has continued to heal since then. He denies fever and chills. On exam, lesion is healing well. There is mild erythema around the lesion but no warmth, induration, fluctuance or drainage noted. Advised patient to continue to monitor and return to clinic if he develops systemic symptoms concerning for infection.   - Continue to monitor

## 2017-06-13 NOTE — Progress Notes (Signed)
   CC: HTN follow up   HPI:  [name] is a 57 y.o. male with PMH listed below who presents to clinic for HTN follow up. Please see problem based assessment and plan for further details.   Past Medical History:  Diagnosis Date  . Diabetes mellitus type 2, uncontrolled (HCC) 03/23/2011  . Dyslipidemia 03/23/2011   21.5% 10-year risk of heart disease or stroke.     [name]. Hypertension    Review of Systems:   Review of Systems  Constitutional: Negative for chills, fever, malaise/fatigue and weight loss.  Respiratory: Negative for cough and shortness of breath.   Cardiovascular: Negative for chest pain, palpitations and leg swelling.  Gastrointestinal: Negative for abdominal pain, blood in stool, constipation, diarrhea, melena, nausea and vomiting.  Genitourinary: Negative for dysuria, flank pain and hematuria.  Musculoskeletal: Negative for myalgias.  Neurological: Negative for dizziness and headaches.    Physical Exam:  Vitals:   06/13/17 0856  BP: (!) 151/80  Pulse: 78  Temp: (!) 97.5 F (36.4 C)  TempSrc: Oral  SpO2: 100%  Weight: 180 lb (81.6 kg)  Height: 5\' 6"  (1.676 m)    General: very pleasant male, well-nourished, well-developed, in no acute distress  Cardiac: regular rate and rhythm, nl S1/S2, no murmurs, rubs or gallops  Pulm: CTAB, no wheezes or crackles, no increased work of breathing  Abd: soft, NTND, bowel sounds present Neuro: A&Ox3, no focal deficits noted   Ext: warm and well perfused, no peripheral edema noted. Small wound on R calf healing well. There is mild erythema around the lesion. No warmth, induration, fluctuance or discharge noted.     Assessment & Plan:   See Encounters Tab for problem based charting.  Patient discussed with Dr. Oswaldo DoneVincent

## 2017-06-13 NOTE — Assessment & Plan Note (Addendum)
Patient presents for HTN follow-up. His blood pressure has been consistently above goal at every visit for the past year. He has declined antihypertensive medications in the past numerous times despite extensive discussion of potential risks from long-term uncontrolled hypertension. He measures his BP at home and reports it is usually 135-140/70s. BP 155/80 today. It is possible patient is experiencing white coat hypertension. I have asked him to bring his blood pressure monitor and cuff in 4 weeks to check his BP readings from home and ensure he is using an adequate cuff.   - Follow up in 4 weeks. Asked patient to bring home blood pressure monitor and cuff

## 2017-06-14 ENCOUNTER — Telehealth: Payer: Self-pay

## 2017-06-14 NOTE — Telephone Encounter (Signed)
Requesting antibiotic to sent to the pharmcy. Please call back.

## 2017-06-14 NOTE — Progress Notes (Signed)
Internal Medicine Clinic Attending  Case discussed with Dr. Santos-Sanchez at the time of the visit.  We reviewed the resident's history and exam and pertinent patient test results.  I agree with the assessment, diagnosis, and plan of care documented in the resident's note.    

## 2017-06-15 NOTE — Telephone Encounter (Signed)
I did not prescribe antibiotics to this patient. Did he say why he wanted antibiotics?

## 2017-06-15 NOTE — Telephone Encounter (Signed)
Patient is calling back regarding medicine °

## 2017-06-16 ENCOUNTER — Encounter: Payer: Self-pay | Admitting: Internal Medicine

## 2017-06-19 ENCOUNTER — Ambulatory Visit (INDEPENDENT_AMBULATORY_CARE_PROVIDER_SITE_OTHER): Payer: BLUE CROSS/BLUE SHIELD | Admitting: Internal Medicine

## 2017-06-19 ENCOUNTER — Encounter: Payer: Self-pay | Admitting: Internal Medicine

## 2017-06-19 ENCOUNTER — Telehealth: Payer: Self-pay | Admitting: *Deleted

## 2017-06-19 VITALS — BP 169/76 | HR 73 | Temp 97.4°F | Ht 66.0 in | Wt 180.2 lb

## 2017-06-19 DIAGNOSIS — S80821A Blister (nonthermal), right lower leg, initial encounter: Secondary | ICD-10-CM | POA: Diagnosis not present

## 2017-06-19 DIAGNOSIS — X58XXXA Exposure to other specified factors, initial encounter: Secondary | ICD-10-CM

## 2017-06-19 MED ORDER — CEPHALEXIN 500 MG PO CAPS: 500.0000 mg | ORAL_CAPSULE | Freq: Four times a day (QID) | ORAL | 0 refills | Status: AC

## 2017-06-19 NOTE — Telephone Encounter (Signed)
Pt came by the clinic - stated he has another blister, he would like doctor to look at and maybe prescribe abx's. He saw Dr Lovenia KimSantos-Sanchez last week. Stated the other blister has healed. [facility] @ 1315PM.

## 2017-06-19 NOTE — Patient Instructions (Addendum)
Gregory Sosa,   I sent a prescription for Keflex to your pharmacy. You will take 1 tablet 4 times a day for 7 days.   If you develop fever, chills, or notice your blisters are not improving return to clinic for further evaluation.    Impetigo, Adult Impetigo is an infection of the skin. It commonly occurs in young children, but it can also occur in adults. The infection causes itchy blisters and sores that produce brownish-yellow fluid. As the fluid dries, it forms a thick, honey-colored crust. These skin changes usually occur on the face but can also affect other areas of the body. Impetigo usually goes away in 7-10 days with treatment. What are the causes? Impetigo is caused by two types of bacteria. It may be caused by staphylococci or streptococci bacteria. These bacteria cause impetigo when they get under the surface of the skin. This often happens after some damage to the skin, such as damage from:  Cuts, scrapes, or scratches.  Insect bites, especially when you scratch the area of a bite.  Chickenpox or other illnesses that cause open skin sores.  Nail biting or chewing.  Impetigo is contagious and can spread easily from one person to another. This may occur through close skin contact or by sharing towels, clothing, or other items with a person who has the infection. What increases the risk? Some things that can increase the risk of getting this infection include:  Playing sports that include skin-to-skin contact with others.  Having a skin condition with open sores.  Having many skin cuts or scrapes.  Living in an area that has high humidity levels.  Having poor hygiene.  Having high levels of staphylococci in your nose.  What are the signs or symptoms? Impetigo usually starts out as small blisters, often on the face. The blisters then break open and turn into tiny sores (lesions) with a yellow crust. In some cases, the blisters cause itching or burning. With scratching,  irritation, or lack of treatment, these small lesions may get larger. Scratching can also cause impetigo to spread to other parts of the body. The bacteria can get under the fingernails and spread when you touch another area of your skin. Other possible symptoms include:  Larger blisters.  Pus.  Swollen lymph glands.  How is this diagnosed? This condition is usually diagnosed during a physical exam. A skin sample or sample of fluid from a blister may be taken for lab tests that involve growing bacteria (culture test). This can help confirm the diagnosis or help determine the best treatment. How is this treated? Mild impetigo can be treated with prescription antibiotic cream. Oral antibiotic medicine may be used in more severe cases. Medicines for itching may also be used. Follow these instructions at home:  Take medicines only as directed by your health care provider.  To help prevent impetigo from spreading to other body areas: ? Keep your fingernails short and clean. ? Do not scratch the blisters or sores. ? Cover infected areas, if necessary, to keep from scratching.  Gently wash the infected areas with antibiotic soap and water.  Soak crusted areas in warm, soapy water using antibiotic soap. ? Gently rub the areas to remove crusts. Do not scrub.  Wash your hands often to avoid spreading this infection.  Stay home until you have used an antibiotic cream for 48 hours (2 days) or an oral antibiotic medicine for 24 hours (1 day). You should only return to work and activities with  other people if your skin shows significant improvement. How is this prevented? To keep the infection from spreading:  Stay home until you have used an antibiotic cream for 48 hours or an oral antibiotic for 24 hours.  Wash your hands often.  Do not engage in skin-to-skin contact with other people while you have still have blisters.  Do not share towels, washcloths, or bedding with others while you  have the infection.  Contact a health care provider if:  You develop more blisters or sores despite treatment.  Other family members get sores.  Your skin sores are not improving after 48 hours of treatment.  You have a fever. Get help right away if:  You see spreading redness or swelling of the skin around your sores.  You see red streaks coming from your sores.  You develop a sore throat. This information is not intended to replace advice given to you by your health care provider. Make sure you discuss any questions you have with your health care provider. Document Released: 05/16/2014 Document Revised: 10/01/2015 Document Reviewed: 04/08/2014 Elsevier Interactive Patient Education  2017 [employer].

## 2017-06-19 NOTE — Assessment & Plan Note (Addendum)
Patient presents for RLE blister follow up. Patient presented to clinic 6 days ago with a healing blister in R calf. No intervention indicated at the time as lesion was healing and no signs/symptoms of infection. Today he presents with a fluid-filled vesicle on shin of RLE associated erythema. No discharge or warmth noted. He did noticed a clear discharge yesterday. He denies preceding trauma. No sick contacts. Denies fever and chills. DDx: impetigo, ecthyma, bullous disease of diabetes, etc. Will treat with antibiotic therapy at this time given surrounding erythema and recurrent blister.   - Keflex 500 mg QID x 7 days  - Patient advised to return to clinic if he experiences systemic symptoms or if lesions fail to improve after completion of antibiotic therapy

## 2017-06-19 NOTE — Progress Notes (Signed)
   CC: Blister follow up   HPI:  [name] is a 57 y.o. male with PMH listed below who presents to clinic for blister follow-up.  Please see problem based assessment and plan for further details.  Past Medical History:  Diagnosis Date  . Diabetes mellitus type 2, uncontrolled (HCC) 03/23/2011  . Dyslipidemia 03/23/2011   21.5% 10-year risk of heart disease or stroke.     [name]. Hypertension    Review of Systems:   Review of Systems  Constitutional: Negative for chills, fever and malaise/fatigue.  Cardiovascular: Positive for leg swelling.  Musculoskeletal: Negative for myalgias.  Skin: Negative for itching and rash.    Physical Exam:  Vitals:   06/19/17 1312  BP: (!) 169/76  Pulse: 73  Temp: (!) 97.4 F (36.3 C)  TempSrc: Oral  SpO2: 96%  Weight: 180 lb 3.2 oz (81.7 kg)  Height: 5\' 6"  (1.676 m)    General: pleasant male, well-nourished, well-developed, in no acute distress  Ext: warm and well perfused. There is mild pitting edema on RLE up to shin.  Derm: There is a 2x4 cm lesion on anterior shin with a fluid-filled vesicle and surrounding erythema. No warmth or discharge noted.  Previous lesion healing well and also associated with erythema (has not worsened since last visit). Pictures in media tab.    Assessment & Plan:   See Encounters Tab for problem based charting.  Patient seen with Dr. Heide SparkNarendra

## 2017-06-19 NOTE — Telephone Encounter (Signed)
Thank you :)

## 2017-06-21 NOTE — Progress Notes (Signed)
Internal Medicine Clinic Attending  I saw and evaluated the patient.  I personally confirmed the key portions of the history and exam documented by Dr. Santos-Sanchez and I reviewed pertinent patient test results.  The assessment, diagnosis, and plan were formulated together and I agree with the documentation in the resident's note. 

## 2017-06-29 ENCOUNTER — Other Ambulatory Visit: Payer: Self-pay | Admitting: Internal Medicine

## 2017-06-29 DIAGNOSIS — E1165 Type 2 diabetes mellitus with hyperglycemia: Principal | ICD-10-CM

## 2017-06-29 DIAGNOSIS — IMO0001 Reserved for inherently not codable concepts without codable children: Secondary | ICD-10-CM

## 2017-06-29 MED FILL — glipiZIDE ER 10 MG TB24: 10 | 30 days supply | Qty: 30 | Fill #0

## 2017-07-18 ENCOUNTER — Encounter: Payer: Self-pay | Admitting: Internal Medicine

## 2017-07-18 ENCOUNTER — Other Ambulatory Visit: Payer: Self-pay

## 2017-07-18 ENCOUNTER — Ambulatory Visit: Payer: BLUE CROSS/BLUE SHIELD | Admitting: Internal Medicine

## 2017-07-18 VITALS — BP 136/83 | HR 71 | Temp 97.5°F | Ht 66.0 in | Wt 178.4 lb

## 2017-07-18 DIAGNOSIS — L97919 Non-pressure chronic ulcer of unspecified part of right lower leg with unspecified severity: Secondary | ICD-10-CM

## 2017-07-18 DIAGNOSIS — S81801D Unspecified open wound, right lower leg, subsequent encounter: Secondary | ICD-10-CM

## 2017-07-18 MED ORDER — DOXYCYCLINE HYCLATE 100 MG PO TABS: 100.0000 mg | ORAL_TABLET | Freq: Two times a day (BID) | ORAL | 0 refills | Status: AC

## 2017-07-18 NOTE — Progress Notes (Signed)
   CC: Right leg wound.   HPI:  [name] is a 57 y.o. who presented to the clinic with continue erythema and discomfort around an anterior RLE ulcer. For a detailed assessment and plan please refer to problem based charting below.   Past Medical History:  Diagnosis Date  . Diabetes mellitus type 2, uncontrolled (HCC) 03/23/2011  . Dyslipidemia 03/23/2011   21.5% 10-year risk of heart disease or stroke.     [name]. Hypertension    Review of Systems:  Denies fevers, chills Denies fatigue, nausea/vomitng, abdominal pain  Physical Exam: Vitals:   07/18/17 1504  BP: 136/83  Pulse: 71  Temp: (!) 97.5 F (36.4 C)  TempSrc: Oral  SpO2: 98%  Weight: 178 lb 6.4 oz (80.9 kg)  Height: 5\' 6"  (1.676 m)   General: Well nourished male in no acute distress Pulm: Good air movement with no wheezing or crackles  CV: RRR, no murmurs, no rubs  Extremities: Pulses palpable in all extremities, no LE edema. Right anterior LE there is a 1 x 2 cm healing ulcer with surround erythema but no purulent discharge. Also a healing right posterior LE ulcer without erythema   Media Information   Document Information   Photos  Anterior RLE wound   07/18/2017 16:11  Attached To:  Gregory Sosa  Source Information   Anne Shutteraines, Alexander N, MD  [facility]   Assessment & Plan:   See Encounters Tab for problem based charting.  Patient discussed with Dr. Sandre Kittyaines

## 2017-07-18 NOTE — Patient Instructions (Signed)
Thank you for allowing us to provide your care. I have sent out another antibiotic. Please get it and take it as prescribed. If you wound continues to get worse please return to the clinic. In addition, if you begin to experience fevers/chills, nausea, fatigue, or not feeling well please let us know.

## 2017-07-18 NOTE — Assessment & Plan Note (Signed)
Patient was evaluated on 2/11 for a blister on the anterior right lower extremity. At the time it was difficult to determine causation. He did have surrounding erythema and clear discharge so he started on Keflex for seven days. He completed the course approximately one week ago and notes that at the time of completion the erythema and pain had significantly improved however the wound changed from a blister to more like an ulcer. Since completing the antibiotics the erythema and purulent discharge has returned. He denies trauma to the area, insect bite, recent travel, systemic signs of infection. He has never had any wounds like this before. He denies a history of IBD and related symptoms. Denies symptoms of claudication.   On physical exam the patient is resting comfortably and vital signs are within normal limits. On physical exam there is a 2 x 4 cm healing ulceration on the right anterior lower extremity with surrounding erythema. At this point there is no purulent discharge. There is tenderness to palpation around the area. The patient also has a healing ulcer on the right posterior lower extremity. Images can be seen in the media tab or within this note.  The etiology of the patient's wound are unclear at this time. However the improvement with antibiotics and recurrence after completing antibiotic course raises concern for underlying infection. ABIs were obtained that illustrated normal ratios on both the right and left inconsistent with PAD. Unfortunately we were not able to obtain an ultrasound during this visit. Patient was prescribed doxycycline 100 mg BID for seven days and will follow-up in one week. At that point if he continues to have purulent discharge and erythema we will need to assess for underlying abscess.  Plan: - Referral to wound care - Doxycycline 100 mg BID for 7 days  - ABIs no consistent with PAD - Ultrasound at next visit if the patient continues to have purulent discharge and  erythema

## 2017-07-20 NOTE — Progress Notes (Addendum)
Internal Medicine Clinic Attending  I saw and evaluated the patient.  I personally confirmed the key portions of the history and exam documented by Dr. Helberg and I reviewed pertinent patient test results.  The assessment, diagnosis, and plan were formulated together and I agree with the documentation in the resident's note.  Alexander N Raines, MD  

## 2017-07-25 ENCOUNTER — Encounter: Payer: Self-pay | Admitting: Internal Medicine

## 2017-07-25 ENCOUNTER — Ambulatory Visit (INDEPENDENT_AMBULATORY_CARE_PROVIDER_SITE_OTHER): Payer: BLUE CROSS/BLUE SHIELD | Admitting: Internal Medicine

## 2017-07-25 ENCOUNTER — Other Ambulatory Visit: Payer: Self-pay

## 2017-07-25 VITALS — BP 135/74 | HR 80 | Temp 97.5°F | Ht 66.0 in | Wt 177.6 lb

## 2017-07-25 DIAGNOSIS — L97919 Non-pressure chronic ulcer of unspecified part of right lower leg with unspecified severity: Secondary | ICD-10-CM | POA: Diagnosis not present

## 2017-07-25 DIAGNOSIS — S81801A Unspecified open wound, right lower leg, initial encounter: Secondary | ICD-10-CM

## 2017-07-25 DIAGNOSIS — E1165 Type 2 diabetes mellitus with hyperglycemia: Secondary | ICD-10-CM

## 2017-07-25 DIAGNOSIS — I10 Essential (primary) hypertension: Secondary | ICD-10-CM | POA: Diagnosis not present

## 2017-07-25 DIAGNOSIS — E119 Type 2 diabetes mellitus without complications: Secondary | ICD-10-CM

## 2017-07-25 DIAGNOSIS — Z7984 Long term (current) use of oral hypoglycemic drugs: Secondary | ICD-10-CM

## 2017-07-25 DIAGNOSIS — E11622 Type 2 diabetes mellitus with other skin ulcer: Secondary | ICD-10-CM

## 2017-07-25 LAB — POCT GLYCOSYLATED HEMOGLOBIN (HGB A1C): Hemoglobin A1C: 8.8

## 2017-07-25 LAB — GLUCOSE, CAPILLARY: Glucose-Capillary: 262 mg/dL — ABNORMAL HIGH (ref 65–99)

## 2017-07-25 MED ORDER — SITAGLIPTIN PHOSPHATE 100 MG PO TABS: 100.0000 mg | ORAL_TABLET | Freq: Every day | ORAL | 6 refills | Status: DC

## 2017-07-25 NOTE — Assessment & Plan Note (Signed)
His wound seems healing, no sign of worsening infection. He has his  appointment with wound clinic next week. ABI done last office visit was normal. He needs debridement with will be done at wound care clinic. He also needs better control of his diabetes to prevent more ulceration and help with proper wound healing.  He was advised to keep his appointment with wound care clinic. We had a long discussion regarding better control of his diabetes with checking his blood sugar 3-4 times daily, watching his diet and exercise regularly. He was asked to bring his glucometer with him during next follow-up visit so we can intervene properly. He was started on Januvia today. He will follow-up with his PCP within a month.

## 2017-07-25 NOTE — Assessment & Plan Note (Signed)
BP Readings from Last 3 Encounters:  07/25/17 135/74  07/18/17 136/83  06/19/17 (!) 169/76   He was having mildly elevated blood pressure at 135. Patient is trying to control his sodium intake and does not want any antihypertensives at this time.  He was provided with DASH diet information. Reevaluate during next follow-up visit.

## 2017-07-25 NOTE — Patient Instructions (Addendum)
Thank you for visiting clinic today. Your A1c was higher at 8.8 today. As we discussed it is really important to check your blood sugar multiple times a day at least 3-4 times and bring your glucometer with your next follow-up appointment with your PCP. High blood sugar can interfere with your wound healing and because your infection to spread. I am starting you on a new medication called Januvia, you will take it 1 tablet of 100 mg daily. Please follow DASH diet keep your blood pressure under good control, as you do not want any blood pressure medication at this time. Please follow-up at wound clinic according to your scheduled appointment. Follow-up with your PCP within a month.   DASH Eating Plan DASH stands for "Dietary Approaches to Stop Hypertension." The DASH eating plan is a healthy eating plan that has been shown to reduce high blood pressure (hypertension). It may also reduce your risk for type 2 diabetes, heart disease, and stroke. The DASH eating plan may also help with weight loss. What are tips for following this plan? General guidelines  Avoid eating more than 2,300 mg (milligrams) of salt (sodium) a day. If you have hypertension, you may need to reduce your sodium intake to 1,500 mg a day.  Limit alcohol intake to no more than 1 drink a day for nonpregnant women and 2 drinks a day for men. One drink equals 12 oz of beer, 5 oz of wine, or 1 oz of hard liquor.  Work with your health care provider to maintain a healthy body weight or to lose weight. Ask what an ideal weight is for you.  Get at least 30 minutes of exercise that causes your heart to beat faster (aerobic exercise) most days of the week. Activities may include walking, swimming, or biking.  Work with your health care provider or diet and nutrition specialist (dietitian) to adjust your eating plan to your individual calorie needs. Reading food labels  Check food labels for the amount of sodium per serving. Choose  foods with less than 5 percent of the Daily Value of sodium. Generally, foods with less than 300 mg of sodium per serving fit into this eating plan.  To find whole grains, look for the word "whole" as the first word in the ingredient list. Shopping  Buy products labeled as "low-sodium" or "no salt added."  Buy fresh foods. Avoid canned foods and premade or frozen meals. Cooking  Avoid adding salt when cooking. Use salt-free seasonings or herbs instead of table salt or sea salt. Check with your health care provider or pharmacist before using salt substitutes.  Do not fry foods. Cook foods using healthy methods such as baking, boiling, grilling, and broiling instead.  Cook with heart-healthy oils, such as olive, canola, soybean, or sunflower oil. Meal planning   Eat a balanced diet that includes: ? 5 or more servings of fruits and vegetables each day. At each meal, try to fill half of your plate with fruits and vegetables. ? Up to 6-8 servings of whole grains each day. ? Less than 6 oz of lean meat, poultry, or fish each day. A 3-oz serving of meat is about the same size as a deck of cards. One egg equals 1 oz. ? 2 servings of low-fat dairy each day. ? A serving of nuts, seeds, or beans 5 times each week. ? Heart-healthy fats. Healthy fats called Omega-3 fatty acids are found in foods such as flaxseeds and coldwater fish, like sardines, salmon, and mackerel.  Limit how much you eat of the following: ? Canned or prepackaged foods. ? Food that is high in trans fat, such as fried foods. ? Food that is high in saturated fat, such as fatty meat. ? Sweets, desserts, sugary drinks, and other foods with added sugar. ? Full-fat dairy products.  Do not salt foods before eating.  Try to eat at least 2 vegetarian meals each week.  Eat more home-cooked food and less restaurant, buffet, and fast food.  When eating at a restaurant, ask that your food be prepared with less salt or no salt, if  possible. What foods are recommended? The items listed may not be a complete list. Talk with your dietitian about what dietary choices are best for you. Grains Whole-grain or whole-wheat bread. Whole-grain or whole-wheat pasta. Brown rice. Modena Morrow. Bulgur. Whole-grain and low-sodium cereals. Pita bread. Low-fat, low-sodium crackers. Whole-wheat flour tortillas. Vegetables Fresh or frozen vegetables (raw, steamed, roasted, or grilled). Low-sodium or reduced-sodium tomato and vegetable juice. Low-sodium or reduced-sodium tomato sauce and tomato paste. Low-sodium or reduced-sodium canned vegetables. Fruits All fresh, dried, or frozen fruit. Canned fruit in natural juice (without added sugar). Meat and other protein foods Skinless chicken or [place]. Ground chicken or [place]. Pork with fat trimmed off. Fish and seafood. Egg whites. Dried beans, peas, or lentils. Unsalted nuts, nut butters, and seeds. Unsalted canned beans. Lean cuts of beef with fat trimmed off. Low-sodium, lean deli meat. Dairy Low-fat (1%) or fat-free (skim) milk. Fat-free, low-fat, or reduced-fat cheeses. Nonfat, low-sodium ricotta or cottage cheese. Low-fat or nonfat yogurt. Low-fat, low-sodium cheese. Fats and oils Soft margarine without trans fats. Vegetable oil. Low-fat, reduced-fat, or light mayonnaise and salad dressings (reduced-sodium). Canola, safflower, olive, soybean, and sunflower oils. Avocado. Seasoning and other foods Herbs. Spices. Seasoning mixes without salt. Unsalted popcorn and pretzels. Fat-free sweets. What foods are not recommended? The items listed may not be a complete list. Talk with your dietitian about what dietary choices are best for you. Grains Baked goods made with fat, such as croissants, muffins, or some breads. Dry pasta or rice meal packs. Vegetables Creamed or fried vegetables. Vegetables in a cheese sauce. Regular canned vegetables (not low-sodium or reduced-sodium). Regular canned  tomato sauce and paste (not low-sodium or reduced-sodium). Regular tomato and vegetable juice (not low-sodium or reduced-sodium). Angie Fava. Olives. Fruits Canned fruit in a light or heavy syrup. Fried fruit. Fruit in cream or butter sauce. Meat and other protein foods Fatty cuts of meat. Ribs. Fried meat. Berniece Salines. Sausage. Bologna and other processed lunch meats. Salami. Fatback. Hotdogs. Bratwurst. Salted nuts and seeds. Canned beans with added salt. Canned or smoked fish. Whole eggs or egg yolks. Chicken or [place] with skin. Dairy Whole or 2% milk, cream, and half-and-half. Whole or full-fat cream cheese. Whole-fat or sweetened yogurt. Full-fat cheese. Nondairy creamers. Whipped toppings. Processed cheese and cheese spreads. Fats and oils Butter. Stick margarine. Lard. Shortening. Ghee. Bacon fat. Tropical oils, such as coconut, palm kernel, or palm oil. Seasoning and other foods Salted popcorn and pretzels. Onion salt, garlic salt, seasoned salt, table salt, and sea salt. Worcestershire sauce. Tartar sauce. Barbecue sauce. Teriyaki sauce. Soy sauce, including reduced-sodium. Steak sauce. Canned and packaged gravies. Fish sauce. Oyster sauce. Cocktail sauce. Horseradish that you find on the shelf. Ketchup. Mustard. Meat flavorings and tenderizers. Bouillon cubes. Hot sauce and Tabasco sauce. Premade or packaged marinades. Premade or packaged taco seasonings. Relishes. Regular salad dressings. Where to find more information:  National Heart, Lung, and Blood  Institute: PopSteam.iswww.nhlbi.nih.gov  American Heart Association: www.heart.org Summary  The DASH eating plan is a healthy eating plan that has been shown to reduce high blood pressure (hypertension). It may also reduce your risk for type 2 diabetes, heart disease, and stroke.  With the DASH eating plan, you should limit salt (sodium) intake to 2,300 mg a day. If you have hypertension, you may need to reduce your sodium intake to 1,500 mg a day.  When  on the DASH eating plan, aim to eat more fresh fruits and vegetables, whole grains, lean proteins, low-fat dairy, and heart-healthy fats.  Work with your health care provider or diet and nutrition specialist (dietitian) to adjust your eating plan to your individual calorie needs. This information is not intended to replace advice given to you by your health care provider. Make sure you discuss any questions you have with your health care provider. Document Released: 04/14/2011 Document Revised: 04/18/2016 Document Reviewed: 04/18/2016 Elsevier Interactive Patient Education  2018 [employer] Sitagliptin oral tablet What is this medicine? SITAGLIPTIN (sit a GLIP tin) helps to treat type 2 diabetes. It helps to control blood sugar. Treatment is combined with diet and exercise. This medicine may be used for other purposes; ask your health care provider or pharmacist if you have questions. COMMON BRAND NAME(S): Januvia What should I tell my health care provider before I take this medicine? They need to know if you have any of these conditions: -diabetic ketoacidosis -kidney disease -pancreatitis -previous swelling of the tongue, face, or lips with difficulty breathing, difficulty swallowing, hoarseness, or tightening of the throat -type 1 diabetes -an unusual or allergic reaction to sitagliptin, other medicines, foods, dyes, or preservatives -pregnant or trying to get pregnant -breast-feeding How should I use this medicine? Take this medicine by mouth with a glass of water. Follow the directions on the prescription label. You can take it with or without food. Do not cut, crush or chew this medicine. Take your dose at the same time each day. Do not take more often than directed. Do not stop taking except on your doctor's advice. A special MedGuide will be given to you by the pharmacist with each prescription and refill. Be sure to read this information carefully each time. Talk to your  pediatrician regarding the use of this medicine in children. Special care may be needed. Overdosage: If you think you have taken too much of this medicine contact a poison control center or emergency room at once. NOTE: This medicine is only for you. Do not share this medicine with others. What if I miss a dose? If you miss a dose, take it as soon as you can. If it is almost time for your next dose, take only that dose. Do not take double or extra doses. What may interact with this medicine? Do not take this medicine with any of the following medications: -gatifloxacin This medicine may also interact with the following medications: -alcohol -digoxin -insulin -sulfonylureas like glimepiride, glipizide, glyburide This list may not describe all possible interactions. Give your health care provider a list of all the medicines, herbs, non-prescription drugs, or dietary supplements you use. Also tell them if you smoke, drink alcohol, or use illegal drugs. Some items may interact with your medicine. What should I watch for while using this medicine? Visit your doctor or health care professional for regular checks on your progress. A test called the HbA1C (A1C) will be monitored. This is a simple blood test. It measures your blood sugar control  over the last 2 to 3 months. You will receive this test every 3 to 6 months. Learn how to check your blood sugar. Learn the symptoms of low and high blood sugar and how to manage them. Always carry a quick-source of sugar with you in case you have symptoms of low blood sugar. Examples include hard sugar candy or glucose tablets. Make sure others know that you can choke if you eat or drink when you develop serious symptoms of low blood sugar, such as seizures or unconsciousness. They must get medical help at once. Tell your doctor or health care professional if you have high blood sugar. You might need to change the dose of your medicine. If you are sick or  exercising more than usual, you might need to change the dose of your medicine. Do not skip meals. Ask your doctor or health care professional if you should avoid alcohol. Many nonprescription cough and cold products contain sugar or alcohol. These can affect blood sugar. Wear a medical ID bracelet or chain, and carry a card that describes your disease and details of your medicine and dosage times. What side effects may I notice from receiving this medicine? Side effects that you should report to your doctor or health care professional as soon as possible: -allergic reactions like skin rash, itching or hives, swelling of the face, lips, or tongue -breathing problems -general ill feeling or flu-like symptoms -joint pain -loss of appetite -redness, blistering, peeling or loosening of the skin, including inside the mouth -signs and symptoms of low blood sugar such as feeling anxious, confusion, dizziness, increased hunger, unusually weak or tired, sweating, shakiness, cold, irritable, headache, blurred vision, fast heartbeat, loss of consciousness -unusual stomach pain or discomfort -vomiting Side effects that usually do not require medical attention (report to your doctor or health care professional if they continue or are bothersome): -diarrhea -headache -sore throat -stomach upset -stuffy or runny nose This list may not describe all possible side effects. Call your doctor for medical advice about side effects. You may report side effects to FDA at 1-800-FDA-1088. Where should I keep my medicine? Keep out of the reach of children. Store at room temperature between 15 and 30 degrees C (59 and 86 degrees F). Throw away any unused medicine after the expiration date. NOTE: This sheet is a summary. It may not cover all possible information. If you have questions about this medicine, talk to your doctor, pharmacist, or health care provider.  2018 Elsevier/Gold Standard (2015-10-23 14:23:55)

## 2017-07-25 NOTE — Progress Notes (Signed)
   CC: For follow-up of his right lower extremity ulcer.  HPI:  Gregory Sosa is a 57 y.o. past medical history as listed below came to the clinic for follow-up of his  anterior right lower extremity ulcer.  Patient was initially treated with Keflex for 7 days, his purulent discharge got worse once he stopped his antibiotic, during previous office visit he was given doxycycline, which he is taking now.  According to patient his purulent discharge has been improved, pain and erythema is improving.  He has his appointment with wound care next week. He was also having a small ulcer on posterior side of right leg which also seems improving.  See assessment and plan for his chronic condition.  Past Medical History:  Diagnosis Date  . Diabetes mellitus type 2, uncontrolled (HCC) 03/23/2011  . Dyslipidemia 03/23/2011   21.5% 10-year risk of heart disease or stroke.     [name]. Hypertension    Review of Systems: Negative except mentioned in HPI.  Physical Exam:  Vitals:   07/25/17 1436  BP: 135/74  Pulse: 80  Temp: (!) 97.5 F (36.4 C)  TempSrc: Oral  SpO2: 98%  Weight: 177 lb 9.6 oz (80.6 kg)  Height: 5\' 6"  (1.676 m)        General: Vital signs reviewed.  Patient is well-developed and well-nourished, in no acute distress and cooperative with exam.  Head: Normocephalic and atraumatic. Eyes: EOMI, conjunctivae normal, no scleral icterus.  Cardiovascular: RRR, S1 normal, S2 normal, no murmurs, gallops, or rubs. Pulmonary/Chest: Clear to auscultation bilaterally, no wheezes, rales, or rhonchi. Abdominal: Soft, non-tender, non-distended, BS +, no masses, organomegaly, or guarding present.  Extremities: No lower extremity edema bilaterally,  pulses symmetric and intact bilaterally.  There is a 2 x 3 cm healing ulcer with some eschar and erythematous margins on anterior leg no purulent discharge, there was another 1 x 1 cm small ulcer with mildly erythematous margins with no discharge on  posterior upper leg. Psychiatric: Normal mood and affect. speech and behavior is normal. Cognition and memory are normal.  Assessment & Plan:   See Encounters Tab for problem based charting.  Patient discussed with Dr. Rogelia BogaButcher.

## 2017-07-25 NOTE — Assessment & Plan Note (Signed)
A1c done today was 8.8, increased from his previous A1c of 7.8 which was done in October 2018.  He is just on glipizide 10 mg daily, unable to tolerate metformin because of GI side effects. He normally checked his blood sugar once every other day and it remained between 150-200 according to patient, never brought his glucometer with him. He needs better control of his diabetes especially with lower extremity wound.  He was started on Januvia 100 mg daily. He was advised to continue glipizide 10 mg daily. He was advised to check his blood sugar at least 3-4 times daily and bring his glucometer with him for his next follow-up appointment with PCP for further evaluation and management.

## 2017-07-26 NOTE — Progress Notes (Signed)
Internal Medicine Clinic Attending  Case discussed with Dr. Nelson ChimesAmin at the time of the visit.  We reviewed the resident's history and exam and pertinent patient test results.  I agree with the assessment, diagnosis, and plan of care.

## 2017-07-31 ENCOUNTER — Encounter (HOSPITAL_BASED_OUTPATIENT_CLINIC_OR_DEPARTMENT_OTHER): Payer: BLUE CROSS/BLUE SHIELD | Attending: Internal Medicine

## 2017-07-31 DIAGNOSIS — I10 Essential (primary) hypertension: Secondary | ICD-10-CM | POA: Diagnosis not present

## 2017-07-31 DIAGNOSIS — L97818 Non-pressure chronic ulcer of other part of right lower leg with other specified severity: Secondary | ICD-10-CM | POA: Diagnosis not present

## 2017-07-31 DIAGNOSIS — Z87891 Personal history of nicotine dependence: Secondary | ICD-10-CM | POA: Diagnosis not present

## 2017-07-31 DIAGNOSIS — E11622 Type 2 diabetes mellitus with other skin ulcer: Secondary | ICD-10-CM | POA: Insufficient documentation

## 2017-07-31 DIAGNOSIS — Z7984 Long term (current) use of oral hypoglycemic drugs: Secondary | ICD-10-CM | POA: Insufficient documentation

## 2017-08-01 MED FILL — glipiZIDE ER 10 MG TB24: 10 | 30 days supply | Qty: 30 | Fill #1

## 2017-08-08 ENCOUNTER — Encounter (HOSPITAL_BASED_OUTPATIENT_CLINIC_OR_DEPARTMENT_OTHER): Payer: BLUE CROSS/BLUE SHIELD | Attending: Internal Medicine

## 2017-08-08 DIAGNOSIS — I10 Essential (primary) hypertension: Secondary | ICD-10-CM | POA: Diagnosis not present

## 2017-08-08 DIAGNOSIS — Z7984 Long term (current) use of oral hypoglycemic drugs: Secondary | ICD-10-CM | POA: Insufficient documentation

## 2017-08-08 DIAGNOSIS — L97212 Non-pressure chronic ulcer of right calf with fat layer exposed: Secondary | ICD-10-CM | POA: Diagnosis not present

## 2017-08-08 DIAGNOSIS — E11622 Type 2 diabetes mellitus with other skin ulcer: Secondary | ICD-10-CM | POA: Insufficient documentation

## 2017-08-15 DIAGNOSIS — E11622 Type 2 diabetes mellitus with other skin ulcer: Secondary | ICD-10-CM | POA: Diagnosis not present

## 2017-08-22 DIAGNOSIS — E11622 Type 2 diabetes mellitus with other skin ulcer: Secondary | ICD-10-CM | POA: Diagnosis not present

## 2017-08-29 DIAGNOSIS — E11622 Type 2 diabetes mellitus with other skin ulcer: Secondary | ICD-10-CM | POA: Diagnosis not present

## 2017-09-04 MED FILL — glipiZIDE ER 10 MG TB24: 10 | 30 days supply | Qty: 30 | Fill #2

## 2017-10-04 MED FILL — glipiZIDE ER 10 MG TB24: 10 | 30 days supply | Qty: 30 | Fill #3

## 2017-11-01 ENCOUNTER — Other Ambulatory Visit: Payer: Self-pay | Admitting: Oncology

## 2017-11-01 DIAGNOSIS — E1165 Type 2 diabetes mellitus with hyperglycemia: Principal | ICD-10-CM

## 2017-11-01 DIAGNOSIS — IMO0001 Reserved for inherently not codable concepts without codable children: Secondary | ICD-10-CM

## 2017-11-01 MED FILL — glipiZIDE ER 10 MG TB24: 10 | 90 days supply | Qty: 90 | Fill #0

## 2017-12-01 ENCOUNTER — Encounter: Payer: Self-pay | Admitting: Internal Medicine

## 2017-12-01 ENCOUNTER — Ambulatory Visit (INDEPENDENT_AMBULATORY_CARE_PROVIDER_SITE_OTHER): Payer: BLUE CROSS/BLUE SHIELD | Admitting: Internal Medicine

## 2017-12-01 ENCOUNTER — Other Ambulatory Visit: Payer: Self-pay

## 2017-12-01 VITALS — BP 145/82 | HR 77 | Temp 97.6°F | Ht 66.0 in | Wt 173.7 lb

## 2017-12-01 DIAGNOSIS — E1165 Type 2 diabetes mellitus with hyperglycemia: Secondary | ICD-10-CM

## 2017-12-01 DIAGNOSIS — E118 Type 2 diabetes mellitus with unspecified complications: Secondary | ICD-10-CM

## 2017-12-01 DIAGNOSIS — I10 Essential (primary) hypertension: Secondary | ICD-10-CM

## 2017-12-01 DIAGNOSIS — Z7984 Long term (current) use of oral hypoglycemic drugs: Secondary | ICD-10-CM

## 2017-12-01 LAB — POCT GLYCOSYLATED HEMOGLOBIN (HGB A1C): Hemoglobin A1C: 8.9 % — AB (ref 4.0–5.6)

## 2017-12-01 LAB — GLUCOSE, CAPILLARY
Glucose-Capillary: 207 mg/dL — ABNORMAL HIGH (ref 70–99)
Glucose-Capillary: 215 mg/dL — ABNORMAL HIGH (ref 70–99)

## 2017-12-01 MED ORDER — SITAGLIPTIN PHOSPHATE 100 MG PO TABS: 100.0000 mg | ORAL_TABLET | Freq: Every day | ORAL | 1 refills | Status: DC

## 2017-12-01 NOTE — Patient Instructions (Signed)
[name],   Please continue taking your glipizide as usual. I have sent a new prescription for Januvia to the [facility].  Please take 1 tablet every day.  Please schedule a follow-up appointment with me in 3 months to check up on your diabetes again.  Please make sure to bring your blood pressure cuff and monitor to this appointment.  Please call us if you have any questions or concerns.  - Dr. Evelene CroonSantos

## 2017-12-01 NOTE — Progress Notes (Signed)
   CC: T2DM and HTN follow up   HPI:  Gregory Sosa is a 57 y.o. male with PMH listed below who presents to clinic for follow up of type 2 DM and HTN.   T2DM, uncontrolled: Patient takes glipizide 10 mg QD and compliant with it. He was last seen on 3/19 at which time Januvia was added to his regimen due to A1c trending up 7.8-> 8.8. He reports taking Januvia for 3-4 days only. He checks his BG every night and did not see a decrease in BG and stopped the medication. Explained to patient that oral agents help with glycemic control in the long term and recommended starting a different agent such as an SGLT2-inhibitor. Patient stated he would like to try Januvia first since he already has the prescription at home. Will start Januvia 100 mg QD and reassess in 3 months. If A1c not improving will switch to SGLT2-inhibitor. Patient is agreeable to this. Declined eye exam and lipid panel. Will re-address at follow up visit in 3 months.   HTN, uncontrolled: Losartan in his med list but patient has never taken this medication. States he continues to adhere to a low-sodium diet to control his blood pressure.  I had a long discussion with patient about his unsuccessful attempt to control his BP despite adhering to a low sodium diet for the past years. Also discussed with him long-term complications of uncontrolled hypertension as well as uncontrolled diabetes and stressed importance of treatment to prevent these.  However, patient continues to decline starting a antihypertensive medication as his BP runs 130-140 at home.  I have asked him to bring his blood pressure cuff and monitor to his next appointment in 3 months.  Renal function normal. Will continue to counsel in future visits.   HM: Declined GI referral for colonoscopy.    Past Medical History:  Diagnosis Date  . Diabetes mellitus type 2, uncontrolled (HCC) 03/23/2011  . Dyslipidemia 03/23/2011   21.5% 10-year risk of heart disease or stroke.     [name].  Hypertension    Review of Systems:   Review of Systems  Constitutional: Negative for chills and fever.  Respiratory: Negative for wheezing.   Cardiovascular: Negative for chest pain, palpitations and leg swelling.  Gastrointestinal: Negative for abdominal pain, nausea and vomiting.  Genitourinary: Negative for frequency and urgency.  Neurological: Negative for dizziness and headaches.    Physical Exam:  Vitals:   12/01/17 1557 12/01/17 1626 12/01/17 1627  BP: (!) 150/74 (!) 159/81 (!) 145/82  Pulse: 75 74 77  Temp: 97.6 F (36.4 C)    TempSrc: Oral    SpO2: 99%    Weight: 173 lb 11.2 oz (78.8 kg)    Height: 5\' 6"  (1.676 m)     Physical Exam  Constitutional: He is oriented to person, place, and time and well-developed, well-nourished, and in no distress.  Cardiovascular: Normal rate, regular rhythm and normal heart sounds. Exam reveals no gallop and no friction rub.  No murmur heard. Pulmonary/Chest: Effort normal and breath sounds normal. No respiratory distress. He has no wheezes. He has no rales.  Neurological: He is alert and oriented to person, place, and time.    Assessment & Plan:   See Encounters Tab for problem based charting.  Patient discussed with Dr. Oswaldo DoneVincent

## 2017-12-02 ENCOUNTER — Encounter: Payer: Self-pay | Admitting: Internal Medicine

## 2017-12-02 NOTE — Assessment & Plan Note (Signed)
T2DM, uncontrolled: Patient takes glipizide 10 mg QD and compliant with it. He was last seen on 3/19 at which time Januvia was added to his regimen due to A1c trending up 7.8-> 8.8. He reports taking Januvia for 3-4 days only. He checks his BG every night and did not see a decrease in BG and stopped the medication. Explained to patient that oral agents help with glycemic control in the long term and recommended starting a different agent such as an SGLT2-inhibitor. Patient stated he would like to try Januvia first since he already has the prescription at home. Will start Januvia 100 mg QD and reassess in 3 months. If A1c not improving will switch to SGLT2-inhibitor. Patient is agreeable to this. Declined eye exam and lipid panel. Will re-address at follow up visit in 3 months.

## 2017-12-02 NOTE — Assessment & Plan Note (Signed)
HTN, uncontrolled: Losartan in his med list but patient has never taken this medication. States he continues to adhere to a low-sodium diet to control his blood pressure.  I had a long discussion with patient about his unsuccessful attempt to control his BP despite adhering to a low sodium diet for the past years. Also discussed with him long-term complications of uncontrolled hypertension as well as uncontrolled diabetes and stressed importance of treatment to prevent these.  However, patient continues to decline starting a antihypertensive medication as his BP runs 130-140 at home.  I have asked him to bring his blood pressure cuff and monitor to his next appointment in 3 months.  Renal function normal. Will continue to counsel in future visits.

## 2017-12-04 NOTE — Progress Notes (Signed)
Internal Medicine Clinic Attending  Case discussed with Dr. Santos-Sanchez at the time of the visit.  We reviewed the resident's history and exam and pertinent patient test results.  I agree with the assessment, diagnosis, and plan of care documented in the resident's note.    

## 2017-12-14 MED FILL — JANUVIA 100 MG TABLET: 100 | 30 days supply | Qty: 30 | Fill #0

## 2018-01-29 MED FILL — glipiZIDE ER 10 MG TB24: 10 | 90 days supply | Qty: 90 | Fill #1

## 2018-02-21 MED FILL — JANUVIA 100 MG TABLET: 100 | 30 days supply | Qty: 30 | Fill #1

## 2018-03-27 LAB — HM DIABETES EYE EXAM

## 2018-03-29 ENCOUNTER — Encounter: Payer: Self-pay | Admitting: *Deleted

## 2018-04-16 ENCOUNTER — Other Ambulatory Visit: Payer: Self-pay | Admitting: Internal Medicine

## 2018-04-16 DIAGNOSIS — E1165 Type 2 diabetes mellitus with hyperglycemia: Secondary | ICD-10-CM

## 2018-04-17 NOTE — Telephone Encounter (Signed)
Appt sch for 05/18/2017.

## 2018-04-19 MED FILL — glipiZIDE ER 10 MG TB24: 10 | 90 days supply | Qty: 90 | Fill #2

## 2018-04-19 MED FILL — JANUVIA 100 MG TABLET: 100 | 30 days supply | Qty: 30 | Fill #0

## 2018-05-18 ENCOUNTER — Encounter: Payer: Self-pay | Admitting: Internal Medicine

## 2018-05-25 ENCOUNTER — Other Ambulatory Visit: Payer: Self-pay | Admitting: Internal Medicine

## 2018-05-25 DIAGNOSIS — E1165 Type 2 diabetes mellitus with hyperglycemia: Secondary | ICD-10-CM

## 2018-05-25 MED FILL — JANUVIA 100 MG TABLET: 100 | 30 days supply | Qty: 30 | Fill #0

## 2018-06-29 ENCOUNTER — Encounter: Payer: Self-pay | Admitting: Internal Medicine

## 2018-06-29 ENCOUNTER — Ambulatory Visit (INDEPENDENT_AMBULATORY_CARE_PROVIDER_SITE_OTHER): Payer: 59 | Admitting: Internal Medicine

## 2018-06-29 ENCOUNTER — Other Ambulatory Visit: Payer: Self-pay

## 2018-06-29 VITALS — BP 169/86 | HR 86 | Temp 97.7°F | Wt 175.3 lb

## 2018-06-29 DIAGNOSIS — Z79899 Other long term (current) drug therapy: Secondary | ICD-10-CM | POA: Diagnosis not present

## 2018-06-29 DIAGNOSIS — E1165 Type 2 diabetes mellitus with hyperglycemia: Secondary | ICD-10-CM | POA: Diagnosis not present

## 2018-06-29 DIAGNOSIS — E118 Type 2 diabetes mellitus with unspecified complications: Secondary | ICD-10-CM

## 2018-06-29 DIAGNOSIS — Z9114 Patient's other noncompliance with medication regimen: Secondary | ICD-10-CM

## 2018-06-29 DIAGNOSIS — Z7984 Long term (current) use of oral hypoglycemic drugs: Secondary | ICD-10-CM

## 2018-06-29 DIAGNOSIS — I10 Essential (primary) hypertension: Secondary | ICD-10-CM

## 2018-06-29 DIAGNOSIS — Z0000 Encounter for general adult medical examination without abnormal findings: Secondary | ICD-10-CM | POA: Diagnosis not present

## 2018-06-29 LAB — GLUCOSE, CAPILLARY: Glucose-Capillary: 202 mg/dL — ABNORMAL HIGH (ref 70–99)

## 2018-06-29 LAB — POCT GLYCOSYLATED HEMOGLOBIN (HGB A1C): Hemoglobin A1C: 9 % — AB (ref 4.0–5.6)

## 2018-06-29 MED ORDER — CANAGLIFLOZIN 100 MG PO TABS: 100.0000 mg | ORAL_TABLET | Freq: Every day | ORAL | 1 refills | Status: DC

## 2018-06-29 MED ORDER — LOSARTAN POTASSIUM 25 MG PO TABS: 25.0000 mg | ORAL_TABLET | Freq: Every day | ORAL | 1 refills | Status: DC

## 2018-06-29 NOTE — Patient Instructions (Signed)
Gregory Sosa,   For your diabetes please start taking 1 tablet of Invokana (100 mg) once a day for breakfast.  Please continue taking your glipizide as usual.  It is very important that you take these medications every day. STOP Januvia.   For your blood pressure we will start a medication called losartan.  You will take 1 tablet (25 mg) every day.  We will call you with the results of your blood tests.  Make a follow-up appointment with me in 3 months to check up on your diabetes and high blood pressure.  In the meantime call us if you have any questions or concerns.  -Dr. Evelene Croon

## 2018-06-30 LAB — CBC WITH DIFFERENTIAL/PLATELET
Basophils Absolute: 0 10*3/uL (ref 0.0–0.2)
Basos: 0 %
EOS (ABSOLUTE): 0 10*3/uL (ref 0.0–0.4)
Eos: 1 %
Hematocrit: 43.9 % (ref 37.5–51.0)
Hemoglobin: 15.4 g/dL (ref 13.0–17.7)
Immature Grans (Abs): 0 10*3/uL (ref 0.0–0.1)
Immature Granulocytes: 0 %
Lymphocytes Absolute: 1.2 10*3/uL (ref 0.7–3.1)
Lymphs: 29 %
MCH: 30.4 pg (ref 26.6–33.0)
MCHC: 35.1 g/dL (ref 31.5–35.7)
MCV: 87 fL (ref 79–97)
Monocytes Absolute: 0.3 10*3/uL (ref 0.1–0.9)
Monocytes: 7 %
Neutrophils Absolute: 2.6 10*3/uL (ref 1.4–7.0)
Neutrophils: 63 %
Platelets: 215 10*3/uL (ref 150–450)
RBC: 5.06 x10E6/uL (ref 4.14–5.80)
RDW: 12.8 % (ref 11.6–15.4)
WBC: 4.2 10*3/uL (ref 3.4–10.8)

## 2018-06-30 LAB — LIPID PANEL
Chol/HDL Ratio: 6 ratio — ABNORMAL HIGH (ref 0.0–5.0)
Cholesterol, Total: 191 mg/dL (ref 100–199)
HDL: 32 mg/dL — ABNORMAL LOW (ref >39)
LDL Calculated: 109 mg/dL — ABNORMAL HIGH (ref 0–99)
Triglycerides: 249 mg/dL — ABNORMAL HIGH (ref 0–149)
VLDL Cholesterol Cal: 50 mg/dL — ABNORMAL HIGH (ref 5–40)

## 2018-06-30 LAB — BMP8+ANION GAP
Anion Gap: 13 mmol/L (ref 10.0–18.0)
BUN/Creatinine Ratio: 16 (ref 9–20)
BUN: 13 mg/dL (ref 6–24)
CO2: 23 mmol/L (ref 20–29)
Calcium: 10.6 mg/dL — ABNORMAL HIGH (ref 8.7–10.2)
Chloride: 97 mmol/L (ref 96–106)
Creatinine, Ser: 0.81 mg/dL (ref 0.76–1.27)
GFR calc Af Amer: 114 mL/min/{1.73_m2} (ref >59)
GFR calc non Af Amer: 99 mL/min/{1.73_m2} (ref >59)
Glucose: 220 mg/dL — ABNORMAL HIGH (ref 65–99)
Potassium: 4.2 mmol/L (ref 3.5–5.2)
Sodium: 133 mmol/L — ABNORMAL LOW (ref 134–144)

## 2018-06-30 LAB — HEPATITIS C ANTIBODY: Hep C Virus Ab: <0.1 s/co ratio (ref 0.0–0.9)

## 2018-07-02 ENCOUNTER — Encounter: Payer: Self-pay | Admitting: Internal Medicine

## 2018-07-02 NOTE — Assessment & Plan Note (Addendum)
Gregory Sosa has a history of uncontrolled hypertension that he has attempted to manage with lifestyle modifications at home.  Unfortunately his blood pressure remains uncontrolled, 169/86 today. He is asymptomatic. He states at home his BP is 140-150s/90s.  We discussed long-term complications of both uncontrolled HTN and DM, and I stressed the importance of treatment to prevent these.  He is agreeable to start medication for this today.  Will start losartan 25 mg QD (has a documented cough on lisinopril). Ordered BMP to check renal function.

## 2018-07-02 NOTE — Assessment & Plan Note (Signed)
Foot exam performed today. Up to date with eye exam.  Declined colonoscopy. Provided with FIT.  Declined flu vaccine and HIV screening.

## 2018-07-02 NOTE — Assessment & Plan Note (Signed)
Mr. Puskarich presents for T2DM follow up.  He is currently on glipizide 10 mg QD and Januvia 100 mg QD which he does not take on a daily basis. A1c is 9.  He does not check his blood glucose at home regularly, but has noticed that his blood sugars have been higher than usual, around the 300s.  He endorses urinary frequency, no polydipsia or urinary urgency.  We discussed adjusting his diabetes regimen for optimal glycemic control which he is agreeable to.  We will discontinue Januvia today and start Invokana 100 mg QD (no history of DKA or PAD). Will continue glipizide 10 mg QD. Foot exam performed today. Will hold off on urine microalbumin in the setting of uncontrolled DM. Follow up in 3 months.

## 2018-07-02 NOTE — Progress Notes (Signed)
   CC: T2DM and HTN follow up  HPI:  GregoryGregory Sosa is a 58 y.o. male with PMH listed below presents to clinic for follow-up of diabetes and hypertension.  Please see problem based assessment and plan for further details.  Past Medical History:  Diagnosis Date  . Diabetes mellitus type 2, uncontrolled (HCC) 03/23/2011  . Dyslipidemia 03/23/2011   21.5% 10-year risk of heart disease or stroke.     [name] Hypertension    Review of Systems:   Review of Systems  HENT: Positive for congestion.   Respiratory: Positive for cough. Negative for shortness of breath.   Cardiovascular: Negative for chest pain, palpitations and leg swelling.  Gastrointestinal: Negative for abdominal pain, diarrhea, nausea and vomiting.  Genitourinary: Positive for frequency. Negative for urgency.  Neurological: Negative for dizziness and headaches.  Endo/Heme/Allergies: Negative for polydipsia.    Physical Exam: Vitals:   06/29/18 1508  BP: (!) 169/86  Pulse: 86  Temp: 97.7 F (36.5 C)  TempSrc: Oral  SpO2: 98%  Weight: 175 lb 4.8 oz (79.5 kg)   General: Well-appearing male, pleasant, in no acute distress CV: RRR, nl S1/S2, no mrg  Pulm: CTAB, oxygenating well on room air Abd: soft, NTND, normoactive bowel sounds  Ext: Warm and well-perfused without lower extremity edema  Assessment & Plan:   See Encounters Tab for problem based charting.  Patient discussed with Dr. Oswaldo Done

## 2018-07-02 NOTE — Progress Notes (Signed)
Internal Medicine Clinic Attending  Case discussed with Dr. Santos-Sanchez at the time of the visit.  We reviewed the resident's history and exam and pertinent patient test results.  I agree with the assessment, diagnosis, and plan of care documented in the resident's note.    

## 2018-07-05 ENCOUNTER — Telehealth: Payer: Self-pay | Admitting: Internal Medicine

## 2018-07-05 NOTE — Telephone Encounter (Signed)
rtc to pt, informed [facility] at [facility], he states he wants his at [facility], [facility] not anything at [facility], gave him directions on having transfer, he will do so, [facility] removed from profile

## 2018-07-05 NOTE — Telephone Encounter (Signed)
Pt wanting to send all his prescription to [facility], pt contact [phone]

## 2018-09-08 MED FILL — glipiZIDE ER 10 MG TB24: 10 | 90 days supply | Qty: 90 | Fill #3

## 2018-12-25 ENCOUNTER — Other Ambulatory Visit: Payer: Self-pay | Admitting: Internal Medicine

## 2018-12-25 DIAGNOSIS — IMO0001 Reserved for inherently not codable concepts without codable children: Secondary | ICD-10-CM

## 2018-12-26 MED FILL — glipiZIDE ER 10 MG TB24: 10 | 90 days supply | Qty: 90 | Fill #0

## 2018-12-26 NOTE — Telephone Encounter (Signed)
Overdue PCP appt. pls sch PCP next 3 months DM FU

## 2018-12-27 ENCOUNTER — Other Ambulatory Visit: Payer: Self-pay | Admitting: Internal Medicine

## 2018-12-27 DIAGNOSIS — IMO0001 Reserved for inherently not codable concepts without codable children: Secondary | ICD-10-CM

## 2019-01-15 ENCOUNTER — Other Ambulatory Visit: Payer: Self-pay | Admitting: *Deleted

## 2019-01-15 DIAGNOSIS — E1165 Type 2 diabetes mellitus with hyperglycemia: Secondary | ICD-10-CM

## 2019-01-16 MED ORDER — CANAGLIFLOZIN 100 MG PO TABS: 100.0000 mg | ORAL_TABLET | Freq: Every day | ORAL | 1 refills | Status: DC

## 2019-02-18 ENCOUNTER — Ambulatory Visit (INDEPENDENT_AMBULATORY_CARE_PROVIDER_SITE_OTHER): Payer: 59 | Admitting: Internal Medicine

## 2019-02-18 ENCOUNTER — Other Ambulatory Visit: Payer: Self-pay

## 2019-02-18 VITALS — BP 160/94 | HR 74 | Temp 97.8°F | Ht 66.0 in | Wt 180.4 lb

## 2019-02-18 DIAGNOSIS — Z79899 Other long term (current) drug therapy: Secondary | ICD-10-CM | POA: Diagnosis not present

## 2019-02-18 DIAGNOSIS — E1165 Type 2 diabetes mellitus with hyperglycemia: Secondary | ICD-10-CM | POA: Diagnosis not present

## 2019-02-18 DIAGNOSIS — M25512 Pain in left shoulder: Secondary | ICD-10-CM | POA: Diagnosis not present

## 2019-02-18 DIAGNOSIS — E119 Type 2 diabetes mellitus without complications: Secondary | ICD-10-CM | POA: Diagnosis not present

## 2019-02-18 DIAGNOSIS — Z7984 Long term (current) use of oral hypoglycemic drugs: Secondary | ICD-10-CM

## 2019-02-18 DIAGNOSIS — I10 Essential (primary) hypertension: Secondary | ICD-10-CM

## 2019-02-18 LAB — GLUCOSE, CAPILLARY: Glucose-Capillary: 130 mg/dL — ABNORMAL HIGH (ref 70–99)

## 2019-02-18 LAB — POCT GLYCOSYLATED HEMOGLOBIN (HGB A1C): Hemoglobin A1C: 7.2 % — AB (ref 4.0–5.6)

## 2019-02-18 MED ORDER — KETOROLAC TROMETHAMINE 30 MG/ML IJ SOLN
30.0000 mg | Freq: Once | INTRAMUSCULAR | Status: AC
  Administered 2019-02-18: 30 mg via INTRAMUSCULAR

## 2019-02-18 NOTE — Patient Instructions (Signed)
Gregory Sosa,   Continue taking invokana and glipizide as usual.   For your blood pressure: Please record your blood pressure every day at the next month and bring this to your next visit.  Make sure to bring the blood pressure cuff as well so we can make sure it is well calibrated.  For your shoulder pain: This may be due to impingement syndrome.  He can treat the pain with ibuprofen or Tylenol as well as ice packs or heating pads.  I have made a referral to sports medicine so they can further evaluate your shoulder pain.  Come back to see me in 3 months to recheck your blood pressure and see how your shoulder pain is doing.  You can also see me sooner if needed.  - Dr. Frederico Hamman

## 2019-02-19 ENCOUNTER — Encounter: Payer: Self-pay | Admitting: Internal Medicine

## 2019-02-19 DIAGNOSIS — M25512 Pain in left shoulder: Secondary | ICD-10-CM | POA: Insufficient documentation

## 2019-02-19 LAB — CMP14 + ANION GAP
ALT: 33 IU/L (ref 0–44)
AST: 22 IU/L (ref 0–40)
Albumin/Globulin Ratio: 1.8 (ref 1.2–2.2)
Albumin: 4.8 g/dL (ref 3.8–4.9)
Alkaline Phosphatase: 77 IU/L (ref 39–117)
Anion Gap: 19 mmol/L — ABNORMAL HIGH (ref 10.0–18.0)
BUN/Creatinine Ratio: 14 (ref 9–20)
BUN: 14 mg/dL (ref 6–24)
Bilirubin Total: 0.3 mg/dL (ref 0.0–1.2)
CO2: 14 mmol/L — ABNORMAL LOW (ref 20–29)
Calcium: 10.9 mg/dL — ABNORMAL HIGH (ref 8.7–10.2)
Chloride: 107 mmol/L — ABNORMAL HIGH (ref 96–106)
Creatinine, Ser: 1 mg/dL (ref 0.76–1.27)
GFR calc Af Amer: 95 mL/min/{1.73_m2} (ref >59)
GFR calc non Af Amer: 83 mL/min/{1.73_m2} (ref >59)
Globulin, Total: 2.7 g/dL (ref 1.5–4.5)
Glucose: 120 mg/dL — ABNORMAL HIGH (ref 65–99)
Potassium: 4.5 mmol/L (ref 3.5–5.2)
Sodium: 140 mmol/L (ref 134–144)
Total Protein: 7.5 g/dL (ref 6.0–8.5)

## 2019-02-19 NOTE — Progress Notes (Signed)
   CC: Follow up of T2DM and HTN, and acute L shoulder pain   HPI:  [name] is a 58 y.o. year-old male with PMH listed below who presents to clinic for follow up of T2DM and HTN, and acute L shoulder pain . Please see problem based assessment and plan for further details.   Past Medical History:  Diagnosis Date  . Diabetes mellitus type 2, uncontrolled ([place]) 03/23/2011  . Dyslipidemia 03/23/2011   21.5% 10-year risk of heart disease or stroke.     [name] Hypertension    Review of Systems:   Review of Systems  Constitutional: Negative for chills, fever and malaise/fatigue.  Respiratory: Negative for cough and shortness of breath.   Cardiovascular: Negative for chest pain, palpitations and leg swelling.  Gastrointestinal: Negative for abdominal pain, constipation, diarrhea, nausea and vomiting.  Musculoskeletal: Positive for joint pain (L shoulder pain ).  Neurological: Negative for dizziness and headaches.     Physical Exam:  Vitals:   02/18/19 1605  BP: (!) 160/94  Pulse: 74  Temp: 97.8 F (36.6 C)  TempSrc: Oral  SpO2: 98%  Weight: 180 lb 6.4 oz (81.8 kg)  Height: 5\' 6"  (1.676 m)    General: well appearing male in no acute distress  Cardiac: regular rate and rhythm, nl S1/S2, no murmurs, rubs or gallops  Pulm: CTAB, no wheezes or crackles, no increased work of breathing on room air  MSK: L shoulder ROM is limited by pain with abd- and adduction as well as internal and external rotation      Office Visit from 02/18/2019 in [place]  PHQ-9 Total Score  1       Assessment & Plan:   See Encounters Tab for problem based charting.  Patient discussed with Dr. Rebeca Alert

## 2019-02-19 NOTE — Assessment & Plan Note (Signed)
BP remains uncontrolled, 160/94 at today's visit. We have discussed importance of starting anti-hypertensive medications multiple times at previous visits as well as the complications of long-term uncontrolled HTN, but patient continues to decline. He states he checks his  BP at home and is usually 130-140s. I asked patient to check his BP daily for the next month and bring a BP log to his next visit. Also asked him to bring his BP cuff so we can check if it is calibrated.

## 2019-02-19 NOTE — Assessment & Plan Note (Signed)
Patient presents complaining of progressiveness worsening of non-radiating L shoulder pain for the past 3 months. He does not recall any inciting trauma. He did fell at work earlier today which exacerbated the pain. He denies infectious symptoms or similar symptoms in the past. On exam, pain limits range of motion in all direction but strength and sensation is intact. Suspect shoulder impingement syndrome vs rotator cuff tendinopathy. Gave Toradol 30 mg x1 and referred to sports medicine per patient request.

## 2019-02-19 NOTE — Assessment & Plan Note (Signed)
Patient presents for T2DM follow up. He is on Invokana 100 mg and glipizide 10 mg QD. There has been issues with compliance in the past , but he reports he has been taking his diabetes medication since we last spoke in 06/2018. A1c today is 7.2 from 9.  - Continue invoakna and glipizide at current doses  - Up to date with foot and eye exam  - Discussed starting a statin, patient declined

## 2019-02-20 ENCOUNTER — Encounter: Payer: Self-pay | Admitting: Internal Medicine

## 2019-02-21 ENCOUNTER — Telehealth: Payer: Self-pay | Admitting: *Deleted

## 2019-02-21 ENCOUNTER — Other Ambulatory Visit (INDEPENDENT_AMBULATORY_CARE_PROVIDER_SITE_OTHER): Payer: 59

## 2019-02-21 ENCOUNTER — Telehealth: Payer: Self-pay | Admitting: Internal Medicine

## 2019-02-21 ENCOUNTER — Other Ambulatory Visit: Payer: Self-pay

## 2019-02-21 ENCOUNTER — Encounter: Payer: Self-pay | Admitting: Internal Medicine

## 2019-02-21 ENCOUNTER — Other Ambulatory Visit: Payer: Self-pay | Admitting: Internal Medicine

## 2019-02-21 DIAGNOSIS — E1165 Type 2 diabetes mellitus with hyperglycemia: Secondary | ICD-10-CM

## 2019-02-21 LAB — BLOOD GAS, VENOUS
Acid-base deficit: 0.2 mmol/L (ref 0.0–2.0)
Bicarbonate: 24.3 mmol/L (ref 20.0–28.0)
O2 Saturation: 70.9 %
Patient temperature: 98.6
pCO2, Ven: 41.8 mmHg — ABNORMAL LOW (ref 44.0–60.0)
pH, Ven: 7.382 (ref 7.250–7.430)
pO2, Ven: 39.6 mmHg (ref 32.0–45.0)

## 2019-02-21 LAB — COMPREHENSIVE METABOLIC PANEL
ALT: 35 U/L (ref 0–44)
AST: 29 U/L (ref 15–41)
Albumin: 4.2 g/dL (ref 3.5–5.0)
Alkaline Phosphatase: 67 U/L (ref 38–126)
Anion gap: 11 (ref 5–15)
BUN: 15 mg/dL (ref 6–20)
CO2: 23 mmol/L (ref 22–32)
Calcium: 10.4 mg/dL — ABNORMAL HIGH (ref 8.9–10.3)
Chloride: 103 mmol/L (ref 98–111)
Creatinine, Ser: 0.9 mg/dL (ref 0.61–1.24)
GFR calc Af Amer: >60 mL/min (ref >60)
GFR calc non Af Amer: >60 mL/min (ref >60)
Glucose, Bld: 125 mg/dL — ABNORMAL HIGH (ref 70–99)
Potassium: 4.4 mmol/L (ref 3.5–5.1)
Sodium: 137 mmol/L (ref 135–145)
Total Bilirubin: 0.7 mg/dL (ref 0.3–1.2)
Total Protein: 6.9 g/dL (ref 6.5–8.1)

## 2019-02-21 LAB — GLUCOSE, CAPILLARY: Glucose-Capillary: 122 mg/dL — ABNORMAL HIGH (ref 70–99)

## 2019-02-21 LAB — URINALYSIS, ROUTINE W REFLEX MICROSCOPIC
Bilirubin Urine: NEGATIVE
Glucose, UA: >=500 mg/dL — AB
Hgb urine dipstick: NEGATIVE
Ketones, ur: 20 mg/dL — AB
Leukocytes,Ua: NEGATIVE
Nitrite: NEGATIVE
Protein, ur: NEGATIVE mg/dL
Specific Gravity, Urine: 1.033 — ABNORMAL HIGH (ref 1.005–1.030)
pH: 5 (ref 5.0–8.0)

## 2019-02-21 LAB — BETA-HYDROXYBUTYRIC ACID: Beta-Hydroxybutyric Acid: 0.76 mmol/L — ABNORMAL HIGH (ref 0.05–0.27)

## 2019-02-21 NOTE — Telephone Encounter (Signed)
Spoke w/ dr Isac Sarna, labs look improved, pt is informed and released to f/u. He is agreeable

## 2019-02-21 NOTE — Telephone Encounter (Signed)
After calling multiple times was able to speak w/ pt, he will come in this pm for labs and await values and advice before leaving

## 2019-02-21 NOTE — Telephone Encounter (Signed)
Thank you, agreed. His labs are reassuring, ok to go home.

## 2019-02-21 NOTE — Progress Notes (Signed)
Internal Medicine Clinic Attending  Case discussed with Dr. Santos-Sanchez at the time of the visit.  We reviewed the resident's history and exam and pertinent patient test results.  I agree with the assessment, diagnosis, and plan of care documented in the resident's note.  Alexander Raines, M.D., Ph.D.  

## 2019-02-21 NOTE — Telephone Encounter (Signed)
Patient seen on 10/12 for diabetes follow up. CMP showed BP 120, bicarb 14, and anion gap of 19 concerning for euglycemic DKA which he is at risk for due to use of invokana. Would like patient to come for a lab visit as soon as possible for STAT labs. May need to stop invokana as well depending on lab results. I've called patient x3 this morning, but no answer. I spoke to triage RN to help reaching out to patient.   Welford Roche, MD  Internal Medicine PGY-3  P [phone]

## 2019-02-22 DIAGNOSIS — M25521 Pain in right elbow: Secondary | ICD-10-CM | POA: Diagnosis not present

## 2019-02-22 DIAGNOSIS — M25512 Pain in left shoulder: Secondary | ICD-10-CM | POA: Diagnosis not present

## 2019-02-22 DIAGNOSIS — M5413 Radiculopathy, cervicothoracic region: Secondary | ICD-10-CM | POA: Diagnosis not present

## 2019-02-26 DIAGNOSIS — M25512 Pain in left shoulder: Secondary | ICD-10-CM | POA: Diagnosis not present

## 2019-02-26 DIAGNOSIS — M25521 Pain in right elbow: Secondary | ICD-10-CM | POA: Diagnosis not present

## 2019-02-26 DIAGNOSIS — M5413 Radiculopathy, cervicothoracic region: Secondary | ICD-10-CM | POA: Diagnosis not present

## 2019-02-27 DIAGNOSIS — M5413 Radiculopathy, cervicothoracic region: Secondary | ICD-10-CM | POA: Diagnosis not present

## 2019-02-27 DIAGNOSIS — M25521 Pain in right elbow: Secondary | ICD-10-CM | POA: Diagnosis not present

## 2019-02-27 DIAGNOSIS — M25512 Pain in left shoulder: Secondary | ICD-10-CM | POA: Diagnosis not present

## 2019-03-01 DIAGNOSIS — M25521 Pain in right elbow: Secondary | ICD-10-CM | POA: Diagnosis not present

## 2019-03-01 DIAGNOSIS — M25512 Pain in left shoulder: Secondary | ICD-10-CM | POA: Diagnosis not present

## 2019-03-01 DIAGNOSIS — M5413 Radiculopathy, cervicothoracic region: Secondary | ICD-10-CM | POA: Diagnosis not present

## 2019-03-05 DIAGNOSIS — M25512 Pain in left shoulder: Secondary | ICD-10-CM | POA: Diagnosis not present

## 2019-03-05 DIAGNOSIS — M25521 Pain in right elbow: Secondary | ICD-10-CM | POA: Diagnosis not present

## 2019-03-05 DIAGNOSIS — M5413 Radiculopathy, cervicothoracic region: Secondary | ICD-10-CM | POA: Diagnosis not present

## 2019-03-06 DIAGNOSIS — M25512 Pain in left shoulder: Secondary | ICD-10-CM | POA: Diagnosis not present

## 2019-03-06 DIAGNOSIS — M25521 Pain in right elbow: Secondary | ICD-10-CM | POA: Diagnosis not present

## 2019-03-06 DIAGNOSIS — M5413 Radiculopathy, cervicothoracic region: Secondary | ICD-10-CM | POA: Diagnosis not present

## 2019-03-08 DIAGNOSIS — M25512 Pain in left shoulder: Secondary | ICD-10-CM | POA: Diagnosis not present

## 2019-03-08 DIAGNOSIS — M5413 Radiculopathy, cervicothoracic region: Secondary | ICD-10-CM | POA: Diagnosis not present

## 2019-03-08 DIAGNOSIS — M25521 Pain in right elbow: Secondary | ICD-10-CM | POA: Diagnosis not present

## 2019-03-12 DIAGNOSIS — M25521 Pain in right elbow: Secondary | ICD-10-CM | POA: Diagnosis not present

## 2019-03-12 DIAGNOSIS — M25512 Pain in left shoulder: Secondary | ICD-10-CM | POA: Diagnosis not present

## 2019-03-12 DIAGNOSIS — M5413 Radiculopathy, cervicothoracic region: Secondary | ICD-10-CM | POA: Diagnosis not present

## 2019-03-13 DIAGNOSIS — M5413 Radiculopathy, cervicothoracic region: Secondary | ICD-10-CM | POA: Diagnosis not present

## 2019-03-13 DIAGNOSIS — M25512 Pain in left shoulder: Secondary | ICD-10-CM | POA: Diagnosis not present

## 2019-03-13 DIAGNOSIS — M25521 Pain in right elbow: Secondary | ICD-10-CM | POA: Diagnosis not present

## 2019-03-15 DIAGNOSIS — M25521 Pain in right elbow: Secondary | ICD-10-CM | POA: Diagnosis not present

## 2019-03-15 DIAGNOSIS — M5413 Radiculopathy, cervicothoracic region: Secondary | ICD-10-CM | POA: Diagnosis not present

## 2019-03-15 DIAGNOSIS — M25512 Pain in left shoulder: Secondary | ICD-10-CM | POA: Diagnosis not present

## 2019-03-19 DIAGNOSIS — M25512 Pain in left shoulder: Secondary | ICD-10-CM | POA: Diagnosis not present

## 2019-03-19 DIAGNOSIS — M5413 Radiculopathy, cervicothoracic region: Secondary | ICD-10-CM | POA: Diagnosis not present

## 2019-03-19 DIAGNOSIS — M25521 Pain in right elbow: Secondary | ICD-10-CM | POA: Diagnosis not present

## 2019-03-20 DIAGNOSIS — M5413 Radiculopathy, cervicothoracic region: Secondary | ICD-10-CM | POA: Diagnosis not present

## 2019-03-20 DIAGNOSIS — M25521 Pain in right elbow: Secondary | ICD-10-CM | POA: Diagnosis not present

## 2019-03-20 DIAGNOSIS — M25512 Pain in left shoulder: Secondary | ICD-10-CM | POA: Diagnosis not present

## 2019-03-25 NOTE — Addendum Note (Signed)
Addended by: Hulan Fray on: 03/25/2019 06:21 PM   Modules accepted: Orders

## 2019-03-26 DIAGNOSIS — M5413 Radiculopathy, cervicothoracic region: Secondary | ICD-10-CM | POA: Diagnosis not present

## 2019-03-26 DIAGNOSIS — M25512 Pain in left shoulder: Secondary | ICD-10-CM | POA: Diagnosis not present

## 2019-03-26 DIAGNOSIS — M25521 Pain in right elbow: Secondary | ICD-10-CM | POA: Diagnosis not present

## 2019-04-18 ENCOUNTER — Other Ambulatory Visit: Payer: Self-pay | Admitting: Internal Medicine

## 2019-04-18 MED FILL — glipiZIDE ER 10 MG TB24: 10 | 90 days supply | Qty: 90 | Fill #0

## 2019-06-10 ENCOUNTER — Encounter: Payer: Self-pay | Admitting: Internal Medicine

## 2019-06-10 ENCOUNTER — Other Ambulatory Visit: Payer: Self-pay

## 2019-06-10 ENCOUNTER — Ambulatory Visit: Payer: 59 | Admitting: Internal Medicine

## 2019-06-10 ENCOUNTER — Encounter: Payer: 59 | Admitting: Internal Medicine

## 2019-06-10 VITALS — BP 149/79 | HR 71 | Temp 97.9°F | Ht 66.0 in | Wt 183.2 lb

## 2019-06-10 DIAGNOSIS — Z7984 Long term (current) use of oral hypoglycemic drugs: Secondary | ICD-10-CM

## 2019-06-10 DIAGNOSIS — E1165 Type 2 diabetes mellitus with hyperglycemia: Secondary | ICD-10-CM | POA: Diagnosis not present

## 2019-06-10 DIAGNOSIS — I10 Essential (primary) hypertension: Secondary | ICD-10-CM | POA: Diagnosis not present

## 2019-06-10 DIAGNOSIS — Z79899 Other long term (current) drug therapy: Secondary | ICD-10-CM | POA: Diagnosis not present

## 2019-06-10 DIAGNOSIS — E118 Type 2 diabetes mellitus with unspecified complications: Secondary | ICD-10-CM

## 2019-06-10 LAB — POCT GLYCOSYLATED HEMOGLOBIN (HGB A1C): Hemoglobin A1C: 8.2 % — AB (ref 4.0–5.6)

## 2019-06-10 LAB — GLUCOSE, CAPILLARY: Glucose-Capillary: 131 mg/dL — ABNORMAL HIGH (ref 70–99)

## 2019-06-10 NOTE — Patient Instructions (Addendum)
Gregory Sosa,   Your A1c is going up. Please continue to work on your diet as this can help lower it. Please also continue excersing as weight loss can help lower it too. Continue taking Invokana and glipizide.   Make a follow up appointment with me in 3 months to check back on you diabetes.   - Dr. Evelene Croon

## 2019-06-12 NOTE — Assessment & Plan Note (Signed)
Patient presents for T2DM follow-up. He is on Invokana 100 mg daily and glipizide 10 mg daily.  Somewhat compliant with medications. States he usually forgets to take glipizide because he takes it at nighttime.  A1c increasing 7.2 --> 8.2.  We discussed increasing Invokana dose or adding another medication to his DM regimen, but patient declined several times.  Stated he will continue to work on [employer]. I discussed with him that a low carb diet has not worked for him in the past due to non-adherence, but he expressed having more motivation this time.  -Continue current regimen.  Discussed he can take both medications at same time in AM  -We will consider referral to endocrinologist at next appointment if A1c continues to increase

## 2019-06-12 NOTE — Progress Notes (Signed)
   CC: T2DM and HTN follow up   HPI:  Gregory Sosa is a 59 y.o. year-old male with PMH listed below who presents to clinic for T2DM and HTN follow up. Please see problem based assessment and plan for further details.   Past Medical History:  Diagnosis Date  . Diabetes mellitus type 2, uncontrolled (HCC) 03/23/2011  . Dyslipidemia 03/23/2011   21.5% 10-year risk of heart disease or stroke.     [name] Hypertension    Review of Systems:   Review of Systems  Constitutional: Negative for chills, fever and weight loss.  Gastrointestinal: Negative for abdominal pain, nausea and vomiting.  Genitourinary: Negative for frequency and urgency.    Physical Exam:  Vitals:   06/10/19 1550  BP: (!) 149/79  Pulse: 71  Temp: 97.9 F (36.6 C)  TempSrc: Oral  SpO2: 98%  Weight: 183 lb 3.2 oz (83.1 kg)  Height: 5\' 6"  (1.676 m)   General: well-appearing male in NAD  Cardiac: regular rate and rhythm, nl S1/S2, no murmurs, rubs or gallops Pulm: CTAB, no wheezes or crackles, no increased work of breathing on room air  Ext: warm and well perfused, no peripheral edema  Assessment & Plan:   See Encounters Tab for problem based charting.  Patient discussed with Dr. 

## 2019-06-12 NOTE — Assessment & Plan Note (Signed)
Blood pressure remains above goal.  Patient continues to decline starting any antihypertensive medication.  I again discussed the complications of long-term uncontrolled hypertension.  Hhe stated he will continue to work on a low-sodium diet.

## 2019-06-16 NOTE — Progress Notes (Signed)
Internal Medicine Clinic Attending  Case discussed with Dr. Santos-Sanchez at the time of the visit.  We reviewed the resident's history and exam and pertinent patient test results.  I agree with the assessment, diagnosis, and plan of care documented in the resident's note.    

## 2019-07-16 ENCOUNTER — Other Ambulatory Visit: Payer: Self-pay | Admitting: *Deleted

## 2019-07-16 DIAGNOSIS — E1165 Type 2 diabetes mellitus with hyperglycemia: Secondary | ICD-10-CM

## 2019-07-17 MED ORDER — GLIPIZIDE ER 10 MG PO TB24: 10.0000 mg | ORAL_TABLET | Freq: Every day | ORAL | 1 refills | Status: DC

## 2019-07-17 MED ORDER — CANAGLIFLOZIN 100 MG PO TABS: 100.0000 mg | ORAL_TABLET | Freq: Every day | ORAL | 1 refills | Status: DC

## 2019-07-17 MED FILL — glipiZIDE ER 10 MG TB24: 10 | 90 days supply | Qty: 90 | Fill #0

## 2019-07-17 MED FILL — INVOKANA 100 MG TABLET: 100 | 90 days supply | Qty: 90 | Fill #0

## 2019-07-26 ENCOUNTER — Telehealth: Payer: Self-pay | Admitting: *Deleted

## 2019-07-26 NOTE — Telephone Encounter (Signed)
Patient called in for refill on Invokana. This was sent to [facility] on 07/17/2019. Patient states he uses [facility] on [address]. Rx cancelled at [facility] and called in to [facility] at [facility]. [facility] removed from patient's record. Kinnie Feil, BSN, RN-BC

## 2019-09-03 ENCOUNTER — Encounter: Payer: Self-pay | Admitting: *Deleted

## 2019-09-23 MED FILL — GLIPIZIDE ER 10 MG TB24: 10 | 90 days supply | Qty: 90 | Fill #1

## 2020-01-30 ENCOUNTER — Other Ambulatory Visit: Payer: Self-pay

## 2020-01-30 DIAGNOSIS — E1165 Type 2 diabetes mellitus with hyperglycemia: Secondary | ICD-10-CM

## 2020-01-30 MED ORDER — CANAGLIFLOZIN 100 MG PO TABS: 100.0000 mg | ORAL_TABLET | Freq: Every day | ORAL | 1 refills | Status: DC

## 2020-01-30 NOTE — Telephone Encounter (Signed)
canagliflozin (INVOKANA) 100 MG TABS tablet, REFILL REQUEST @  [facility] [phone] - [place], [place] - [phone] [facility] [address] [address] AT [facility] [address] [facility] Phone:  [phone]  Fax:  [phone]

## 2020-01-31 ENCOUNTER — Other Ambulatory Visit: Payer: Self-pay | Admitting: *Deleted

## 2020-01-31 DIAGNOSIS — E1165 Type 2 diabetes mellitus with hyperglycemia: Secondary | ICD-10-CM

## 2020-01-31 NOTE — Telephone Encounter (Signed)
RTC, patient asking for update on PA for Invokana.  Per Chart review, information was sent via CoverMyMeds today and Venita Sheffield is awaiting determination.  Patient notified of this and verbalized understanding. SChaplin, RN,BSN

## 2020-01-31 NOTE — Telephone Encounter (Addendum)
Information was sent through CoverMyMeds for PA for Invokana.  Awaiting determination.  Angelina Ok, RN 01/31/2020 10:20AM.    PA for Gregory Sosa was denied as patient has not tried [place] which is on the formulary.  Message to Dr.Nguyen Gregory Sosa team.  Angelina Ok, RN 02/04/2020 1:51 PM.

## 2020-01-31 NOTE — Telephone Encounter (Signed)
Pt would like to speak with a nurse, please call pt back about meds.

## 2020-02-04 MED ORDER — EMPAGLIFLOZIN 10 MG PO TABS: 10.0000 mg | ORAL_TABLET | Freq: Every day | ORAL | 0 refills | Status: DC

## 2020-02-04 NOTE — Telephone Encounter (Signed)
Thank you :)

## 2020-02-04 NOTE — Telephone Encounter (Signed)
Will switch to Jardiance and send in rx. Thank you!

## 2020-02-04 NOTE — Telephone Encounter (Signed)
Has been denied.  Patient will need to try Jardiance if appropriate.

## 2020-02-05 NOTE — Telephone Encounter (Signed)
Thank you for letting us know.  

## 2020-02-05 NOTE — Telephone Encounter (Signed)
Patient called in stating wrong medicine was sent to Pharmacy. He was expecting invokana but instead received Jardiance. Explained that his insurance denied the PA for invokana and said he would need to try Jardiance first that is why the Provider sent Rx for Jardiance. He requested to be notified in future if changes are made to his meds. Kinnie Feil, BSN, RN-BC

## 2020-02-06 ENCOUNTER — Other Ambulatory Visit: Payer: Self-pay | Admitting: Internal Medicine

## 2020-02-06 ENCOUNTER — Other Ambulatory Visit: Payer: Self-pay | Admitting: Student

## 2020-02-06 DIAGNOSIS — E1165 Type 2 diabetes mellitus with hyperglycemia: Secondary | ICD-10-CM

## 2020-02-12 ENCOUNTER — Other Ambulatory Visit: Payer: Self-pay

## 2020-02-12 ENCOUNTER — Encounter: Payer: Self-pay | Admitting: Student

## 2020-02-12 ENCOUNTER — Ambulatory Visit (INDEPENDENT_AMBULATORY_CARE_PROVIDER_SITE_OTHER): Payer: 59 | Admitting: Student

## 2020-02-12 VITALS — BP 137/75 | HR 79 | Temp 97.8°F | Ht 66.0 in | Wt 181.6 lb

## 2020-02-12 DIAGNOSIS — Z0001 Encounter for general adult medical examination with abnormal findings: Secondary | ICD-10-CM | POA: Diagnosis not present

## 2020-02-12 DIAGNOSIS — E1165 Type 2 diabetes mellitus with hyperglycemia: Secondary | ICD-10-CM | POA: Diagnosis not present

## 2020-02-12 DIAGNOSIS — Z0000 Encounter for general adult medical examination without abnormal findings: Secondary | ICD-10-CM

## 2020-02-12 DIAGNOSIS — Z1211 Encounter for screening for malignant neoplasm of colon: Secondary | ICD-10-CM

## 2020-02-12 DIAGNOSIS — E785 Hyperlipidemia, unspecified: Secondary | ICD-10-CM

## 2020-02-12 DIAGNOSIS — R972 Elevated prostate specific antigen [PSA]: Secondary | ICD-10-CM

## 2020-02-12 DIAGNOSIS — I10 Essential (primary) hypertension: Secondary | ICD-10-CM | POA: Diagnosis not present

## 2020-02-12 DIAGNOSIS — K219 Gastro-esophageal reflux disease without esophagitis: Secondary | ICD-10-CM | POA: Diagnosis not present

## 2020-02-12 LAB — POCT GLYCOSYLATED HEMOGLOBIN (HGB A1C): Hemoglobin A1C: 9.2 % — AB (ref 4.0–5.6)

## 2020-02-12 LAB — GLUCOSE, CAPILLARY: Glucose-Capillary: 177 mg/dL — ABNORMAL HIGH (ref 70–99)

## 2020-02-12 NOTE — Assessment & Plan Note (Signed)
Patient endorses urinary frequency although he denies any polydipsia, polyuria, N/V, abdominal pain, neuropathy or vision changes. He takes empagliflozin and glipizide once daily without missing any doses. He infrequently monitors his blood sugar at home, which have ranged from 155-255. He notes intolerance to Metformin.  - Will check Hb A1c - Will check BMP - Will check urine microalbumin / creatinine ratio - Will discuss diabetic education and nutrition as well as ophthalmology follow up when calling for results vs. At follow up visit.

## 2020-02-12 NOTE — Progress Notes (Signed)
92

## 2020-02-12 NOTE — Patient Instructions (Signed)
[name],  Today, we discussed that your urinary frequency can be associated with elevated blood sugars, although it is great to hear you are not having any other symptoms with your diabetes. We will recheck your urine for protein and your Hb A1c this visit.   We will also check your cholesterol, liver and kidney function, blood counts, calcium and vitamin D.   Your blood pressure was just slightly high today.  Please continue to work on decreasing salt and simple carbohydrates in your diet and eat plenty of vegetables. Portion control is one of the best ways to improve not only diabetes but also blood pressure and cholesterol.   Keep up the good work and I will call you with lab results to discuss if any more medications may be indicated.  Please make a follow up appointment to see me back in clinic in about 3 months unless you hear otherwise with results.  Thank you,  Dr. Laddie Aquas   Prostate Cancer Screening  Prostate cancer screening is a test that is done to check for the presence of prostate cancer in men. The prostate gland is a walnut-sized gland that is located below the bladder and in front of the rectum in males. The function of the prostate is to add fluid to semen during ejaculation. Prostate cancer is the second most common type of cancer in men. Who should have prostate cancer screening?  Screening recommendations vary based on age and other risk factors. Screening is recommended if:  You are older than age 42. If you are age 54-69, talk with your health care provider about your need for screening and how often screening should be done. Because most prostate cancers are slow growing and will not cause death, screening is generally reserved in this age group for men who have a 10-15-year life expectancy.  You are younger than age 70, and you have these risk factors: ? Being a black male or a male of African descent. ? Having a father, brother, or uncle who has been  diagnosed with prostate cancer. The risk is higher if your family member's cancer occurred at an early age. Screening is not recommended if:  You are younger than age 5.  You are between the ages of 17 and 59 and you have no risk factors.  You are 12 years of age or older. At this age, the risks that screening can cause are greater than the benefits that it may provide. If you are at high risk for prostate cancer, your health care provider may recommend that you have screenings more often or that you start screening at a younger age. How is screening for prostate cancer done? The recommended prostate cancer screening test is a blood test called the prostate-specific antigen (PSA) test. PSA is a protein that is made in the prostate. As you age, your prostate naturally produces more PSA. Abnormally high PSA levels may be caused by:  Prostate cancer.  An enlarged prostate that is not caused by cancer (benign prostatic hyperplasia, BPH). This condition is very common in older men.  A prostate gland infection (prostatitis). Depending on the PSA results, you may need more tests, such as:  A physical exam to check the size of your prostate gland.  Blood and imaging tests.  A procedure to remove tissue samples from your prostate gland for testing (biopsy). What are the benefits of prostate cancer screening?  Screening can help to identify cancer at an early stage, before symptoms start and  when the cancer can be treated more easily.  There is a small chance that screening may lower your risk of dying from prostate cancer. The chance is small because prostate cancer is a slow-growing cancer, and most men with prostate cancer die from a different cause. What are the risks of prostate cancer screening? The main risk of prostate cancer screening is diagnosing and treating prostate cancer that would never have caused any symptoms or problems. This is called overdiagnosisand overtreatment. PSA  screening cannot tell you if your PSA is high due to cancer or a different cause. A prostate biopsy is the only procedure to diagnose prostate cancer. Even the results of a biopsy may not tell you if your cancer needs to be treated. Slow-growing prostate cancer may not need any treatment other than monitoring, so diagnosing and treating it may cause unnecessary stress or other side effects. A prostate biopsy may also cause:  Infection or fever.  A false negative. This is a result that shows that you do not have prostate cancer when you actually do have prostate cancer. Questions to ask your health care provider  When should I start prostate cancer screening?  What is my risk for prostate cancer?  How often do I need screening?  What type of screening tests do I need?  How do I get my test results?  What do my results mean?  Do I need treatment? Where to find more information  The American Cancer Society: www.cancer.org  American Urological Association: www.auanet.org Contact a health care provider if:  You have difficulty urinating.  You have pain when you urinate or ejaculate.  You have blood in your urine or semen.  You have pain in your back or in the area of your prostate. Summary  Prostate cancer is a common type of cancer in men. The prostate gland is located below the bladder and in front of the rectum. This gland adds fluid to semen during ejaculation.  Prostate cancer screening may identify cancer at an early stage, when the cancer can be treated more easily.  The prostate-specific antigen (PSA) test is the recommended screening test for prostate cancer.  Discuss the risks and benefits of prostate cancer screening with your health care provider. If you are age 86 or older, the risks that screening can cause are greater than the benefits that it may provide. This information is not intended to replace advice given to you by your health care provider. Make sure you  discuss any questions you have with your health care provider. Document Revised: 12/06/2018 Document Reviewed: 12/06/2018 Elsevier Patient Education  2020 [employer].

## 2020-02-12 NOTE — Assessment & Plan Note (Signed)
Patient has history of hyperlipidemia with total cholesterol 191, but LDL 109, triglycerides 249, and HDL 32. Patient has not been on a statin; he had been prescribed ezetimibe in 2018 but did not take this medication. - Will recheck lipid panel  - Will discuss results/possible therapy by phone

## 2020-02-12 NOTE — Assessment & Plan Note (Signed)
Patient prefers not to get any vaccinations today after discussion, and would not like to have colonoscopy. - Advised to call if patient changes his mind - Will see if FIT testing is expired

## 2020-02-12 NOTE — Assessment & Plan Note (Signed)
Patient had been on Protonix in the past for GERD although denies any history of heartburn today. - Will continue to monitor for symptoms

## 2020-02-12 NOTE — Assessment & Plan Note (Signed)
Patient has a history of elevated PSA in 2014. He endorses urinary frequency in the setting of hyperglycemia, but denies trouble initiating urinary screen, constipation, abdominal pain or any other symptoms.  - Discussed if PSA were elevated on recheck, would require urology follow up - Patient not interested in repeat testing today

## 2020-02-12 NOTE — Assessment & Plan Note (Addendum)
Patient's blood pressure mildly elevated today to 144/83, improved on repeat check to 137/75. Patient has experienced cough on lisinopril and states he had been prescribed HCTZ in the past, although said he prefers not to take medications and had not filled this prescription. Patient stated he is not interested in adding antihypertensive today, although open to discussion for call back on lab results.  BP Readings from Last 3 Encounters:  02/12/20 137/75  06/10/19 (!) 149/79  02/18/19 (!) 160/94   - Will hold off on starting medication today - Will discuss possibility of starting Losartan given comorbid diabetes.  - Continue close monitoring

## 2020-02-12 NOTE — Progress Notes (Signed)
   CC: Urinary frequency  HPI:  GregoryGregory Sosa is a 59 y.o. male w/ PMHx as noted below, presenting for routine follow up with only complaint of mild urinary frequency. He states that he has had type II diabetes for about 6 years now, and currently takes Empaglilflozin 10mg  in the morning and Glipizide 10mg  in the evening without missing any doses. He says he checks his sugars infrequently at home, but they have ranged between 155-255. He states he has been told he has HTN in the past and was prescribed HCTZ, but did not take it, stating he prefers not to start any new medications today. He also says he had been given FIT testing in the past, but says he did not complete it and is not interested in colonoscopy or any vaccinations today. He denies any troubles initiating urination, polydipsia, polyuria, nausea, vomiting, abdominal pain, chest pain, SOB, cough, lower extremity edema, light-headedness, dizziness, or any other symptoms.   Past Medical History:  Diagnosis Date  . Diabetes mellitus type 2, uncontrolled ([facility]) 03/23/2011  . Dyslipidemia 03/23/2011   21.5% 10-year risk of heart disease or stroke.     . Elevated PSA   . Hypercalcemia   . Hypertension    Review of Systems:  All others negative except as noted above in HPI.   Physical Exam:  Vitals:   02/12/20 1351 02/12/20 1442  BP: (!) 144/83 137/75  Pulse: 87 79  Temp: 97.8 F (36.6 C)   TempSrc: Oral   SpO2: 90%   Weight: 181 lb 9.6 oz (82.4 kg)   Height: 5\' 6"  (1.676 m)    General: Patient appears well. No acute distress. Eyes: Sclera non-icteric. No conjunctival injection.  HENT: Neck is supple. No nasal discharge. Respiratory: Lungs are CTA, bilaterally. No wheezes, rales, or rhonchi.  Cardiovascular: Regular rate and rhythm. No murmurs, rubs, or gallops. There is trace bilateral lower extremity pitting edema. Distal pulses are 2+ in all four extremities.  Abdominal: Obese and distended, but soft and non-tender to  palpation. Bowel sounds intact. No rebound or guarding. Skin: Bilateral feet have calluses, but no lesions. No other lesions or rashes noted. Psych: Normal affect. Normal tone of voice.   Assessment & Plan:   See Encounters Tab for problem based charting.  Patient seen with Dr. 04/13/20.   04/13/20, MD 02/12/2020, 3:40 PM Pager: [phone]

## 2020-02-12 NOTE — Assessment & Plan Note (Signed)
Patient has a history of mild, asymptomatic hypercalcemia to the mid-10's. PTH tested previously was within normal limits.  - Will check BMP - Will check vitamin D levels - Consider urine studies to rule out [facility] if vitamin D within normal limits

## 2020-02-12 NOTE — Assessment & Plan Note (Signed)
Patient denies ever having a colonoscopy. He states he was offered FIT testing in the past and prefers this route.  - He says he will check whether his FIT testing is out of date prior to discussing results in the next few days

## 2020-02-13 LAB — URINALYSIS, ROUTINE W REFLEX MICROSCOPIC
Bilirubin, UA: NEGATIVE
Leukocytes,UA: NEGATIVE
Nitrite, UA: NEGATIVE
Protein,UA: NEGATIVE
RBC, UA: NEGATIVE
Specific Gravity, UA: >=1.03 — AB (ref 1.005–1.030)
Urobilinogen, Ur: 0.2 mg/dL (ref 0.2–1.0)
pH, UA: 5 (ref 5.0–7.5)

## 2020-02-13 LAB — LIPID PANEL
Chol/HDL Ratio: 7.9 ratio — ABNORMAL HIGH (ref 0.0–5.0)
Cholesterol, Total: 286 mg/dL — ABNORMAL HIGH (ref 100–199)
HDL: 36 mg/dL — ABNORMAL LOW (ref >39)
LDL Chol Calc (NIH): 204 mg/dL — ABNORMAL HIGH (ref 0–99)
Triglycerides: 234 mg/dL — ABNORMAL HIGH (ref 0–149)
VLDL Cholesterol Cal: 46 mg/dL — ABNORMAL HIGH (ref 5–40)

## 2020-02-13 LAB — CMP14 + ANION GAP
ALT: 18 IU/L (ref 0–44)
AST: 17 IU/L (ref 0–40)
Albumin/Globulin Ratio: 1.6 (ref 1.2–2.2)
Albumin: 4.4 g/dL (ref 3.8–4.9)
Alkaline Phosphatase: 82 IU/L (ref 44–121)
Anion Gap: 19 mmol/L — ABNORMAL HIGH (ref 10.0–18.0)
BUN/Creatinine Ratio: 10 (ref 9–20)
BUN: 16 mg/dL (ref 6–24)
Bilirubin Total: 0.3 mg/dL (ref 0.0–1.2)
CO2: 20 mmol/L (ref 20–29)
Calcium: 10.8 mg/dL — ABNORMAL HIGH (ref 8.7–10.2)
Chloride: 101 mmol/L (ref 96–106)
Creatinine, Ser: 1.65 mg/dL — ABNORMAL HIGH (ref 0.76–1.27)
GFR calc Af Amer: 52 mL/min/{1.73_m2} — ABNORMAL LOW (ref >59)
GFR calc non Af Amer: 45 mL/min/{1.73_m2} — ABNORMAL LOW (ref >59)
Globulin, Total: 2.7 g/dL (ref 1.5–4.5)
Glucose: 174 mg/dL — ABNORMAL HIGH (ref 65–99)
Potassium: 4.5 mmol/L (ref 3.5–5.2)
Sodium: 140 mmol/L (ref 134–144)
Total Protein: 7.1 g/dL (ref 6.0–8.5)

## 2020-02-13 LAB — VITAMIN D 25 HYDROXY (VIT D DEFICIENCY, FRACTURES): Vit D, 25-Hydroxy: 19.6 ng/mL — ABNORMAL LOW (ref 30.0–100.0)

## 2020-02-13 LAB — CBC
Hematocrit: 47.1 % (ref 37.5–51.0)
Hemoglobin: 16.2 g/dL (ref 13.0–17.7)
MCH: 30.2 pg (ref 26.6–33.0)
MCHC: 34.4 g/dL (ref 31.5–35.7)
MCV: 88 fL (ref 79–97)
Platelets: 252 10*3/uL (ref 150–450)
RBC: 5.37 x10E6/uL (ref 4.14–5.80)
RDW: 14 % (ref 11.6–15.4)
WBC: 6.5 10*3/uL (ref 3.4–10.8)

## 2020-02-13 NOTE — Progress Notes (Signed)
DOS 02/12/20 Internal Medicine Clinic Attending  I saw and evaluated the patient.  I personally confirmed the key portions of the history and exam documented by Dr. Laddie Aquas and I reviewed pertinent patient test results.  The assessment, diagnosis, and plan were formulated together and I agree with the documentation in the resident's note.

## 2020-02-17 ENCOUNTER — Telehealth: Payer: Self-pay | Admitting: Student

## 2020-02-17 NOTE — Telephone Encounter (Signed)
Pls contact pt regarding results [phone]

## 2020-02-18 ENCOUNTER — Other Ambulatory Visit: Payer: Self-pay

## 2020-02-18 ENCOUNTER — Ambulatory Visit
Admission: EM | Admit: 2020-02-18 | Discharge: 2020-02-18 | Disposition: A | Discharge status: Home / Self Care | Payer: 59 | Attending: Family Medicine | Admitting: Family Medicine

## 2020-02-18 DIAGNOSIS — R220 Localized swelling, mass and lump, head: Secondary | ICD-10-CM

## 2020-02-18 DIAGNOSIS — R590 Localized enlarged lymph nodes: Secondary | ICD-10-CM

## 2020-02-18 DIAGNOSIS — T63444A Toxic effect of venom of bees, undetermined, initial encounter: Secondary | ICD-10-CM

## 2020-02-18 MED ORDER — DEXAMETHASONE SODIUM PHOSPHATE 10 MG/ML IJ SOLN
10.0000 mg | Freq: Once | INTRAMUSCULAR | Status: AC
  Administered 2020-02-18: 10 mg via INTRAMUSCULAR

## 2020-02-18 MED ORDER — FAMOTIDINE 40 MG PO TABS: 40.0000 mg | ORAL_TABLET | Freq: Every day | ORAL | 0 refills | Status: DC

## 2020-02-18 MED ORDER — HYDROXYZINE HCL 25 MG PO TABS: 50.0000 mg | ORAL_TABLET | Freq: Two times a day (BID) | ORAL | 0 refills | Status: DC | PRN

## 2020-02-18 NOTE — ED Provider Notes (Signed)
[facility]    CSN: [id number] Arrival date & time: 02/18/20  0853      History   Chief Complaint Chief Complaint  Patient presents with  . Insect Bite    yesterday.     HPI Raquel Canizalez is a 59 y.o. male.   HPI Patient sustained a bee or hornet sting x1 day ago to the lower right side of his chin.  Since this time he has had lower lip swelling and right-sided facial swelling.  He denies difficulty breathing or swallowing.  No known prior reaction to bee stings. He has taken 2 Claritin since injury.  Past Medical History:  Diagnosis Date  . Diabetes mellitus type 2, uncontrolled ([facility]) 03/23/2011  . Dyslipidemia 03/23/2011   21.5% 10-year risk of heart disease or stroke.     . Elevated PSA   . Hypercalcemia   . Hypertension     Patient Active Problem List   Diagnosis Date Noted  . Acute pain of left shoulder 02/19/2019  . Colon cancer screening 03/03/2017  . Hypercalcemia, asymptomatic  10/13/2014  . GERD (gastroesophageal reflux disease) 10/09/2012  . Elevated PSA, between 10 and less than 20 ng/ml 08/02/2012  . Health care maintenance 06/19/2012  . H/O atypical chest pain 03/23/2011  . Hypertension 03/23/2011  . Diabetes mellitus type 2, uncontrolled ([facility]) 03/23/2011  . Dyslipidemia 03/23/2011    History reviewed. No pertinent surgical history.     Home Medications    Prior to Admission medications   Medication Sig Start Date End Date Taking? Authorizing Provider  Blood Glucose Monitoring Suppl (ONE TOUCH ULTRA SYSTEM KIT) W/DEVICE KIT Use to check sugar once to twice a day. Dx code: 250.00. 06/27/13  Yes Jones Bales, MD  glipiZIDE (GLUCOTROL XL) 10 MG 24 hr tablet Take 1 tablet (10 mg total) by mouth daily with breakfast. 07/17/19  Yes Santos-Sanchez, Merlene Morse, MD  glucose blood (BAYER CONTOUR NEXT TEST) test strip Use as instructed 06/18/13  Yes Jones Bales, MD  glucose blood (ONE TOUCH TEST STRIPS) test strip Use to check sugar once  to twice a day. Dx code: 250.00. 06/27/13  Yes Jones Bales, MD  Lancets [facility] ULTRASOFT) lancets Use to check sugar once to twice a day. Dx code: 250.00. 06/27/13  Yes Jones Bales, MD  empagliflozin (JARDIANCE) 10 MG TABS tablet Take 1 tablet (10 mg total) by mouth daily. 02/06/20   Jean Rosenthal, MD    Family History Family History  Problem Relation Age of Onset  . Heart disease Father     Social History Social History   Tobacco Use  . Smoking status: Former Smoker    Packs/day: 0.50    Types: Cigarettes    Quit date: 02/06/2013    Years since quitting: 7.0  . Smokeless tobacco: Never Used  Vaping Use  . Vaping Use: Never used  Substance Use Topics  . Alcohol use: Yes    Comment: Wine  . Drug use: Never     Allergies   Lisinopril and Metformin and related  Review of Systems Review of Systems Pertinent negatives listed in HPI   Physical Exam Triage Vital Signs ED Triage Vitals  Enc Vitals Group     BP 02/18/20 1010 (!) 152/90     Pulse Rate 02/18/20 1010 85     Resp 02/18/20 1010 18     Temp 02/18/20 1010 98.2 F (36.8 C)     Temp Source 02/18/20 1010 Oral  SpO2 02/18/20 1010 98 %     Weight --      Height --      Head Circumference --      Peak Flow --      Pain Score 02/18/20 1011 4     Pain Loc --      Pain Edu? --      Excl. in [place]? --    No data found.  Updated Vital Signs BP (!) 152/90 (BP Location: Right Arm)   Pulse 85   Temp 98.2 F (36.8 C) (Oral)   Resp 18   SpO2 98%   Visual Acuity Right Eye Distance:   Left Eye Distance:   Bilateral Distance:    Right Eye Near:   Left Eye Near:    Bilateral Near:     Physical Exam Constitutional:      General: He is not in acute distress.    Appearance: He is not ill-appearing.  HENT:     Head: Normocephalic. No right periorbital erythema or left periorbital erythema.     Jaw: Swelling present. No trismus.     Salivary Glands: Right salivary gland is not diffusely enlarged  or tender. Left salivary gland is not diffusely enlarged or tender.   Cardiovascular:     Rate and Rhythm: Normal rate and regular rhythm.  Pulmonary:     Effort: Pulmonary effort is normal.     Breath sounds: Normal breath sounds and air entry.  Neurological:     Mental Status: He is alert and oriented to person, place, and time.     Cranial Nerves: Cranial nerves are intact.  Psychiatric:        Attention and Perception: Attention normal.        Mood and Affect: Mood normal.      UC Treatments / Results  Labs (all labs ordered are listed, but only abnormal results are displayed) Labs Reviewed - No data to display  EKG   Radiology No results found.  Procedures Procedures (including critical care time)  Medications Ordered in UC Medications  dexamethasone (DECADRON) injection 10 mg (10 mg Intramuscular Given 02/18/20 1052)    Initial Impression / Assessment and Plan / UC Course  I have reviewed the triage vital signs and the nursing notes.  Pertinent labs & imaging results that were available during my care of the patient were reviewed by me and considered in my medical decision making (see chart for details).     Facial swelling related to insect sting. Patient is stable. Decadron 10 mg IM given in clinic today. Given diabetes, will defer prescribing oral prednisone. Recommend starting famotidine 40 mg daily x 5 days along with PRN hydroxyzine for itching or swelling. Red flags discussed and strict return precaution given.  Final Clinical Impressions(s) / UC Diagnoses   Final diagnoses:  Right facial swelling  Toxic reaction to hornets, wasps and bees, undetermined intent, initial encounter  Cervical adenopathy     Discharge Instructions     You received a steroid shot today resolve reaction.  I would like for you to start the famotidine 40 mg once today you will take 1 tablet daily over the course of the next 5 days this will also help resolve the current  reaction that you are having from the bee sting injury. I have also prescribed hydroxyzine you can take this 1-2 times per day as needed for any itching or swelling related to the bee sting.  Medication causes profound drowsiness so  avoid driving while taking medication.  Continue to monitor for signs of worsening face facial swelling or redness if any of these do occur or you develop any shortness of breath go immediately to the emergency department.    ED Prescriptions    Medication Sig Dispense Auth. Provider   famotidine (PEPCID) 40 MG tablet Take 1 tablet (40 mg total) by mouth daily for 5 days. 5 tablet Scot Jun, FNP   hydrOXYzine (ATARAX/VISTARIL) 25 MG tablet Take 2 tablets (50 mg total) by mouth 2 (two) times daily as needed (allergic reaction). 20 tablet Scot Jun, FNP     PDMP not reviewed this encounter.   Scot Jun, FNP 02/23/20 1058

## 2020-02-18 NOTE — ED Triage Notes (Signed)
Pt states that he was stung by a bee/wasp yesterday evening and has lip swelling (lower) and some pain on both sides of his face. Pt states he took 35 mg of Claritin last night with no relief. Pt has elevated blood pressure but states he has been prescribed hctz and has it at home but is not taking it. Pt is aox4 and ambulatory.

## 2020-02-18 NOTE — Discharge Instructions (Addendum)
You received a steroid shot today resolve reaction.  I would like for you to start the famotidine 40 mg once today you will take 1 tablet daily over the course of the next 5 days this will also help resolve the current reaction that you are having from the bee sting injury. I have also prescribed hydroxyzine you can take this 1-2 times per day as needed for any itching or swelling related to the bee sting.  Medication causes profound drowsiness so avoid driving while taking medication.  Continue to monitor for signs of worsening face facial swelling or redness if any of these do occur or you develop any shortness of breath go immediately to the emergency department.

## 2020-02-19 ENCOUNTER — Other Ambulatory Visit: Payer: Self-pay | Admitting: Student

## 2020-02-19 DIAGNOSIS — E1165 Type 2 diabetes mellitus with hyperglycemia: Secondary | ICD-10-CM

## 2020-02-19 DIAGNOSIS — E785 Hyperlipidemia, unspecified: Secondary | ICD-10-CM

## 2020-02-19 MED ORDER — PIOGLITAZONE HCL 15 MG PO TABS: 15.0000 mg | ORAL_TABLET | Freq: Every day | ORAL | 0 refills | Status: DC

## 2020-02-19 MED ORDER — LIRAGLUTIDE 18 MG/3ML ~~LOC~~ SOPN: 0.6000 mg | PEN_INJECTOR | Freq: Every day | SUBCUTANEOUS | 1 refills | Status: DC

## 2020-02-19 MED ORDER — ROSUVASTATIN CALCIUM 20 MG PO TABS: 20.0000 mg | ORAL_TABLET | Freq: Every day | ORAL | 1 refills | Status: DC

## 2020-02-19 NOTE — Telephone Encounter (Signed)
I was able to reach out to Gregory Sosa and inform him that his cholesterol levels are significantly elevated and that his labs are showing euglycemic DKA. His vitamin D levels were also slightly low. I instructed him to stop taking Jardiance and to pick up vitamin D 1000 units daily OTC. I prescribed rosuvastatin 20mg  daily and liraglutide 0.6mg  daily to his pharmacy. He notes that he is open to taking Rosuvastatin but is hesitant to start injection. Patient endorses intolerance to metformin in the past.   Addendum: Switched liraglutide 0.6mg  to Pioglitazone 15mg  daily (patient noted insurance coverage concerns with other medications). If he has trouble with pioglitazone, would consider Rybelsus vs. Sitagliptin. Patient notes his insurance had not covered Invokana in the past.   Instructed patient to follow up in clinic before the end of the month to assess how his sugars are doing. Patient voiced understanding without questions or concerns. Was told to present sooner for side effects from his medication.  , PGY1 Internal Medicine [phone]

## 2020-03-03 ENCOUNTER — Other Ambulatory Visit: Payer: Self-pay

## 2020-03-03 ENCOUNTER — Ambulatory Visit: Payer: 59 | Admitting: Student

## 2020-03-03 ENCOUNTER — Encounter: Payer: Self-pay | Admitting: Student

## 2020-03-03 VITALS — BP 156/77 | HR 72 | Temp 98.4°F | Ht 66.0 in | Wt 182.2 lb

## 2020-03-03 DIAGNOSIS — K219 Gastro-esophageal reflux disease without esophagitis: Secondary | ICD-10-CM | POA: Diagnosis not present

## 2020-03-03 DIAGNOSIS — E559 Vitamin D deficiency, unspecified: Secondary | ICD-10-CM | POA: Insufficient documentation

## 2020-03-03 DIAGNOSIS — E782 Mixed hyperlipidemia: Secondary | ICD-10-CM

## 2020-03-03 DIAGNOSIS — I10 Essential (primary) hypertension: Secondary | ICD-10-CM | POA: Diagnosis not present

## 2020-03-03 DIAGNOSIS — E1165 Type 2 diabetes mellitus with hyperglycemia: Secondary | ICD-10-CM

## 2020-03-03 DIAGNOSIS — R972 Elevated prostate specific antigen [PSA]: Secondary | ICD-10-CM

## 2020-03-03 DIAGNOSIS — Z1211 Encounter for screening for malignant neoplasm of colon: Secondary | ICD-10-CM

## 2020-03-03 MED ORDER — RYBELSUS 3 MG PO TABS
3.0000 mg | ORAL_TABLET | Freq: Every day | ORAL | 0 refills | Status: DC
Start: 2020-03-03 — End: 2020-04-02

## 2020-03-03 NOTE — Assessment & Plan Note (Signed)
Labs 02/12/20 showed total cholesterol 286, LDL 204, TG 234, and HDL 36. He was started on Rosuvastatin 20mg  last visit, but states he hasn't been taking this as he decreased his consumption of eggs in his diet.  - Explained to patient his significantly elevated risk of heart disease or stroke - Patient will try Rosuvastatin 20mg  daily and monitor for myositis - Will need repeat lipid testing and CMP by January 2022

## 2020-03-03 NOTE — Assessment & Plan Note (Signed)
BP Readings from Last 3 Encounters:  03/03/20 (!) 156/77  02/18/20 (!) 152/90  02/12/20 137/75   Patient's blood pressures have been elevated this month and continue to remain elevated to 156/77 on repeat testing today. He has been prescribed HCTZ 12.5mg  daily in the past but states he has not been taking this and has extra pills at home. We discussed starting Losartan given cough on lisinopril, although he states he would prefer to try his leftover HCTZ for now before considering additional daily medication.  - Encouraged patient to take remaining HCTZ 12.5mg  once daily until follow up visit - Monitor closely and consider starting Losartan 25mg  at next appointment if pressures remain elevated - Risks and benefits explained to patient.

## 2020-03-03 NOTE — Assessment & Plan Note (Signed)
25-OH vitamin D levels 19.6 this month. - Continue OTC supplement 1000 units daily - Repeat vitamin D levels in January 2022.

## 2020-03-03 NOTE — Assessment & Plan Note (Signed)
Patient has a history of elevated PSA in 2014. He continues to decline repeat PSA testing. Denies any urinary difficulties or complaints.  - Continue to discuss at future visits

## 2020-03-03 NOTE — Assessment & Plan Note (Signed)
Patient notes a history of GERD although denies any heartburn and has not been taking any medications for this recently.  - Discontinued Protonix.

## 2020-03-03 NOTE — Progress Notes (Addendum)
   CC: Hyperglycemia  HPI:  Gregory Sosa is a 59 y.o. gentleman with PMHx uncontrolled type II DM, HLD, HTN, AKI, Hypercalcemia, vitamin D deficiency and elevated PSA presenting for follow up of DM with hyperglycemia. He stopped taking Empagliflozin a couple of weeks ago due to euglycemic DKA and was started on Pioglitazone. He states that he continues to take glipizide daily although notes worsening sugars on pioglitazone. He notes that he responded well to Invokana in the past but his insurance stopped covering it. He states that he stopped taking Rosuvastatin as he stopped consuming eggs and believes his cholesterol is under better control with dietary changes. He prefers not to start a new blood pressure medication, but states he has HCTZ at home that he can take. He denies any polydipsia, polyuria, nausea, vomiting, abdominal pain, light-headedness, dizziness, confusion, or any other symptoms.  Past Medical History:  Diagnosis Date  . Diabetes mellitus type 2, uncontrolled (HCC) 03/23/2011  . Dyslipidemia 03/23/2011   21.5% 10-year risk of heart disease or stroke.     . Elevated PSA   . Hypercalcemia   . Hypertension    Family History  Problem Relation Age of Onset  . Heart disease Father    Social Hx: Patient is a former smoker and stopped smoking in 2014.   Review of Systems:  All others negative except as noted above in HPI.   Physical Exam:  Vitals:   03/03/20 0929 03/03/20 0935  BP: (!) 153/77 (!) 156/77  Pulse: 71 72  Temp: 98.4 F (36.9 C)   TempSrc: Oral   SpO2: 98%   Weight: 182 lb 3.2 oz (82.6 kg)   Height: 5\' 6"  (1.676 m)    General: Patient appears well. No acute distress. Eyes: Sclera non-icteric. No conjunctival injection.  Respiratory: Lungs are CTA, bilaterally. No wheezes, rales, or rhonchi.  Cardiovascular: Regular rate and rhythm. No murmurs, rubs, or gallops.  Musculoskeletal: Full ROM intact in all four extremities. Normal muscle bulk and tone.    Neurological: Patient is alert and oriented x 3.  Skin: No lesions. No rashes.  Psych: Normal affect. Normal tone of voice.   Assessment & Plan:   See Encounters Tab for problem based charting.  See telephone encounter from 03/05/20 for updated assessment/plan.  Consider possibility of RTA if acidosis continues on future BMPs.  Patient seen with Dr. 03/07/20.  Criselda Peaches, MD 03/03/2020, 10:35 AM Pager: [phone]

## 2020-03-03 NOTE — Patient Instructions (Signed)
[name],  I am glad to hear that you are doing well despite your sugars being more elevated on the Pioglitazone.   Please continue taking your glipizide 10mg  daily Please STOP taking Pioglitazone 15mg  daily Please START taking Rybelsus 1 tablet first thing in the morning on an empty stomach, 30-60 minutes before eating, with water. I have given you a 30 day supply and your dose will be increased if you tolerate it well at your next visit.  It is very important that you check your sugars regularly, first thing in the morning and again 2 hours after each meal so that we can dose your medications appropriately.  It is very important that we get your cholesterol and blood pressure under good control to decrease your risk of stroke and heart attack.  Please START taking Rosuvastatin 20mg  daily  Please try taking your hydrochlorothiazide at home, 1 pill daily until your next appointment.  We can discuss adding Losartan at your next appointment if your pressures remain elevated.   Today, we will check your kidney function and urine studies.  I will call you with results as soon as they are available.   Please check to see if your FIT testing at home has expired. If it has, we can get you a new test kit.   Please schedule an appointment for follow up of your sugars and blood pressure in 1 month and don't hesitate to call sooner with any questions or concerns: [phone].  Thank you and take care,  Dr. Konrad Penta   High Cholesterol  High cholesterol is a condition in which the blood has high levels of a white, waxy, fat-like substance (cholesterol). The human body needs small amounts of cholesterol. The liver makes all the cholesterol that the body needs. Extra (excess) cholesterol comes from the food that we eat. Cholesterol is carried from the liver by the blood through the blood vessels. If you have high cholesterol, deposits (plaques) may build up on the walls of your blood vessels  (arteries). Plaques make the arteries narrower and stiffer. Cholesterol plaques increase your risk for heart attack and stroke. Work with your health care provider to keep your cholesterol levels in a healthy range. What increases the risk? This condition is more likely to develop in people who:  Eat foods that are high in animal fat (saturated fat) or cholesterol.  Are overweight.  Are not getting enough exercise.  Have a family history of high cholesterol. What are the signs or symptoms? There are no symptoms of this condition. How is this diagnosed? This condition may be diagnosed from the results of a blood test.  If you are older than age 3, your health care provider may check your cholesterol every 4-6 years.  You may be checked more often if you already have high cholesterol or other risk factors for heart disease. The blood test for cholesterol measures:  "Bad" cholesterol (LDL cholesterol). This is the main type of cholesterol that causes heart disease. The desired level for LDL is less than 100.  "Good" cholesterol (HDL cholesterol). This type helps to protect against heart disease by cleaning the arteries and carrying the LDL away. The desired level for HDL is 60 or higher.  Triglycerides. These are fats that the body can store or burn for energy. The desired number for triglycerides is lower than 150.  Total cholesterol. This is a measure of the total amount of cholesterol in your blood, including LDL cholesterol, HDL cholesterol, and triglycerides. A  healthy number is less than 200. How is this treated? This condition is treated with diet changes, lifestyle changes, and medicines. Diet changes  This may include eating more whole grains, fruits, vegetables, nuts, and fish.  This may also include cutting back on red meat and foods that have a lot of added sugar. Lifestyle changes  Changes may include getting at least 40 minutes of aerobic exercise 3 times a week.  Aerobic exercises include walking, biking, and swimming. Aerobic exercise along with a healthy diet can help you maintain a healthy weight.  Changes may also include quitting smoking. Medicines  Medicines are usually given if diet and lifestyle changes have failed to reduce your cholesterol to healthy levels.  Your health care provider may prescribe a statin medicine. Statin medicines have been shown to reduce cholesterol, which can reduce the risk of heart disease. Follow these instructions at home: Eating and drinking If told by your health care provider:  Eat chicken (without skin), fish, veal, shellfish, ground [place] breast, and round or loin cuts of red meat.  Do not eat fried foods or fatty meats, such as hot dogs and salami.  Eat plenty of fruits, such as apples.  Eat plenty of vegetables, such as broccoli, potatoes, and carrots.  Eat beans, peas, and lentils.  Eat grains such as barley, rice, couscous, and bulgur wheat.  Eat pasta without cream sauces.  Use skim or nonfat milk, and eat low-fat or nonfat yogurt and cheeses.  Do not eat or drink whole milk, cream, ice cream, egg yolks, or hard cheeses.  Do not eat stick margarine or tub margarines that contain trans fats (also called partially hydrogenated oils).  Do not eat saturated tropical oils, such as coconut oil and palm oil.  Do not eat cakes, cookies, crackers, or other baked goods that contain trans fats.  General instructions  Exercise as directed by your health care provider. Increase your activity level with activities such as gardening, walking, and taking the stairs.  Take over-the-counter and prescription medicines only as told by your health care provider.  Do not use any products that contain nicotine or tobacco, such as cigarettes and e-cigarettes. If you need help quitting, ask your health care provider.  Keep all follow-up visits as told by your health care provider. This is important. Contact  a health care provider if:  You are struggling to maintain a healthy diet or weight.  You need help to start on an exercise program.  You need help to stop smoking. Get help right away if:  You have chest pain.  You have trouble breathing. This information is not intended to replace advice given to you by your health care provider. Make sure you discuss any questions you have with your health care provider. Document Revised: 04/28/2017 Document Reviewed: 10/24/2015 Elsevier Patient Education  [place].

## 2020-03-03 NOTE — Assessment & Plan Note (Addendum)
Last hemoglobin A1c was 9.2 on 02/12/20. Labs last visit showed euglycemic DKA with glucose 174, anion gap 19, bicarb 20, and ketonuria and glucosuria. He stopped taking Empagliflozin 10mg  daily and started Pioglitazone 15mg  daily. He continued taking Glipizide 10mg  nightly. He notes worsening hyperglycemia since these changes were made with infrequent readings ranging from 104 to 358, 83% above target, 17% at target, and 0 hypoglycemic episodes.  - Check BMP - Check urine microalbumin to creatinine ratio - Continue Glipizide 10mg  nightly - Start Rybelsus 3mg  daily in the mornings, 30 minutes before breakfast [patient given free sample]. - Encouraged regular CBG monitoring (fasting and 2 hours after each meal) - Will hold off on meetings with diabetes educator until patient is more receptive to therapy

## 2020-03-03 NOTE — Assessment & Plan Note (Signed)
Patient has a long-standing history of mild asymptomatic hypercalcemia with inappropriately elevated PTH in 2018. May represent FHH vs. Hyperparathyroidism. - Check urine calcium:creatinine ratio

## 2020-03-03 NOTE — Assessment & Plan Note (Signed)
FIT testing had been ordered for patient in the past. Patient states he would prefer to check whether this has expired or not and is not interested in new kit or colonoscopy referral today.  - Will continue to discuss need for colon cancer screening

## 2020-03-04 LAB — CALCIUM, URINE, RANDOM: Calcium, Urine: 16.3 mg/dL

## 2020-03-04 LAB — BMP8+ANION GAP
Anion Gap: 18 mmol/L (ref 10.0–18.0)
BUN/Creatinine Ratio: 12 (ref 9–20)
BUN: 12 mg/dL (ref 6–24)
CO2: 17 mmol/L — ABNORMAL LOW (ref 20–29)
Calcium: 10.9 mg/dL — ABNORMAL HIGH (ref 8.7–10.2)
Chloride: 101 mmol/L (ref 96–106)
Creatinine, Ser: 0.99 mg/dL (ref 0.76–1.27)
GFR calc Af Amer: 96 mL/min/{1.73_m2} (ref >59)
GFR calc non Af Amer: 83 mL/min/{1.73_m2} (ref >59)
Glucose: 279 mg/dL — ABNORMAL HIGH (ref 65–99)
Potassium: 4.6 mmol/L (ref 3.5–5.2)
Sodium: 136 mmol/L (ref 134–144)

## 2020-03-04 LAB — MICROALBUMIN / CREATININE URINE RATIO
Creatinine, Urine: 88.9 mg/dL
Microalb/Creat Ratio: 4 mg/g creat (ref 0–29)
Microalbumin, Urine: 3.4 ug/mL

## 2020-03-05 ENCOUNTER — Telehealth: Payer: Self-pay | Admitting: Student

## 2020-03-05 NOTE — Telephone Encounter (Signed)
Discussed with [name] that his kidney function has improved and he is not spilling significant protein in his urine. He continues to have acidosis although he remains asymptomatic without severely elevated sugars. Discussed this is more likely due to Jardiance side effect and less likely true DKA. Discussed that his calcium to creatinine ratio of 0.016 places him right at border of FHH vs. HPTH. Patient is tolerating Rybelsus well. He says he will start taking HCTZ that he has at home for blood pressure. He says his FIT testing at home has expired. I informed him to call to schedule an appointment before the end of November to follow up his BMP and order a new FIT test and calcium studies can be reassessed in January 2022. He has no questions or concerns.  Glenford Bayley, PGY1 Internal Medicine [phone]

## 2020-03-09 NOTE — Progress Notes (Signed)
Internal Medicine Clinic Attending  I saw and evaluated the patient.  I personally confirmed the key portions of the history and exam documented by Dr. Speakman and I reviewed pertinent patient test results.  The assessment, diagnosis, and plan were formulated together and I agree with the documentation in the resident's note.  

## 2020-03-10 DIAGNOSIS — Z20828 Contact with and (suspected) exposure to other viral communicable diseases: Secondary | ICD-10-CM | POA: Diagnosis not present

## 2020-03-25 MED FILL — GLIPIZIDE ER 10 MG TB24: 10 | 90 days supply | Qty: 90 | Fill #2

## 2020-04-06 ENCOUNTER — Encounter: Payer: Self-pay | Admitting: Student

## 2020-04-06 ENCOUNTER — Other Ambulatory Visit: Payer: Self-pay

## 2020-04-06 ENCOUNTER — Ambulatory Visit (INDEPENDENT_AMBULATORY_CARE_PROVIDER_SITE_OTHER): Payer: 59 | Admitting: Student

## 2020-04-06 DIAGNOSIS — E1165 Type 2 diabetes mellitus with hyperglycemia: Secondary | ICD-10-CM

## 2020-04-06 DIAGNOSIS — I10 Essential (primary) hypertension: Secondary | ICD-10-CM | POA: Diagnosis not present

## 2020-04-06 MED ORDER — RYBELSUS 7 MG PO TABS: 7.0000 mg | ORAL_TABLET | Freq: Every day | ORAL | 3 refills | Status: DC

## 2020-04-06 NOTE — Patient Instructions (Addendum)
Thank you, Gregory Sosa for allowing [place] to provide your care today. Today we discussed your blood pressure and high blood sugars.   Please take your hydrochlorothiazide!   Please continue to take your glipizide and we have written you for a prescription to increase your rybelsus to 7 mg.   I have ordered the following labs for you:  Lab Orders  No laboratory test(s) ordered today     Referrals ordered today:   Referral Orders  No referral(s) requested today     I have ordered the following medication/changed the following medications:   Stop the following medications: There are no discontinued medications.   Start the following medications: Meds ordered this encounter  Medications  . Semaglutide (RYBELSUS) 7 MG TABS    Sig: Take 7 mg by mouth daily.    Dispense:  90 tablet    Refill:  3     Follow up: 1 month    Remember: Please take your medications as prescribed! If you have any complaints or concerns, please call our office.   Should you have any questions or concerns please call the internal medicine clinic at [phone].     Thalia Bloodgood, D.O. [facility]   Diabetic Ketoacidosis Diabetic ketoacidosis is a serious complication of diabetes. This condition develops when there is not enough insulin in the body. Insulin is an hormone that regulates blood sugar levels in the body. Normally, insulin allows glucose to enter the cells in the body. The cells break down glucose for energy. Without enough insulin, the body cannot break down glucose, so it breaks down fats instead. This leads to high blood glucose levels in the body and the production of acids that are called ketones. Ketones are poisonous at high levels. If diabetic ketoacidosis is not treated, it can cause severe dehydration and can lead to a coma or death. What are the causes? This condition develops when a lack of insulin causes the body to break down fats instead of  glucose. This may be triggered by:  Stress on the body. This stress is brought on by an illness.  Infection.  Medicines that raise blood glucose levels.  Not taking diabetes medicine.  New onset of type 1 diabetes mellitus. What are the signs or symptoms? Symptoms of this condition include:  Fatigue.  Weight loss.  Excessive thirst.  Light-headedness.  Fruity or sweet-smelling breath.  Excessive urination.  Vision changes.  Confusion or irritability.  Nausea.  Vomiting.  Rapid breathing.  Abdominal pain.  Feeling flushed. How is this diagnosed? This condition is diagnosed based on your medical history, a physical exam, and blood tests. You may also have a urine test to check for ketones. How is this treated? This condition may be treated with:  Fluid replacement. This may be done to correct dehydration.  Insulin injections. These may be given through the skin or through an IV tube.  Electrolyte replacement. Electrolytes are minerals in your blood. Electrolytes such as potassium and sodium may be given in pill form or through an IV tube.  Antibiotic medicines. These may be prescribed if your condition was caused by an infection. Diabetic ketoacidosis is a serious medical condition. You may need emergency treatment in the hospital to monitor your condition. Follow these instructions at home: Eating and drinking  Drink enough fluids to keep your urine clear or pale yellow.  If you are not able to eat, drink clear fluids in small amounts as you are able. Clear fluids  include water, ice chips, fruit juice with water added (diluted), and low-calorie sports drinks. You may also have sugar-free jello or popsicles.  If you are able to eat, follow your usual diet and drink sugar-free liquids, such as water. Medicines  Take over-the-counter and prescription medicines only as told by your health care provider.  Continue to take insulin and other diabetes medicines  as told by your health care provider.  If you were prescribed an antibiotic, take it as told by your health care provider. Do not stop taking the antibiotic even if you start to feel better. General instructions   Check your urine for ketones when you are ill and as told by your health care provider. ? If your blood glucose is 240 mg/dL (53.6 mmol/L) or higher, check your urine ketones every 4-6 hours.  Check your blood glucose every day, as often as told by your health care provider. ? If your blood glucose is high, drink plenty of fluids. This helps to flush out ketones. ? If your blood glucose is above your target for 2 tests in a row, contact your health care provider.  Carry a medical alert card or wear medical alert jewelry that says that you have diabetes.  Rest and exercise only as told by your health care provider. Do not exercise when your blood glucose is high and you have ketones in your urine.  If you get sick, call your health care provider and begin treatment quickly. Your body often needs extra insulin to fight an illness. Check your blood glucose every 4-6 hours when you are sick.  Keep all follow-up visits as told by your health care provider. This is important. Contact a health care provider if:  Your blood glucose level is higher than 240 mg/dL (14.4 mmol/L) for 2 days in a row.  You have moderate or large ketones in your urine.  You have a fever.  You cannot eat or drink without vomiting.  You have been vomiting for more than 2 hours.  You continue to have symptoms of diabetic ketoacidosis.  You develop new symptoms. Get help right away if:  Your blood glucose monitor reads "high" even when you are taking insulin.  You faint.  You have chest pain.  You have trouble breathing.  You have sudden trouble speaking or swallowing.  You have vomiting or diarrhea that gets worse after 3 hours.  You are unable to stay awake.  You have trouble  thinking.  You are severely dehydrated. Symptoms of severe dehydration include: ? Extreme thirst. ? Dry mouth. ? Rapid breathing. These symptoms may represent a serious problem that is an emergency. Do not wait to see if the symptoms will go away. Get medical help right away. Call your local emergency services (911 in the [place] Do not drive yourself to the hospital. Summary  Diabetic ketoacidosis is a serious complication of diabetes. This condition develops when there is not enough insulin in the body.  This condition is diagnosed based on your medical history, a physical exam, and blood tests. You may also have a urine test to check for ketones.  Diabetic ketoacidosis is a serious medical condition. You may need emergency treatment in the hospital to monitor your condition.  Contact your health care provider if your blood glucose is higher than 240 mg/dl for 2 days in a row or if you have moderate or large ketones in your urine. This information is not intended to replace advice given to you by your  health care provider. Make sure you discuss any questions you have with your health care provider. Document Revised: 06/10/2016 Document Reviewed: 05/30/2016 Elsevier Patient Education  2020 [employer].

## 2020-04-06 NOTE — Assessment & Plan Note (Addendum)
Assessment: Lab Results  Component Value Date   HGBA1C 9.2 (A) 02/12/2020   Patient states he has been compliant with his rybelsus 3 mg sample given at prior visit and glipizide. Notes he completed rybeslus sample. After checking his sugars daily, he does not believe that the rybelsus is working and is requesting to switch to invokana.   Upon chart review it seems as though the patient had failed multiple diabetes medications in the past; metformin had GI side effects. Januvia was attempted in the past, but patient did not believe it improved his glucose levels. It does not appear the [place] was taken consistently per prior A/P's. The patient was then switched to invokana. The patient did well on invokana, however, it appears his last two BM8's revealed euglycemic DKA so SGLT-2's were discontinued. Also patient switched from [employer] to [employer] and it does not appear as though invokana was covered by his insurance. Pioglitazone was started, unsure as to why this was discontinued.   The patient continued to want to be prescribed invokana despite explaining that it caused euglycemic DKA. The patient then voiced that he was unaware of euglycemic DKA in the past and that he felt fine on the medication without any symptoms of DKA. The mechanism and severity of this side effect was explained by myself as well as by Dr. Heide Spark. It was further explained that because he had this side effect, it would be medically irresponsible for either of [place] to prescribe this medication to him.   The current plan is to continue patient on rybelsus, but titrate up to 7 mg. If his glucose levels do not improve after 1 month on 7 mg, consider titrating up to max dose of 14 mg. Continue glipizide at this time.  Plan: - Rybelsus increased to 7 mg daily - Continue glipizide 10 mg 24 hr tab - Follow up in 1 month to assess patient's ability to tolerate rybelsus - Repeat A1c in 3 months

## 2020-04-06 NOTE — Assessment & Plan Note (Addendum)
Assessment: BP Readings from Last 3 Encounters:  04/06/20 (!) 141/84  03/03/20 (!) 156/77  02/18/20 (!) 152/90   Patient with current anti-hypertensive regimen of HCTZ 12.5 mg daily. He states non-adherence with this and states he will start taking the medication. From prior A/P's it appear patient has stated at those times that he was not taking his HCTZ. He endorses checking his blood pressure at home and states they are not too high, ranging from 140's-130's systolically.   When enquired about the reason why he does not take his HCTZ he stated he had no reason. States he will start taking it  Plan: - Continue HCTZ 12.5 daily - If patient adherent to regimen and still has hypertension, consider starting ARB.  - Patient instructed to follow up in 1 month for blood pressure recheck

## 2020-04-06 NOTE — Progress Notes (Signed)
CC: hyperglycemia  HPI:  [name] is a 58 y.o. male with a past medical history stated below and presents today for hyperglycemia. Please see problem based assessment and plan for additional details.  Past Medical History:  Diagnosis Date  . Diabetes mellitus type 2, uncontrolled ([place]) 03/23/2011  . Dyslipidemia 03/23/2011   21.5% 10-year risk of heart disease or stroke.     . Elevated PSA   . Hypercalcemia   . Hypertension   . Vitamin D deficiency     Current Outpatient Medications on File Prior to Visit  Medication Sig Dispense Refill  . Blood Glucose Monitoring Suppl (ONE TOUCH ULTRA SYSTEM KIT) W/DEVICE KIT Use to check sugar once to twice a day. Dx code: 250.00. 1 each 0  . cholecalciferol (VITAMIN D3) 25 MCG (1000 UNIT) tablet Take 1,000 Units by mouth daily.    [name] glipiZIDE (GLUCOTROL XL) 10 MG 24 hr tablet Take 1 tablet (10 mg total) by mouth daily with breakfast. 90 tablet 1  . glucose blood (BAYER CONTOUR NEXT TEST) test strip Use as instructed 100 each 12  . glucose blood (ONE TOUCH TEST STRIPS) test strip Use to check sugar once to twice a day. Dx code: 250.00. 100 each 12  . hydrochlorothiazide (HYDRODIURIL) 12.5 MG tablet Take 12.5 mg by mouth daily.    . hydrOXYzine (ATARAX/VISTARIL) 25 MG tablet Take 2 tablets (50 mg total) by mouth 2 (two) times daily as needed (allergic reaction). 20 tablet 0  . Lancets (ONETOUCH ULTRASOFT) lancets Use to check sugar once to twice a day. Dx code: 250.00. 100 each 12  . rosuvastatin (CRESTOR) 20 MG tablet Take 1 tablet (20 mg total) by mouth daily. 30 tablet 1   No current facility-administered medications on file prior to visit.    Family History  Problem Relation Age of Onset  . Heart disease Father     Social History   Socioeconomic History  . Marital status: Married    Spouse name: Not on file  . Number of children: Not on file  . Years of education: 35  . Highest education level: Not on file  Occupational  History  . Not on file  Tobacco Use  . Smoking status: Former Smoker    Packs/day: 0.50    Types: Cigarettes    Quit date: 02/06/2013    Years since quitting: 7.1  . Smokeless tobacco: Never Used  Vaping Use  . Vaping Use: Never used  Substance and Sexual Activity  . Alcohol use: Yes    Comment: Wine  . Drug use: Never  . Sexual activity: Yes  Other Topics Concern  . Not on file  Social History Narrative  . Not on file   Social Determinants of Health   Financial Resource Strain:   . Difficulty of Paying Living Expenses: Not on file  Food Insecurity:   . Worried About Charity fundraiser in the Last Year: Not on file  . Ran Out of Food in the Last Year: Not on file  Transportation Needs:   . Lack of Transportation (Medical): Not on file  . Lack of Transportation (Non-Medical): Not on file  Physical Activity:   . Days of Exercise per Week: Not on file  . Minutes of Exercise per Session: Not on file  Stress:   . Feeling of Stress : Not on file  Social Connections:   . Frequency of Communication with Friends and Family: Not on file  . Frequency of Social Gatherings  with Friends and Family: Not on file  . Attends Religious Services: Not on file  . Active Member of Clubs or Organizations: Not on file  . Attends Archivist Meetings: Not on file  . Marital Status: Not on file  Intimate Partner Violence:   . Fear of Current or Ex-Partner: Not on file  . Emotionally Abused: Not on file  . Physically Abused: Not on file  . Sexually Abused: Not on file    Review of Systems: ROS negative except for what is noted on the assessment and plan.  Vitals:   04/06/20 1345 04/06/20 1349  BP: (!) 151/79 (!) 141/84  Pulse: 80 78  Temp: 97.6 F (36.4 C)   TempSrc: Oral   SpO2: 97%   Weight: 178 lb 9.6 oz (81 kg)   Height: 5' 6"  (1.676 m)      Physical Exam: Physical Exam Vitals and nursing note reviewed.  Constitutional:      General: He is not in acute distress.     Appearance: Normal appearance. He is not ill-appearing, toxic-appearing or diaphoretic.  HENT:     Head: Normocephalic and atraumatic.  Cardiovascular:     Rate and Rhythm: Normal rate.  Pulmonary:     Effort: Pulmonary effort is normal. No respiratory distress.  Neurological:     General: No focal deficit present.     Mental Status: He is alert and oriented to person, place, and time. Mental status is at baseline.  Psychiatric:        Mood and Affect: Mood normal.        Behavior: Behavior normal.      Assessment & Plan:   See Encounters Tab for problem based charting.  Patient seen with Dr. Caffie Damme, D.O. [facility], PGY-1 Pager: [phone], Phone: [phone] Date 04/06/2020 Time 6:59 PM

## 2020-04-08 NOTE — Progress Notes (Signed)
Internal Medicine Clinic Attending  I saw and evaluated the patient.  I personally confirmed the key portions of the history and exam documented by Dr. Katsadouros and I reviewed pertinent patient test results.  The assessment, diagnosis, and plan were formulated together and I agree with the documentation in the resident's note.  

## 2020-04-15 NOTE — Addendum Note (Signed)
Addended by: Bufford Spikes on: 04/15/2020 11:49 AM   Modules accepted: Orders

## 2020-05-13 ENCOUNTER — Other Ambulatory Visit: Payer: Self-pay

## 2020-05-13 ENCOUNTER — Other Ambulatory Visit: Payer: Self-pay | Admitting: Student

## 2020-05-13 DIAGNOSIS — E785 Hyperlipidemia, unspecified: Secondary | ICD-10-CM

## 2020-05-13 MED ORDER — ROSUVASTATIN CALCIUM 20 MG PO TABS: 20.0000 mg | ORAL_TABLET | Freq: Every day | ORAL | 1 refills | Status: DC

## 2020-05-13 NOTE — Telephone Encounter (Signed)
Need refill on rosuvastatin (CRESTOR) 20 MG tablet(Expired)  ;pt contact [phone]   [facility]

## 2020-05-14 ENCOUNTER — Telehealth: Payer: Self-pay | Admitting: *Deleted

## 2020-05-14 NOTE — Telephone Encounter (Addendum)
Call to [facility] for PA for Rybelsus 7 mg tablets.  Information was given.  Approved 05/14/2020 thru 05/14/2021. PA [id number]. Angelina Ok, RN 05/14/2020 11:13 AM Fax from [facility] for Rybelsus was approved 05/14/2020 thru 05/14/2021.  Angelina Ok, RN 05/15/2020 8:50 AM.

## 2020-06-02 ENCOUNTER — Ambulatory Visit: Payer: 59 | Admitting: Student

## 2020-06-02 ENCOUNTER — Other Ambulatory Visit: Payer: Self-pay | Admitting: Student

## 2020-06-02 ENCOUNTER — Other Ambulatory Visit: Payer: Self-pay

## 2020-06-02 ENCOUNTER — Encounter: Payer: Self-pay | Admitting: Student

## 2020-06-02 VITALS — BP 151/84 | HR 72 | Temp 97.7°F | Ht 66.0 in | Wt 179.8 lb

## 2020-06-02 DIAGNOSIS — E785 Hyperlipidemia, unspecified: Secondary | ICD-10-CM

## 2020-06-02 DIAGNOSIS — I10 Essential (primary) hypertension: Secondary | ICD-10-CM | POA: Diagnosis not present

## 2020-06-02 DIAGNOSIS — E1165 Type 2 diabetes mellitus with hyperglycemia: Secondary | ICD-10-CM | POA: Diagnosis not present

## 2020-06-02 DIAGNOSIS — E559 Vitamin D deficiency, unspecified: Secondary | ICD-10-CM | POA: Diagnosis not present

## 2020-06-02 DIAGNOSIS — E782 Mixed hyperlipidemia: Secondary | ICD-10-CM

## 2020-06-02 DIAGNOSIS — R972 Elevated prostate specific antigen [PSA]: Secondary | ICD-10-CM

## 2020-06-02 LAB — POCT GLYCOSYLATED HEMOGLOBIN (HGB A1C): Hemoglobin A1C: 11.6 % — AB (ref 4.0–5.6)

## 2020-06-02 LAB — GLUCOSE, CAPILLARY: Glucose-Capillary: 253 mg/dL — ABNORMAL HIGH (ref 70–99)

## 2020-06-02 MED ORDER — GLIPIZIDE ER 10 MG PO TB24: 10.0000 mg | ORAL_TABLET | Freq: Every day | ORAL | 1 refills | Status: DC

## 2020-06-02 MED ORDER — HYDROCHLOROTHIAZIDE 12.5 MG PO TABS
12.5000 mg | ORAL_TABLET | Freq: Every day | ORAL | 1 refills | Status: DC
Start: 2020-06-02 — End: 2020-06-02

## 2020-06-02 MED ORDER — ROSUVASTATIN CALCIUM 20 MG PO TABS: 20.0000 mg | ORAL_TABLET | Freq: Every day | ORAL | 1 refills | Status: DC

## 2020-06-02 MED ORDER — HYDROCHLOROTHIAZIDE 12.5 MG PO TABS: 12.5000 mg | ORAL_TABLET | Freq: Every day | ORAL | 1 refills | Status: DC

## 2020-06-02 MED ORDER — GLIPIZIDE ER 10 MG PO TB24
10.0000 mg | ORAL_TABLET | Freq: Every day | ORAL | 1 refills | Status: DC
Start: 2020-06-02 — End: 2020-06-02

## 2020-06-02 NOTE — Progress Notes (Signed)
   CC: Hyperglycemia  HPI:  [name] is a 60 y.o. gentleman w/ PMHx notable for uncontrolled NIDDM type II, HTN, HLD, and recent vitamin D deficiency, presenting for routine follow up, stating that his blood sugars continue to remain elevated in the 200's at home. Please see problem-based assessment and plan for full details.   Past Medical History:  Diagnosis Date  . Diabetes mellitus type 2, uncontrolled ([facility]) 03/23/2011  . Dyslipidemia 03/23/2011   21.5% 10-year risk of heart disease or stroke.     . Elevated PSA   . Hypercalcemia   . Hypertension   . Vitamin D deficiency    Review of Systems:  Ten point ROS otherwise negative except as noted in assessment / plan.   Physical Exam:  Vitals:   06/02/20 0920 06/02/20 1020  BP: (!) 152/80 (!) 151/84  Pulse: 72   Temp: 97.7 F (36.5 C)   TempSrc: Oral   SpO2: 97%   Weight: 179 lb 12.8 oz (81.6 kg)   Height: 5\' 6"  (1.676 m)    General: Patient appears well. No acute distress. Eyes: Sclera non-icteric. No conjunctival injection.  HENT: MMM. No nasal discharge. Respiratory: Lungs are CTA, bilaterally. No wheezes, rales, or rhonchi.  Cardiovascular: Regular rate and rhythm. No murmurs, rubs, or gallops. No lower extremity edema. Abdominal: Soft and non-tender to palpation. Bowel sounds intact. Neurological: Alert and oriented x 3.  Musculoskeletal: Normal muscle bulk and tone. Normal gait. Skin: No lesions. No rashes.  Psych: Normal affect. Pleasant and cooperative.    Assessment & Plan:   See Encounters Tab for problem based charting.  Patient discussed with Dr. .  Antony Contras, MD 06/03/2020, 9:06 AM Pager: [phone]

## 2020-06-02 NOTE — Patient Instructions (Addendum)
Gregory Sosa,   Today, your blood pressure was found to be elevated. Our goal for you is <120/80. You would greatly benefit from taking HCTZ once daily. This medication can also protect your kidneys in the long run from diabetes.   Your cholesterol was significantly elevated in October. I recommend that you start rosuvastatin 20mg  daily, with a goal to increase this to a higher-intensity statin in about a month if you tolerate this without issue.   We will check your hemoglobin A1c today. If it remains elevated, I will increase your Rybelsus to 14mg  daily.   I will call you with results!  I have sent refills of all your medications to your pharmacy as outlined in your discharge paperwork. Please call with any questions or concerns and follow up with in 1 month to assess your sugars or 3 months if you hear otherwise over the phone.   Thank you and take care!  Dr.   High Cholesterol  High cholesterol is a condition in which the blood has high levels of a white, waxy substance similar to fat (cholesterol). The liver makes all the cholesterol that the body needs. The human body needs small amounts of cholesterol to help build cells. A person gets extra or excess cholesterol from the food that he or she eats. The blood carries cholesterol from the liver to the rest of the body. If you have high cholesterol, deposits (plaques) may build up on the walls of your arteries. Arteries are the blood vessels that carry blood away from your heart. These plaques make the arteries narrow and stiff. Cholesterol plaques increase your risk for heart attack and stroke. Work with your health care provider to keep your cholesterol levels in a healthy range. What increases the risk? The following factors may make you more likely to develop this condition:  Eating foods that are high in animal fat (saturated fat) or cholesterol.  Being overweight.  Not getting enough exercise.  A family history of  high cholesterol (familial hypercholesterolemia).  Use of tobacco products.  Having diabetes. What are the signs or symptoms? There are no symptoms of this condition. How is this diagnosed? This condition may be diagnosed based on the results of a blood test.  If you are older than 60 years of age, your health care provider may check your cholesterol levels every 4-6 years.  You may be checked more often if you have high cholesterol or other risk factors for heart disease. The blood test for cholesterol measures:  "Bad" cholesterol, or LDL cholesterol. This is the main type of cholesterol that causes heart disease. The desired level is less than 100 mg/dL.  "Good" cholesterol, or HDL cholesterol. HDL helps protect against heart disease by cleaning the arteries and carrying the LDL to the liver for processing. The desired level for HDL is 60 mg/dL or higher.  Triglycerides. These are fats that your body can store or burn for energy. The desired level is less than 150 mg/dL.  Total cholesterol. This measures the total amount of cholesterol in your blood and includes LDL, HDL, and triglycerides. The desired level is less than 200 mg/dL. How is this treated? This condition may be treated with:  Diet changes. You may be asked to eat foods that have more fiber and less saturated fats or added sugar.  Lifestyle changes. These may include regular exercise, maintaining a healthy weight, and quitting use of tobacco products.  Medicines. These are given when diet and lifestyle  changes have not worked. You may be prescribed a statin medicine to help lower your cholesterol levels. Follow these instructions at home: Eating and drinking  Eat a healthy, balanced diet. This diet includes: ? Daily servings of a variety of fresh, frozen, or canned fruits and vegetables. ? Daily servings of whole grain foods that are rich in fiber. ? Foods that are low in saturated fats and trans fats. These include  poultry and fish without skin, lean cuts of meat, and low-fat dairy products. ? A variety of fish, especially oily fish that contain omega-3 fatty acids. Aim to eat fish at least 2 times a week.  Avoid foods and drinks that have added sugar.  Use healthy cooking methods, such as roasting, grilling, broiling, baking, poaching, steaming, and stir-frying. Do not fry your food except for stir-frying.   Lifestyle  Get regular exercise. Aim to exercise for a total of 150 minutes a week. Increase your activity level by doing activities such as gardening, walking, and taking the stairs.  Do not use any products that contain nicotine or tobacco, such as cigarettes, e-cigarettes, and chewing tobacco. If you need help quitting, ask your health care provider.   General instructions  Take over-the-counter and prescription medicines only as told by your health care provider.  Keep all follow-up visits as told by your health care provider. This is important. Where to find more information  American Heart Association: www.heart.org  National Heart, Lung, and Blood Institute: PopSte[employer].is Contact a health care provider if:  You have trouble achieving or maintaining a healthy diet or weight.  You are starting an exercise program.  You are unable to stop smoking. Get help right away if:  You have chest pain.  You have trouble breathing.  You have any symptoms of a stroke. "BE FAST" is an easy way to remember the main warning signs of a stroke: ? B - Balance. Signs are dizziness, sudden trouble walking, or loss of balance. ? E - Eyes. Signs are trouble seeing or a sudden change in vision. ? F - Face. Signs are sudden weakness or numbness of the face, or the face or eyelid drooping on one side. ? A - Arms. Signs are weakness or numbness in an arm. This happens suddenly and usually on one side of the body. ? S - Speech. Signs are sudden trouble speaking, slurred speech, or trouble understanding  what people say. ? T - Time. Time to call emergency services. Write down what time symptoms started.  You have other signs of a stroke, such as: ? A sudden, severe headache with no known cause. ? Nausea or vomiting. ? Seizure. These symptoms may represent a serious problem that is an emergency. Do not wait to see if the symptoms will go away. Get medical help right away. Call your local emergency services (911 in the [place] Do not drive yourself to the hospital. Summary  Cholesterol plaques increase your risk for heart attack and stroke. Work with your health care provider to keep your cholesterol levels in a healthy range.  Eat a healthy, balanced diet, get regular exercise, and maintain a healthy weight.  Do not use any products that contain nicotine or tobacco, such as cigarettes, e-cigarettes, and chewing tobacco.  Get help right away if you have any symptoms of a stroke. This information is not intended to replace advice given to you by your health care provider. Make sure you discuss any questions you have with your health care provider. Document  Revised: 03/25/2019 Document Reviewed: 03/25/2019 Elsevier Patient Education  2021 [employer].

## 2020-06-03 ENCOUNTER — Telehealth: Payer: Self-pay | Admitting: Student

## 2020-06-03 LAB — VITAMIN D 25 HYDROXY (VIT D DEFICIENCY, FRACTURES): Vit D, 25-Hydroxy: 69.6 ng/mL (ref 30.0–100.0)

## 2020-06-03 MED ORDER — RYBELSUS 14 MG PO TABS: ORAL_TABLET | ORAL | 3 refills | Status: DC

## 2020-06-03 NOTE — Telephone Encounter (Signed)
Called to inform Mr. Rosiak that his vitamin D level returned to well within normal range. Given improvement and also hx of hypercalcemia (primary hyperPTH vs. FHH), instructed him to stop taking vitamin D and will need calcium reassessed at follow-up visit.   Offered 1 month follow up appointment given significant increase in hemoglobin A1c, although patient declines, stating he will pick up his Rybelsus 14mg  and other prescriptions and follow up with me in 3 month. Instructed to call if sugars well out of 120-180 range.   , PGY1 06/03/2020, 5:48 PM Pager: [phone]

## 2020-06-03 NOTE — Assessment & Plan Note (Signed)
BP Readings from Last 3 Encounters:  06/02/20 (!) 151/84  04/06/20 (!) 141/84  03/03/20 (!) 156/77   Blood pressure today remains elevated, unchanged on repeat check. Patient just took his first dose of HCTZ 12.5mg  today and had not taken this prescription previously.   - Continue HCTZ 12.5mg  daily

## 2020-06-03 NOTE — Assessment & Plan Note (Signed)
Patient expresses concern that his sugars have been elevated in the 200's when he has checked at home. He has been unable to take metformin due to GI side effects, SGLT2 inhibitors due to euglycemic DKA. He continues to take glipizide XL 10mg  daily and had his Rybelsus increased from 3 to 7mg  daily at the end of November. Hemoglobin A1c today 11.6%, significantly increased from 9.2 three months ago. Denies polydipsia, polyuria, abdominal pain, N/V, or symptoms of hypoglycemia.   - Increase rybelsus to 14mg  daily - Continue glipizide XL 10mg  daily  - Continue home glucose monitoring - Follow up visit in 1 month

## 2020-06-03 NOTE — Assessment & Plan Note (Signed)
Patient diagnosed with vitamin D deficiency in October 2021 although vitamin D levels normalized on OTC supplementation daily.   - Will discontinue vitamin D supplementation  - Repeat BMP next visit to assess response to HCTZ but also to monitor for worsening hypercalcemia [may indicated primary hyperparathyroidism over FHH].

## 2020-06-03 NOTE — Assessment & Plan Note (Signed)
In October, patient found to have significantly elevated cholesterol levels and was started on Rosuvastatin 20mg  daily. Would benefit from high intensity statin given significant ASCVD risk, although patient has not been taking his rosuvastatin.   - Discussed risk factors and significant ASCVD risk - Encouraged rosuvastatin 20mg  daily  - If compliant, will require repeat cholesterol levels at future visit

## 2020-06-03 NOTE — Assessment & Plan Note (Signed)
-   See "vitamin D deficiency"

## 2020-06-03 NOTE — Assessment & Plan Note (Signed)
-   Continues to prefer not to have PSA rechecked despite discussion of risk of prostate CA.

## 2020-06-08 NOTE — Progress Notes (Signed)
Internal Medicine Clinic Attending  Case discussed with Dr. Speakman  At the time of the visit.  We reviewed the resident's history and exam and pertinent patient test results.  I agree with the assessment, diagnosis, and plan of care documented in the resident's note.  

## 2020-08-10 ENCOUNTER — Other Ambulatory Visit (HOSPITAL_COMMUNITY): Payer: Self-pay

## 2020-08-10 MED FILL — Glipizide Tab ER 24HR 10 MG: ORAL | 30 days supply | Qty: 30 | Fill #0 | Status: AC

## 2020-09-11 ENCOUNTER — Other Ambulatory Visit (HOSPITAL_COMMUNITY): Payer: Self-pay

## 2020-09-11 MED FILL — Glipizide Tab ER 24HR 10 MG: ORAL | 30 days supply | Qty: 30 | Fill #1 | Status: AC

## 2020-10-01 ENCOUNTER — Encounter: Payer: Self-pay | Admitting: *Deleted

## 2020-10-13 ENCOUNTER — Other Ambulatory Visit: Payer: Self-pay | Admitting: *Deleted

## 2020-10-13 DIAGNOSIS — E1165 Type 2 diabetes mellitus with hyperglycemia: Secondary | ICD-10-CM

## 2020-10-13 MED ORDER — RYBELSUS 14 MG PO TABS: ORAL_TABLET | ORAL | 3 refills | Status: DC

## 2020-10-14 MED FILL — Glipizide Tab ER 24HR 10 MG: ORAL | 30 days supply | Qty: 30 | Fill #2 | Status: AC

## 2020-10-15 ENCOUNTER — Other Ambulatory Visit (HOSPITAL_COMMUNITY): Payer: Self-pay

## 2020-11-10 ENCOUNTER — Encounter: Payer: Self-pay | Admitting: *Deleted

## 2021-02-17 ENCOUNTER — Other Ambulatory Visit: Payer: Self-pay | Admitting: Student

## 2021-02-17 DIAGNOSIS — E1165 Type 2 diabetes mellitus with hyperglycemia: Secondary | ICD-10-CM

## 2021-06-15 ENCOUNTER — Telehealth: Payer: Self-pay

## 2021-06-15 NOTE — Telephone Encounter (Signed)
DECISION :    Message from Plan   Request Reference Number: [id number].    RYBELSUS TAB 14MG  is approved through 06/15/2022.   Your patient may now fill this prescription and it will be covered.   ( COPY SENT TO PHARMACY ALSO )

## 2021-06-15 NOTE — Telephone Encounter (Signed)
PA  for pt ( RYBELSUS 14MG  TAB ) came through via fax  was done  and submitted to cover my meds with office notes  and labs 06/02/20 .. awaiting approval or denial

## 2021-08-11 ENCOUNTER — Other Ambulatory Visit: Payer: Self-pay | Admitting: Student

## 2021-08-11 DIAGNOSIS — E1165 Type 2 diabetes mellitus with hyperglycemia: Secondary | ICD-10-CM

## 2021-10-25 ENCOUNTER — Other Ambulatory Visit: Payer: Self-pay

## 2021-10-25 DIAGNOSIS — E1165 Type 2 diabetes mellitus with hyperglycemia: Secondary | ICD-10-CM

## 2021-11-13 MED ORDER — GLIPIZIDE ER 10 MG PO TB24
10.0000 mg | ORAL_TABLET | Freq: Every day | ORAL | 1 refills | Status: DC
  Filled 2021-11-13: qty 30, 30d supply, fill #0

## 2021-11-15 ENCOUNTER — Other Ambulatory Visit (HOSPITAL_COMMUNITY): Payer: Self-pay

## 2021-11-24 ENCOUNTER — Other Ambulatory Visit (HOSPITAL_COMMUNITY): Payer: Self-pay

## 2021-12-04 ENCOUNTER — Ambulatory Visit
Admission: EM | Admit: 2021-12-04 | Discharge: 2021-12-04 | Disposition: A | Discharge status: Home / Self Care | Payer: 59 | Attending: Family Medicine | Admitting: Family Medicine

## 2021-12-04 ENCOUNTER — Encounter: Payer: Self-pay | Admitting: Emergency Medicine

## 2021-12-04 DIAGNOSIS — S61411A Laceration without foreign body of right hand, initial encounter: Secondary | ICD-10-CM | POA: Diagnosis not present

## 2021-12-04 NOTE — ED Triage Notes (Signed)
Pt is present with a laceration across the first three fingers on the left hand. Pt states that he cut his fingers last night

## 2021-12-06 NOTE — ED Provider Notes (Signed)
  [facility]   [id number] 12/04/21 Arrival Time: 1548  ASSESSMENT & PLAN:  1. Laceration of right hand without foreign body, initial encounter     Procedure: Laceration Repair Verbal consent obtained. Patient provided with risks and alternatives to the procedure. Wound copiously irrigated with NS then cleansed with betadine. Local anesthesia: Lidocaine 1% without epinephrine. Wound carefully explored. No foreign body, tendon injury, or nonviable tissue were noted. Using sterile technique, 3 interrupted 5-0 Prolene sutures were placed to reapproximate the wound. Procedure tolerated well. No complications. Minimal bleeding. Advised to look for and return for any signs of infection such as redness, swelling, discharge, or worsening pain. Return for suture removal in 7 days.  See AVS for d/c instructions.  Reviewed expectations re: course of current medical issues. Questions answered. Outlined signs and symptoms indicating need for more acute intervention. Patient verbalized understanding. After Visit Summary given.   SUBJECTIVE:  Gregory Sosa is a 61 y.o. male who presents with a laceration of R hand. Laceration across the first three fingers on the left hand. Approx 12-14 hours ago. Bleeding controlled. No strength or ROM loss. No extremity sensation changes or weakness. Did wash wounds well.  Td UTD: Yes.  OBJECTIVE:  Vitals:   12/04/21 1559  BP: (!) 145/87  Pulse: 76  Resp: 17  Temp: 98.5 F (36.9 C)  SpO2: 94%     General appearance: alert; no distress Skin: slightly curved lacerations at 2nd and 3rd MCP of R hand; FROM of all fingers with normal cap refill and distal sensation; no FB; no active bleeding; total laceration size 1.5 cm Psychological: alert and cooperative; normal mood and affect    Labs Reviewed - No data to display  No results found.  Allergies  Allergen Reactions   Lisinopril Cough   Metformin And Related Diarrhea and Nausea And Vomiting     Past Medical History:  Diagnosis Date   Diabetes mellitus type 2, uncontrolled 03/23/2011   Dyslipidemia 03/23/2011   21.5% 10-year risk of heart disease or stroke.      Elevated PSA    Hypercalcemia    Hypertension    Vitamin D deficiency    Social History   Socioeconomic History   Marital status: Married    Spouse name: Not on file   Number of children: Not on file   Years of education: 16   Highest education level: Not on file  Occupational History   Not on file  Tobacco Use   Smoking status: Former    Packs/day: 0.50    Types: Cigarettes    Quit date: 02/06/2013    Years since quitting: 8.8   Smokeless tobacco: Never  Vaping Use   Vaping Use: Never used  Substance and Sexual Activity   Alcohol use: Yes    Comment: Wine   Drug use: Never   Sexual activity: Yes  Other Topics Concern   Not on file  Social History Narrative   Not on file   Social Determinants of Health   Financial Resource Strain: Not on file  Food Insecurity: Not on file  Transportation Needs: Not on file  Physical Activity: Not on file  Stress: Not on file  Social Connections: Not on file          [place], MD 12/06/21 2119    Mardella Layman, MD 12/08/21 1325

## 2021-12-08 NOTE — ED Provider Notes (Deleted)
  [facility]  Addendum to note 12/04/2021.  Total laceration size repaired 3 cm.   Mardella Layman, MD 12/08/21 1324

## 2021-12-10 ENCOUNTER — Ambulatory Visit
Admission: EM | Admit: 2021-12-10 | Discharge: 2021-12-10 | Disposition: A | Discharge status: Home / Self Care | Payer: 59

## 2021-12-10 DIAGNOSIS — Z4802 Encounter for removal of sutures: Secondary | ICD-10-CM

## 2021-12-10 NOTE — ED Triage Notes (Signed)
Pt here for rn visit for suture removal. Sutures removed without complication. Education provided.

## 2022-01-24 ENCOUNTER — Other Ambulatory Visit: Payer: Self-pay | Admitting: Internal Medicine

## 2022-01-24 DIAGNOSIS — E1165 Type 2 diabetes mellitus with hyperglycemia: Secondary | ICD-10-CM

## 2022-01-28 ENCOUNTER — Other Ambulatory Visit: Payer: Self-pay

## 2022-01-28 DIAGNOSIS — E1165 Type 2 diabetes mellitus with hyperglycemia: Secondary | ICD-10-CM

## 2022-01-28 NOTE — Telephone Encounter (Signed)
Next appt scheduled 9/29 with PCP. 

## 2022-01-28 NOTE — Telephone Encounter (Signed)
Semaglutide (RYBELSUS) 14 MG TABS, refill request @ [facility] [phone] - [place], [address] AT [address].

## 2022-01-30 MED ORDER — RYBELSUS 14 MG PO TABS: ORAL_TABLET | ORAL | 3 refills | Status: DC

## 2022-02-04 ENCOUNTER — Other Ambulatory Visit: Payer: Self-pay | Admitting: Internal Medicine

## 2022-02-04 ENCOUNTER — Other Ambulatory Visit (HOSPITAL_COMMUNITY): Payer: Self-pay

## 2022-02-04 ENCOUNTER — Other Ambulatory Visit: Payer: Self-pay

## 2022-02-04 ENCOUNTER — Encounter: Payer: Self-pay | Admitting: Internal Medicine

## 2022-02-04 ENCOUNTER — Ambulatory Visit: Payer: 59 | Admitting: Internal Medicine

## 2022-02-04 VITALS — BP 152/89 | HR 86 | Temp 97.5°F | Ht 66.0 in | Wt 164.3 lb

## 2022-02-04 DIAGNOSIS — I10 Essential (primary) hypertension: Secondary | ICD-10-CM

## 2022-02-04 DIAGNOSIS — E1165 Type 2 diabetes mellitus with hyperglycemia: Secondary | ICD-10-CM | POA: Diagnosis not present

## 2022-02-04 DIAGNOSIS — E785 Hyperlipidemia, unspecified: Secondary | ICD-10-CM

## 2022-02-04 DIAGNOSIS — N529 Male erectile dysfunction, unspecified: Secondary | ICD-10-CM

## 2022-02-04 DIAGNOSIS — E782 Mixed hyperlipidemia: Secondary | ICD-10-CM | POA: Diagnosis not present

## 2022-02-04 DIAGNOSIS — E119 Type 2 diabetes mellitus without complications: Secondary | ICD-10-CM

## 2022-02-04 LAB — POCT GLYCOSYLATED HEMOGLOBIN (HGB A1C): Hemoglobin A1C: 11.6 % — AB (ref 4.0–5.6)

## 2022-02-04 LAB — GLUCOSE, CAPILLARY: Glucose-Capillary: 153 mg/dL — ABNORMAL HIGH (ref 70–99)

## 2022-02-04 MED ORDER — GLIPIZIDE ER 10 MG PO TB24: 10.0000 mg | ORAL_TABLET | Freq: Every day | ORAL | 1 refills | Status: DC

## 2022-02-04 MED ORDER — RYBELSUS 14 MG PO TABS: 14.0000 mg | ORAL_TABLET | Freq: Every day | ORAL | 3 refills | Status: DC

## 2022-02-04 MED ORDER — HYDROCHLOROTHIAZIDE 12.5 MG PO TABS
12.5000 mg | ORAL_TABLET | Freq: Every day | ORAL | 1 refills | Status: DC
  Filled 2022-02-04: qty 30, 30d supply, fill #0

## 2022-02-04 MED ORDER — ROSUVASTATIN CALCIUM 20 MG PO TABS
20.0000 mg | ORAL_TABLET | Freq: Every day | ORAL | 1 refills | Status: DC
  Filled 2022-08-18: qty 30, 30d supply, fill #0

## 2022-02-04 MED ORDER — GLIPIZIDE ER 10 MG PO TB24
10.0000 mg | ORAL_TABLET | Freq: Every day | ORAL | 1 refills | Status: DC
  Filled 2022-02-04: qty 30, 30d supply, fill #0

## 2022-02-04 MED ORDER — RYBELSUS 14 MG PO TABS
14.0000 mg | ORAL_TABLET | Freq: Every day | ORAL | 3 refills | Status: DC
  Filled 2022-02-04: qty 30, 30d supply, fill #0

## 2022-02-04 MED ORDER — HYDROCHLOROTHIAZIDE 12.5 MG PO TABS: 12.5000 mg | ORAL_TABLET | Freq: Every day | ORAL | 1 refills | Status: DC

## 2022-02-04 MED ORDER — ROSUVASTATIN CALCIUM 20 MG PO TABS
20.0000 mg | ORAL_TABLET | Freq: Every day | ORAL | 1 refills | Status: DC
  Filled 2022-02-04: qty 30, 30d supply, fill #0

## 2022-02-04 NOTE — Progress Notes (Unsigned)
   CC: routine check up/med refill  HPI:[name] is a 61 y.o. male who presents for evaluation of routine check up/med refill. Please see individual problem based A/P for details.  Patient reporting poor compliance with medicines. He states he has been out of rybelsus for 1 week and has not used prescribed glipizide for past 4 months. States while taking both rybelsus and glipizide CBG typically range 200-300, with rybelsus alone he has been in 250-300s. He has been using old Rx of jardiance with CBG around 170. Counseled him that this medication and others in it's class was discontinued due to his hx of euglycemic DKA. States that he does not eat many carbs nor doe she drink soda. Asymptomatic today. He has beenon Pioglitazone in the past but does not remember why it was stopped.    Depression, PHQ-9: Based on the patients  [place] Visit from 06/02/2020 in [facility]  PHQ-9 Total Score 0      score we have .  Past Medical History:  Diagnosis Date   Diabetes mellitus type 2, uncontrolled 03/23/2011   Dyslipidemia 03/23/2011   21.5% 10-year risk of heart disease or stroke.      Elevated PSA    Hypercalcemia    Hypertension    Vitamin D deficiency    Review of Systems:   Review of Systems  Constitutional: Negative.   Eyes: Negative.   Cardiovascular: Negative.   Genitourinary: Negative.   Neurological: Negative.      Physical Exam: Vitals:   02/04/22 1126  BP: (!) 152/89  Pulse: 86  Temp: (!) 97.5 F (36.4 C)  TempSrc: Oral  SpO2: 99%  Weight: 164 lb 4.8 oz (74.5 kg)  Height: 5\' 6"  (1.676 m)   General: NAD HEENT: Conjunctiva nl , antiicteric sclerae, moist mucous membranes, no exudate or erythema Cardiovascular: Normal rate, regular rhythm.  No murmurs, rubs, or gallops Pulmonary : Equal breath sounds, No wheezes, rales, or rhonchi Abdominal: soft, nontender,  bowel sounds present Ext: No edema in lower extremities, no  tenderness to palpation of lower extremities.   Assessment & Plan:   See Encounters Tab for problem based charting.  Diabetes is poorly controlled right now, likely due to medication non-adherence.  Will plan to have patient keep close log of blood sugars while taking both glipizide and rybelsus. Follow up in 1 month. IF CBGs remain elevated, will plan to restart Pioglitazone. Depending on his response to these medications, he may need to be started on insulins.  Unclear adherence to this medication, though suspect that he has not been adherent. Lipid panel has not shown improvement that would be expected with medication adherence. His 10 year ASCVD risk is 57% based off of current risk factors. Will counsel patient on the importance of adherence to this medication. He may require additional agents including Zetia to meet targets.  Will nee to perform genital exam prior to prescribing this medication. This can be done at his 1 month follow up appt.   Patient discussed with Dr.  Saverio Danker

## 2022-02-04 NOTE — Patient Instructions (Signed)
Dear Gregory Sosa,  Thank you for trusting [place] with your care today. We discussed your diabetes. We would like for you to take the rybelsus and glipizide. Please check you blood sugar every day. We want to see you back in 1 month to review this log. We may add on an additional oral medicine depending on what your blood sugar log shows [place].

## 2022-02-05 LAB — LIPID PANEL
Chol/HDL Ratio: 10.2 ratio — ABNORMAL HIGH (ref 0.0–5.0)
Cholesterol, Total: 296 mg/dL — ABNORMAL HIGH (ref 100–199)
HDL: 29 mg/dL — ABNORMAL LOW (ref >39)
LDL Chol Calc (NIH): 178 mg/dL — ABNORMAL HIGH (ref 0–99)
Triglycerides: 442 mg/dL — ABNORMAL HIGH (ref 0–149)
VLDL Cholesterol Cal: 89 mg/dL — ABNORMAL HIGH (ref 5–40)

## 2022-02-05 LAB — BMP8+ANION GAP
Anion Gap: 22 mmol/L — ABNORMAL HIGH (ref 10.0–18.0)
BUN/Creatinine Ratio: 15 (ref 10–24)
BUN: 14 mg/dL (ref 8–27)
CO2: 15 mmol/L — ABNORMAL LOW (ref 20–29)
Calcium: 10.9 mg/dL — ABNORMAL HIGH (ref 8.6–10.2)
Chloride: 97 mmol/L (ref 96–106)
Creatinine, Ser: 0.91 mg/dL (ref 0.76–1.27)
Glucose: 136 mg/dL — ABNORMAL HIGH (ref 70–99)
Potassium: 4.9 mmol/L (ref 3.5–5.2)
Sodium: 134 mmol/L (ref 134–144)
eGFR: 96 mL/min/{1.73_m2} (ref >59)

## 2022-02-06 ENCOUNTER — Other Ambulatory Visit (HOSPITAL_COMMUNITY): Payer: Self-pay

## 2022-02-06 ENCOUNTER — Encounter: Payer: Self-pay | Admitting: Internal Medicine

## 2022-02-06 DIAGNOSIS — N529 Male erectile dysfunction, unspecified: Secondary | ICD-10-CM | POA: Insufficient documentation

## 2022-02-06 NOTE — Assessment & Plan Note (Signed)
Patient reporting poor compliance with medicines. He states he has been out of rybelsus for 1 week and has not used prescribed glipizide for past 4 months. States while taking both rybelsus and glipizide CBG typically range 200-300, with rybelsus alone he has been in 250-300s. He has been using old Rx of jardiance with CBG around 170. Counseled him that this medication and others in it's class was discontinued due to his hx of euglycemic DKA. States that he does not eat many carbs nor doe she drink soda. Asymptomatic today. He has beenon Pioglitazone in the past but does not remember why it was stopped.   Diabetes is poorly controlled right now, likely due to medication non-adherence.  Will plan to have patient keep close log of blood sugars while taking both glipizide and rybelsus. Follow up in 1 month. IF CBGs remain elevated, will plan to restart Pioglitazone. Depending on his response to these medications, he may need to be started on insulins.

## 2022-02-06 NOTE — Assessment & Plan Note (Signed)
Patient prescribed Rosuvastatin 40mg .   Unclear adherence to this medication, though suspect that he has not been adherent. Lipid panel has not shown improvement that would be expected with medication adherence. His 10 year ASCVD risk is 57% based off of current risk factors. Will counsel patient on the importance of adherence to this medication. He may require additional agents including Zetia to meet targets.

## 2022-02-06 NOTE — Assessment & Plan Note (Signed)
Patient requesting Viagra prescription. Was unable to gather further pertitent history due to time constraints, however, upon chart review, he does not appear to have immediate contraindications to this medicine.   Will nee to perform genital exam prior to prescribing this medication. This can be done at his 1 month follow up appt.

## 2022-02-07 ENCOUNTER — Telehealth: Payer: Self-pay

## 2022-02-07 NOTE — Telephone Encounter (Signed)
Pt is requesting a call back ... He stated that at his last office visit his PCP was to send in some sildenafil  for him but it was not called in   .[name] The pharmacy pharmacy was to be [facility] [address]

## 2022-02-08 NOTE — Telephone Encounter (Signed)
Contacted patient regarding Rosuvastatin prescription. He had initially refused medication at pharmacy, but states that his wife later went back and filled his Rx.

## 2022-02-08 NOTE — Telephone Encounter (Signed)
Spoke with patient regarding Sildenafil Rx with him. Discussed that he will need to have genital exam prior to safe prescribing. This can be done at his next appointment. Patient is agreeable to this plan.

## 2022-02-08 NOTE — Telephone Encounter (Signed)
Second attempt

## 2022-02-08 NOTE — Progress Notes (Signed)
Internal Medicine Clinic Attending ° °Case discussed with Dr. Gawaluck  At the time of the visit.  We reviewed the resident’s history and exam and pertinent patient test results.  I agree with the assessment, diagnosis, and plan of care documented in the resident’s note.  °

## 2022-02-08 NOTE — Telephone Encounter (Signed)
Medication is not on current med list. LOV 02/04/22.

## 2022-03-07 ENCOUNTER — Ambulatory Visit: Payer: 59 | Admitting: Internal Medicine

## 2022-03-07 ENCOUNTER — Other Ambulatory Visit: Payer: Self-pay

## 2022-03-07 ENCOUNTER — Other Ambulatory Visit (HOSPITAL_COMMUNITY): Payer: Self-pay

## 2022-03-07 ENCOUNTER — Encounter: Payer: Self-pay | Admitting: Internal Medicine

## 2022-03-07 VITALS — BP 138/78 | HR 76 | Temp 97.5°F | Resp 24 | Ht 66.0 in | Wt 173.3 lb

## 2022-03-07 DIAGNOSIS — I10 Essential (primary) hypertension: Secondary | ICD-10-CM

## 2022-03-07 DIAGNOSIS — N529 Male erectile dysfunction, unspecified: Secondary | ICD-10-CM | POA: Diagnosis not present

## 2022-03-07 DIAGNOSIS — E119 Type 2 diabetes mellitus without complications: Secondary | ICD-10-CM

## 2022-03-07 DIAGNOSIS — Z794 Long term (current) use of insulin: Secondary | ICD-10-CM

## 2022-03-07 DIAGNOSIS — E782 Mixed hyperlipidemia: Secondary | ICD-10-CM

## 2022-03-07 DIAGNOSIS — Z87891 Personal history of nicotine dependence: Secondary | ICD-10-CM

## 2022-03-07 MED ORDER — PEN NEEDLES 32G X 4 MM MISC
3 refills | Status: DC
  Filled 2022-03-07: qty 100, 31d supply, fill #0
  Filled 2022-03-23 – 2022-04-18 (×2): qty 100, 31d supply, fill #1
  Filled 2022-09-12: qty 100, 31d supply, fill #2
  Filled 2022-12-26: qty 100, 31d supply, fill #3

## 2022-03-07 MED ORDER — SILDENAFIL CITRATE 50 MG PO TABS
50.0000 mg | ORAL_TABLET | ORAL | 1 refills | Status: DC | PRN
  Filled 2022-03-07: qty 5, 30d supply, fill #0
  Filled 2022-03-11: qty 5, 5d supply, fill #1
  Filled 2022-04-08: qty 5, 5d supply, fill #2
  Filled 2022-05-04: qty 20, 20d supply, fill #3

## 2022-03-07 MED ORDER — INSULIN GLARGINE 100 UNITS/ML SOLOSTAR PEN
10.0000 [IU] | PEN_INJECTOR | Freq: Every day | SUBCUTANEOUS | 3 refills | Status: DC
  Filled 2022-03-07: qty 3, 30d supply, fill #0

## 2022-03-07 MED ORDER — OLMESARTAN MEDOXOMIL 20 MG PO TABS
20.0000 mg | ORAL_TABLET | Freq: Every day | ORAL | 11 refills | Status: DC
  Filled 2022-03-07: qty 30, 30d supply, fill #0

## 2022-03-07 MED ORDER — INSULIN GLARGINE 100 UNIT/ML SOLOSTAR PEN
10.0000 [IU] | PEN_INJECTOR | Freq: Every evening | SUBCUTANEOUS | 11 refills | Status: DC
  Filled 2022-03-07: qty 3, 30d supply, fill #0

## 2022-03-07 NOTE — Assessment & Plan Note (Signed)
Patient reports non-adherence to rosuvastatin due to hesitations about the medication. Given patient's most recent lipid panel in 09/23 total cholesterol elevated at 296 and LDL elevated at 178, patient should continued to be counseled on the importance of reducing these levels particularly with his history of diabetes.  Plan -Counseled patient on the importance of taking rosuvastatin to reduce cholesterol, particularly in the setting of his diabetes to prevent cardiovascular complications -Encouraged heart-healthy diet -Could further discuss his hesitations of statins at follow-up appointment -Lipid panel in 6-8 weeks once patient starts taking statin (patient reports not taking)

## 2022-03-07 NOTE — Assessment & Plan Note (Addendum)
Since being seen in clinic last month his diabetes has remained poorly controlled. From his monitor log, his AM glucose has been 173-303 and PM glucose has been 211-282. His fasting glucose goal is <140.  He reports that he has not had issues taking his semaglutide or glipizide. He has not been experiencing symptoms for hyperglycemia. Given his glucose values, he will need further modifications to his medication regimen, starting with insulin.   Plan -Start insulin glargine injection 10 units daily -Patient given print-out information on Lantus -Discontinue glipizide  -Continue semaglutide 14 mg tablet daily, could consider switching to injectable GLP-1 for additional benefits of weight loss. -Appointment in 2 weeks to re-assess his blood glucose log and how the insulin has been going for him -Counseled patient on the importance of medication adherence and getting towards goal of glucose <785 to prevent complications -F/u microalbumin/cr urine ratio  Addendum: Urine microalbumin within normal limits

## 2022-03-07 NOTE — Assessment & Plan Note (Signed)
Patient reports experiencing symptoms of impotence. His Sexual Health Inventory for Men (SHIM) score is 12, which qualifies as mild-to-moderate ED. He has not taken anything in the past for this - he is interested in sildenafil prescription. His genital exam today was normal.  Plan -Start Viagra 50 mg as needed -Counseled patient on potential side effects, and to come to the ED if erection lasts 3 hours or longer

## 2022-03-07 NOTE — Progress Notes (Signed)
    Subjective:   Patient ID: Gregory Sosa male   DOB: 12/04/1960 61 y.o.   MRN: [id number]  HPI: [name] is a 61 y.o. man with past medical history of DM2 and hypertension who is here for 1-month follow-up for his diabetes management.  Diabetes: He reports that he has not had issues taking his semaglutide or glipizide. Since being seen in clinic last month, his AM glucose has been 173-303 and PM glucose has been 211-282 based off his monitor log.  ED: Patient reports experiencing symptoms of impotence. His SHIM score is 12. He has not taken anything in the past for this. He is interested in sidenafil prescription.  He reports he does not take some of his medications including his meds for HLD and HTN.   Review of Systems: Constitutional: No dizziness or changes in vision Respiratory: No shortness of breath or difficulty breathing Cardiovascular: No chest pain or palpitations Abdominal: No nausea, vomiting, or abdominal pain. No diarrhea or constipation. Genitourinary: No issues with urination  Past Medical History:  Diagnosis Date   Diabetes mellitus type 2, uncontrolled 03/23/2011   Dyslipidemia 03/23/2011   21.5% 10-year risk of heart disease or stroke.      Elevated PSA    Hypercalcemia    Hypertension    Vitamin D deficiency     Patient Active Problem List   Diagnosis Date Noted   Erectile dysfunction 02/06/2022   Vitamin D deficiency 03/03/2020   Acute pain of left shoulder 02/19/2019   Colon cancer screening 03/03/2017   Hypercalcemia, asymptomatic  10/13/2014   GERD (gastroesophageal reflux disease) 10/09/2012   Elevated PSA, between 10 and less than 20 ng/ml 08/02/2012   Health care maintenance 06/19/2012   H/O atypical chest pain 03/23/2011   Hypertension 03/23/2011   Diabetes (HCC) 03/23/2011   Hyperlipidemia 03/23/2011     Current Outpatient Medications  Medication Sig Dispense Refill   insulin glargine (LANTUS) 100 UNIT/ML Solostar Pen Inject 10  Units into the skin at bedtime. 3 mL 11   Insulin Pen Needle (PEN NEEDLES) 32G X 4 MM MISC Use new needle with each injection 100 each 3   olmesartan (BENICAR) 20 MG tablet Take 1 tablet (20 mg total) by mouth daily. 30 tablet 11   sildenafil (VIAGRA) 50 MG tablet Take 1 tablet (50 mg total) by mouth as needed for erectile dysfunction. 20 tablet 1   Blood Glucose Monitoring Suppl (ONE TOUCH ULTRA SYSTEM KIT) W/DEVICE KIT Use to check sugar once to twice a day. Dx code: 250.00. 1 each 0   cholecalciferol (VITAMIN D3) 25 MCG (1000 UNIT) tablet Take 1,000 Units by mouth daily.     glucose blood (BAYER CONTOUR NEXT TEST) test strip Use as instructed 100 each 12   glucose blood (ONE TOUCH TEST STRIPS) test strip Use to check sugar once to twice a day. Dx code: 250.00. 100 each 12   hydrochlorothiazide (HYDRODIURIL) 12.5 MG tablet Take 1 tablet (12.5 mg total) by mouth daily. 30 tablet 1   hydrOXYzine (ATARAX/VISTARIL) 25 MG tablet Take 2 tablets (50 mg total) by mouth 2 (two) times daily as needed (allergic reaction). 20 tablet 0   Lancets (ONETOUCH ULTRASOFT) lancets Use to check sugar once to twice a day. Dx code: 250.00. 100 each 12   rosuvastatin (CRESTOR) 20 MG tablet Take 1 tablet (20 mg total) by mouth daily. 30 tablet 1   Semaglutide (RYBELSUS) 14 MG TABS Take 1 tablet (14 mg total) by mouth daily   before breakfast. 30 tablet 3   No current facility-administered medications for this visit.    Objective:   Physical Exam: Vitals:   03/07/22 1320  BP: 138/78  Pulse: 76  Resp: (!) 24  Temp: (!) 97.5 F (36.4 C)  TempSrc: Oral  SpO2: 100%  Weight: 173 lb 4.8 oz (78.6 kg)  Height: 5' 6" (1.676 m)   Constitutional: pleasant, well appearing, in no acute distress HENT: mucous membranes moist Cardiovascular: regular rate with normal rhythm, no murmurs. Radial pulses 2+ bilaterally Pulmonary/Chest: normal work of breathing on room air, lungs clear to auscultation bilaterally Abdominal:  soft, non-tender, non-distended, bowel sounds present MSK: normal bulk and tone, no lower extremity edema Skin: warm and dry. Neurological: alert and answering questions appropriately. Psych: appropriate mood and affect   Assessment & Plan:   Diabetes (HCC)  Since being seen in clinic last month his diabetes has remained poorly controlled. From his monitor log, his AM glucose has been 173-303 and PM glucose has been 211-282. His fasting glucose goal is <140.  He reports that he has not had issues taking his semaglutide or glipizide. He has not been experiencing symptoms for hyperglycemia. Given his glucose values, he will need further modifications to his medication regimen, starting with insulin.   Plan -Start insulin glargine injection 10 units daily -Patient given print-out information on Lantus -Discontinue glipizide  -Continue semaglutide 14 mg tablet daily -Appointment in 2 weeks to re-assess his blood glucose log and how the insulin has been going for him -Counseled patient on the importance of medication adherence and getting towards goal of glucose <140 to prevent complications -F/u microalbumin/cr urine ratio  Erectile dysfunction Patient reports experiencing symptoms of impotence. His Sexual Health Inventory for Men (SHIM) score is 12, which qualifies as mild-to-moderate ED. He has not taken anything in the past for this - he is interested in sildenafil prescription. His genital exam today was normal.  Plan -Start Viagra 50 mg as needed -Counseled patient on potential side effects, and to come to the ED if erection lasts 3 hours or longer  Hyperlipidemia Patient reports non-adherence to rosuvastatin due to hesitations about the medication. Given patient's most recent lipid panel in 09/23 total cholesterol elevated at 296 and LDL elevated at 178, patient should continued to be counseled on the importance of reducing these levels particularly with his history of  diabetes.  Plan -Counseled patient on the importance of taking rosuvastatin to reduce cholesterol, particularly in the setting of his diabetes to prevent cardiovascular complications -Encouraged heart-healthy diet -Could further discuss his hesitations of statins at follow-up appointment -Lipid panel in 6-8 weeks once patient starts taking statin (patient reports not taking)   Hypertension Blood pressure today at 138/78. Not at goal of <120/80 given his risk factors. Patient reports poor compliance to BP medications.  Plan -Start olmesartan 20 mg daily  -Continue HCTZ 12.5 mg daily, although can consider discontinue if patient reports not taking at follow-up -BMP at 2 week follow-up   Gregory Sosa [employer], MS3 03/07/2022, 6:09 PM  

## 2022-03-07 NOTE — Assessment & Plan Note (Addendum)
Blood pressure today at 138/78. Not at goal of <120/80 given his risk factors. Patient reports poor compliance to BP medications.  Plan -Start olmesartan 20 mg daily  -Continue HCTZ 12.5 mg daily, although can consider discontinue if patient reports not taking at follow-up -BMP at 2 week follow-up

## 2022-03-07 NOTE — Patient Instructions (Addendum)
Mr. Tejera,  We were happy to see you in clinic today. As discussed, below is a summary of your care plan for this visit.  For your diabetes: - Your blood sugars are still running high, so we are making some medication changes to help lower them - Discontinue Glipizide - Continue Rybelsus 14 mg, 1 tablet daily - Start long-acting insulin (Lantus); inject 10 units once nightly - Please check your blood sugars twice a day. The goal for your morning fasting blood glucose is less than 140 - Follow-up in 2 weeks to see how you are doing with the new medication regimen  For your hypertension: - Your blood pressure of 138/78 today is running higher than goal of <120/80 - We are adding olmesartan (Benicar) 20 mg once daily to your blood pressure regimen  For your high cholesterol: - We recommend taking your prescribed statin as instructed and having a heart-healthy diet  For erectile dysfunction: - We are prescribing Viagra 50 mg tablet as needed  - If an erection has lasted for 3 hours or longer, please call 911 or go to the emergency room  Please note the following changes to your medications: - Stop Glipizide  - Start long-acting insulin (Lantus); inject 10 units once nightly - Start olmesartan (Benicar) 20 mg once daily  Please call our clinic if you have any questions or concerns, we may be able to help and keep you from a long and expensive emergency room wait. Our clinic and after hours phone number is [phone], the best time to call is Monday through Friday 9 am to 4 pm but there is always someone available 24/7 if you have an emergency. If you need medication refills please notify your pharmacy one week in advance and they will send [place] a request.

## 2022-03-08 ENCOUNTER — Other Ambulatory Visit (HOSPITAL_COMMUNITY): Payer: Self-pay

## 2022-03-08 LAB — MICROALBUMIN / CREATININE URINE RATIO
Creatinine, Urine: 83.6 mg/dL
Microalb/Creat Ratio: 9 mg/g creat (ref 0–29)
Microalbumin, Urine: 7.6 ug/mL

## 2022-03-09 ENCOUNTER — Other Ambulatory Visit (HOSPITAL_COMMUNITY): Payer: Self-pay

## 2022-03-09 ENCOUNTER — Telehealth: Payer: Self-pay | Admitting: Internal Medicine

## 2022-03-09 MED ORDER — OLMESARTAN MEDOXOMIL 20 MG PO TABS
20.0000 mg | ORAL_TABLET | Freq: Every day | ORAL | 11 refills | Status: DC
  Filled 2022-03-09: qty 30, 30d supply, fill #0

## 2022-03-09 NOTE — Telephone Encounter (Signed)
Please call the patient back.  He wants to talk about with BP medication to take and call in. Pt states he is not taking an BP meds.

## 2022-03-09 NOTE — Addendum Note (Signed)
Addended by: Edwyna Perfect on: 03/09/2022 11:31 AM   Modules accepted: Orders

## 2022-03-09 NOTE — Telephone Encounter (Signed)
RTC to patient would like to restart his Olmesartan blood pressure medication.  States thought he had some of the  HCTZ left.  Wife has been taking.  Would like for the Olmesartan to be called to the [facility] on [address].

## 2022-03-11 ENCOUNTER — Other Ambulatory Visit (HOSPITAL_COMMUNITY): Payer: Self-pay

## 2022-03-18 NOTE — Progress Notes (Signed)
Internal Medicine Clinic Attending  Case discussed with Dr. Masters  At the time of the visit.  We reviewed the resident's history and exam and pertinent patient test results.  I agree with the assessment, diagnosis, and plan of care documented in the resident's note.  

## 2022-03-21 ENCOUNTER — Other Ambulatory Visit (HOSPITAL_COMMUNITY): Payer: Self-pay

## 2022-03-21 ENCOUNTER — Ambulatory Visit (INDEPENDENT_AMBULATORY_CARE_PROVIDER_SITE_OTHER): Payer: 59 | Admitting: Internal Medicine

## 2022-03-21 VITALS — BP 156/89 | HR 68 | Temp 97.9°F | Ht 66.0 in | Wt 175.4 lb

## 2022-03-21 DIAGNOSIS — E119 Type 2 diabetes mellitus without complications: Secondary | ICD-10-CM | POA: Diagnosis not present

## 2022-03-21 DIAGNOSIS — I10 Essential (primary) hypertension: Secondary | ICD-10-CM | POA: Diagnosis not present

## 2022-03-21 DIAGNOSIS — Z794 Long term (current) use of insulin: Secondary | ICD-10-CM

## 2022-03-21 DIAGNOSIS — Z87891 Personal history of nicotine dependence: Secondary | ICD-10-CM

## 2022-03-21 MED ORDER — INSULIN GLARGINE 100 UNIT/ML SOLOSTAR PEN
15.0000 [IU] | PEN_INJECTOR | Freq: Every evening | SUBCUTANEOUS | 2 refills | Status: DC
  Filled 2022-03-21: qty 15, 100d supply, fill #0
  Filled 2022-03-23: qty 3, 20d supply, fill #0
  Filled 2022-04-08 – 2022-04-18 (×2): qty 3, 20d supply, fill #1
  Filled 2022-05-11 (×2): qty 3, 20d supply, fill #2
  Filled 2022-05-23 – 2022-05-26 (×2): qty 3, 20d supply, fill #3
  Filled 2022-06-16: qty 3, 20d supply, fill #4

## 2022-03-21 MED ORDER — AMLODIPINE-OLMESARTAN 5-20 MG PO TABS
1.0000 | ORAL_TABLET | Freq: Every day | ORAL | 11 refills | Status: DC
  Filled 2022-03-21 (×2): qty 30, 30d supply, fill #0

## 2022-03-21 MED ORDER — AMLODIPINE BESYLATE-VALSARTAN 5-160 MG PO TABS
1.0000 | ORAL_TABLET | Freq: Every day | ORAL | 11 refills | Status: DC
  Filled 2022-03-21: qty 30, 30d supply, fill #0

## 2022-03-21 NOTE — Progress Notes (Addendum)
Subjective:  CC: hypertension, diabetes  HPI:  [name] is a 61 y.o. male with a past medical history stated below and presents today for follow-up on hypertension and diabetes. Please see problem based assessment and plan for additional details.  Past Medical History:  Diagnosis Date   Diabetes mellitus type 2, uncontrolled 03/23/2011   Dyslipidemia 03/23/2011   21.5% 10-year risk of heart disease or stroke.      Elevated PSA    Hypercalcemia    Hypertension    Vitamin D deficiency     Current Outpatient Medications on File Prior to Visit  Medication Sig Dispense Refill   Blood Glucose Monitoring Suppl (ONE TOUCH ULTRA SYSTEM KIT) W/DEVICE KIT Use to check sugar once to twice a day. Dx code: 250.00. 1 each 0   cholecalciferol (VITAMIN D3) 25 MCG (1000 UNIT) tablet Take 1,000 Units by mouth daily.     glucose blood (BAYER CONTOUR NEXT TEST) test strip Use as instructed 100 each 12   glucose blood (ONE TOUCH TEST STRIPS) test strip Use to check sugar once to twice a day. Dx code: 250.00. 100 each 12   hydrOXYzine (ATARAX/VISTARIL) 25 MG tablet Take 2 tablets (50 mg total) by mouth 2 (two) times daily as needed (allergic reaction). 20 tablet 0   Insulin Pen Needle (PEN NEEDLES) 32G X 4 MM MISC Use new needle with each injection 100 each 3   Lancets (ONETOUCH ULTRASOFT) lancets Use to check sugar once to twice a day. Dx code: 250.00. 100 each 12   rosuvastatin (CRESTOR) 20 MG tablet Take 1 tablet (20 mg total) by mouth daily. 30 tablet 1   Semaglutide (RYBELSUS) 14 MG TABS Take 1 tablet (14 mg total) by mouth daily before breakfast. 30 tablet 3   sildenafil (VIAGRA) 50 MG tablet Take 1 tablet (50 mg total) by mouth as needed for erectile dysfunction. 20 tablet 1   No current facility-administered medications on file prior to visit.    Family History  Problem Relation Age of Onset   Heart disease Father     Social History   Socioeconomic History   Marital status:  Married    Spouse name: Not on file   Number of children: Not on file   Years of education: 16   Highest education level: Not on file  Occupational History   Not on file  Tobacco Use   Smoking status: Former    Packs/day: 0.50    Types: Cigarettes    Quit date: 02/06/2013    Years since quitting: 9.1   Smokeless tobacco: Never   Tobacco comments:    Vapes   Vaping Use   Vaping Use: Never used  Substance and Sexual Activity   Alcohol use: Yes    Comment: Wine   Drug use: Never   Sexual activity: Yes  Other Topics Concern   Not on file  Social History Narrative   Not on file   Social Determinants of Health   Financial Resource Strain: Not on file  Food Insecurity: Not on file  Transportation Needs: Not on file  Physical Activity: Not on file  Stress: Not on file  Social Connections: Not on file  Intimate Partner Violence: Not on file    Review of Systems: ROS negative except for what is noted on the assessment and plan.  Objective:   Vitals:   03/21/22 1319 03/21/22 1350  BP: (!) 150/80 (!) 156/89  Pulse: 72 68  Temp: 97.9 F (36.6  C)   TempSrc: Oral   SpO2: 99%   Weight: 175 lb 6.4 oz (79.6 kg)   Height: _0  (1.676 m)     Physical Exam: Constitutional: well-appearing Cardiovascular: regular rate and rhythm, no m/r/g Pulmonary/Chest: normal work of breathing on room air, lungs clear to auscultation bilaterally Abdominal: soft, non-tender, non-distended MSK: normal bulk and tone Skin: warm and dry  Assessment & Plan:  Hypertension Blood pressure uncontrolled at 150/80 to 156/89 on repeat. Olmesartan 20 mg was sent in at last office visit. He has been taking this daily.  He does not check his blood pressure at home and denies side effects from medications.  A: Blood pressure remains above goal of < 120/80. P: He will need 2nd agent. Initially combo pill with amlodipine-olmesartan 5-20 mg was sent to [facility]. However I was notified that his insurance  does not cover this.  -Amlodipine-valsartan 5-160 mg -olmesartan removed from medication list. -follow-up in 4 weeks  Diabetes ([place]) Last A1c elevated at 11.6 9/23. He was started on long acting insulin at last office visit. He is taking glargine 10 units and semaglutide 14 mg qd. He checks blood sugar once daily. Fasting blood sugars consistantly >300. He denies polyuria and polydipsia. A/P: Increase glargine from 10 to 15 units Continue semaglutide 14 mg, Consider switching to injectable at follow-up. Repeat A1c in 4 weeks He did not tolerate metformin in the past and has history of euglycemic DKA.  Hypercalcemia, asymptomatic  Prior PTH 2018 was inappropriately normal in setting of hypercalcemia. Calcium has been persistently elevated in 10-11 range. He remains asymptomatic and previously declined additional work-up.  P: PTH  Addendum 11/20: Patient with history of mildly elevated calcium. No history of kidney dysfunction, kidney stones, osteoporosis and age is greater than 24.  PTH inappropriately elevated at 67. Differentials include primary hyperparathyroidism versus familial hypocalcuric hypercalcemia.  Next step would be 24 hr urine test. I called and reviewed results with patient.  He was open to further testing. If 24-hour urine calcium > 300, he would meet guideline for surgery.   Patient discussed with Dr. Walden Field Gregory Sosa, D.O. [facility]  PGY-2 Pager: [phone]  Phone: [phone] Date 03/28/2022  Time 6:41 PM

## 2022-03-21 NOTE — Patient Instructions (Signed)
Thank you, [name] for allowing [place] to provide your care today.   Blood pressure I am adding a 2nd medication to olmesartan. This is a combo pill with amlodipine and olesartan. Follow-up in 4 weeks and we can recheck blood pressure at that time.  Diabetes Increase lantus to 15 units. I do think that switching to Ozempic would be a good idea for you as we work towards getting your diabetes under better control. Plan to follow-up in 4 weeks and we will recheck blood work then. Good work with the insulin!!  High Calcium You calcium has been high for some time. I am getting some blood work to look at this a little closer.  I have ordered the following labs for you:   Lab Orders         Intact PTH (Includes Calcium)       I have ordered the following medication/changed the following medications:   Stop the following medications: Medications Discontinued During This Encounter  Medication Reason   olmesartan (BENICAR) 20 MG tablet Change in therapy   insulin glargine (LANTUS) 100 UNIT/ML Solostar Pen Reorder     Start the following medications: Meds ordered this encounter  Medications   insulin glargine (LANTUS) 100 UNIT/ML Solostar Pen    Sig: Inject 15 Units into the skin at bedtime.    Dispense:  15 mL    Refill:  2   amLODipine-olmesartan (AZOR) 5-20 MG tablet    Sig: Take 1 tablet by mouth daily.    Dispense:  30 tablet    Refill:  11     Follow up: 4 weeks   We look forward to seeing you next time. Please call our clinic at [phone] if you have any questions or concerns. The best time to call is Monday-Friday from 9am-4pm, but there is someone available 24/7. If after hours or the weekend, call the main hospital number and ask for the Internal Medicine Resident On-Call. If you need medication refills, please notify your pharmacy one week in advance and they will send [place] a request.   Thank you for trusting me with your care. Wishing you the best!   Rudene Christians,  DO [facility]

## 2022-03-22 NOTE — Assessment & Plan Note (Signed)
Blood pressure uncontrolled at 150/80 to 156/89 on repeat. Olmesartan 20 mg was sent in at last office visit. He has been taking this daily.  He does not check his blood pressure at home and denies side effects from medications.  A: Blood pressure remains above goal of < 120/80. P: He will need 2nd agent. Initially combo pill with amlodipine-olmesartan 5-20 mg was sent to [facility]. However I was notified that his insurance does not cover this.  -Amlodipine-valsartan 5-160 mg -olmesartan removed from medication list. -follow-up in 4 weeks

## 2022-03-22 NOTE — Assessment & Plan Note (Signed)
Last A1c elevated at 11.6 9/23. He was started on long acting insulin at last office visit. He is taking glargine 10 units and semaglutide 14 mg qd. He checks blood sugar once daily. Fasting blood sugars consistantly >300. He denies polyuria and polydipsia. A/P: Increase glargine from 10 to 15 units Continue semaglutide 14 mg, Consider switching to injectable at follow-up. Repeat A1c in 4 weeks He did not tolerate metformin in the past and has history of euglycemic DKA.

## 2022-03-22 NOTE — Assessment & Plan Note (Addendum)
Prior PTH 2018 was inappropriately normal in setting of hypercalcemia. Calcium has been persistently elevated in 10-11 range. He remains asymptomatic and previously declined additional work-up.  P: PTH  Addendum 11/20: Patient with history of mildly elevated calcium. No history of kidney dysfunction, kidney stones, osteoporosis and age is greater than 50.  PTH inappropriately elevated at 67. Differentials include primary hyperparathyroidism versus familial hypocalcuric hypercalcemia.  Next step would be 24 hr urine test. I called and reviewed results with patient.  He was open to further testing. If 24-hour urine calcium > 300, he would meet guideline for surgery.

## 2022-03-23 ENCOUNTER — Other Ambulatory Visit (HOSPITAL_COMMUNITY): Payer: Self-pay

## 2022-03-25 LAB — INTACT PTH (INCLUDES CALCIUM)
Calcium, Serum: 10.9 mg/dL — ABNORMAL HIGH
PTH (Intact Assay): 67 pg/mL — ABNORMAL HIGH

## 2022-03-28 NOTE — Addendum Note (Signed)
Addended by: Lucille Passy on: 03/28/2022 06:42 PM   Modules accepted: Orders

## 2022-03-29 NOTE — Addendum Note (Signed)
Addended by: Bufford Spikes on: 03/29/2022 09:27 AM   Modules accepted: Orders

## 2022-04-04 NOTE — Progress Notes (Signed)
Internal Medicine Clinic Attending  Case discussed with Dr. Masters  at the time of the visit.  We reviewed the resident's history and exam and pertinent patient test results.  I agree with the assessment, diagnosis, and plan of care documented in the resident's note.  

## 2022-04-07 ENCOUNTER — Other Ambulatory Visit: Payer: Self-pay | Admitting: Internal Medicine

## 2022-04-07 DIAGNOSIS — E1165 Type 2 diabetes mellitus with hyperglycemia: Secondary | ICD-10-CM

## 2022-04-08 ENCOUNTER — Other Ambulatory Visit (HOSPITAL_COMMUNITY): Payer: Self-pay

## 2022-04-11 ENCOUNTER — Other Ambulatory Visit: Payer: 59

## 2022-04-11 ENCOUNTER — Other Ambulatory Visit (HOSPITAL_COMMUNITY): Payer: Self-pay

## 2022-04-12 ENCOUNTER — Other Ambulatory Visit: Payer: Self-pay | Admitting: Internal Medicine

## 2022-04-12 DIAGNOSIS — E1165 Type 2 diabetes mellitus with hyperglycemia: Secondary | ICD-10-CM

## 2022-04-12 NOTE — Telephone Encounter (Signed)
Next appt scheduled 05/16/22 with Dr Benito Mccreedy.

## 2022-04-13 LAB — OTHER LAB TEST

## 2022-04-13 NOTE — Telephone Encounter (Signed)
Medication was refilled 01/2022 with 3 RF's. Informed pt to call the pharmacy.

## 2022-04-13 NOTE — Telephone Encounter (Signed)
Pt is calling back to find out why his Medicine has not been called in.  Made the pt aware of the 24-48 hour turn around time for refills.  Pt states he would still like to be called anyway to know when his medication has been filled.   Semaglutide (RYBELSUS) 14 MG TABS   [id number] - [place], [place] - [address] AT [facility] OF [address]

## 2022-04-15 LAB — CALCIUM, 24HR, UR W/CREATININE
Calcium, 24H Urine: 343 mg/24 hr — ABNORMAL HIGH (ref 0–320)
Calcium, Urine: 15.6 mg/dL
Calcium/Creat.Ratio: 170 mg/g creat (ref 14–318)
Creatinine, Urine: 91.8 mg/dL

## 2022-04-19 ENCOUNTER — Other Ambulatory Visit (HOSPITAL_COMMUNITY): Payer: Self-pay

## 2022-04-19 ENCOUNTER — Other Ambulatory Visit: Payer: Self-pay

## 2022-04-21 ENCOUNTER — Other Ambulatory Visit (HOSPITAL_COMMUNITY): Payer: Self-pay

## 2022-04-21 ENCOUNTER — Telehealth: Payer: Self-pay | Admitting: Internal Medicine

## 2022-04-21 ENCOUNTER — Other Ambulatory Visit: Payer: Self-pay | Admitting: Internal Medicine

## 2022-04-21 DIAGNOSIS — I10 Essential (primary) hypertension: Secondary | ICD-10-CM

## 2022-04-21 MED ORDER — OLMESARTAN MEDOXOMIL 20 MG PO TABS
20.0000 mg | ORAL_TABLET | Freq: Every day | ORAL | 11 refills | Status: DC
  Filled 2022-04-21: qty 30, 30d supply, fill #0
  Filled 2022-06-16: qty 30, 30d supply, fill #1
  Filled 2022-07-18: qty 30, 30d supply, fill #2
  Filled 2022-08-18: qty 30, 30d supply, fill #3
  Filled 2022-09-12 (×2): qty 30, 30d supply, fill #4
  Filled 2022-10-10: qty 30, 30d supply, fill #5
  Filled 2022-11-13: qty 30, 30d supply, fill #6
  Filled 2022-12-15: qty 30, 30d supply, fill #7
  Filled 2023-01-15: qty 30, 30d supply, fill #8
  Filled 2023-02-14: qty 30, 30d supply, fill #9
  Filled 2023-03-15: qty 30, 30d supply, fill #10
  Filled 2023-04-13: qty 30, 30d supply, fill #11

## 2022-04-21 NOTE — Telephone Encounter (Signed)
Patient's blood pressure was not well-controlled November 13.  He was asked to follow-up in 4 weeks.  Patient has not followed up yet.  No appointments next week.  He endorses dizziness on the new combination pill.  Will send in refill for lower dose of olmesartan 20 mg.  Combination pill discontinued from medication list.  Please call patient to schedule follow-up to recheck blood pressure.  Called and talked with patient to let him know that he needs to discontinue combination pill and he can pick up on olmesartan. 

## 2022-04-21 NOTE — Telephone Encounter (Signed)
Patient's blood pressure was not well-controlled November 13.  He was asked to follow-up in 4 weeks.  Patient has not followed up yet.  No appointments next week.  He endorses dizziness on the new combination pill.  Will send in refill for lower dose of olmesartan 20 mg.  Combination pill discontinued from medication list.  Please call patient to schedule follow-up to recheck blood pressure.  Called and talked with patient to let him know that he needs to discontinue combination pill and he can pick up on olmesartan.

## 2022-04-22 NOTE — Telephone Encounter (Signed)
Pt has an appt scheduled 05/16/22. Does he needs to be sen sooner?

## 2022-05-04 ENCOUNTER — Other Ambulatory Visit (HOSPITAL_COMMUNITY): Payer: Self-pay

## 2022-05-11 ENCOUNTER — Other Ambulatory Visit: Payer: Self-pay

## 2022-05-11 ENCOUNTER — Other Ambulatory Visit (HOSPITAL_COMMUNITY): Payer: Self-pay

## 2022-05-16 ENCOUNTER — Ambulatory Visit: Payer: 59 | Admitting: Student

## 2022-05-16 ENCOUNTER — Encounter: Payer: Self-pay | Admitting: Student

## 2022-05-16 VITALS — BP 133/82 | HR 73 | Ht 66.0 in | Wt 171.0 lb

## 2022-05-16 DIAGNOSIS — I10 Essential (primary) hypertension: Secondary | ICD-10-CM | POA: Diagnosis not present

## 2022-05-16 DIAGNOSIS — E782 Mixed hyperlipidemia: Secondary | ICD-10-CM | POA: Diagnosis not present

## 2022-05-16 DIAGNOSIS — Z794 Long term (current) use of insulin: Secondary | ICD-10-CM

## 2022-05-16 DIAGNOSIS — Z91199 Patient's noncompliance with other medical treatment and regimen due to unspecified reason: Secondary | ICD-10-CM

## 2022-05-16 DIAGNOSIS — E210 Primary hyperparathyroidism: Secondary | ICD-10-CM | POA: Diagnosis not present

## 2022-05-16 DIAGNOSIS — E119 Type 2 diabetes mellitus without complications: Secondary | ICD-10-CM

## 2022-05-16 DIAGNOSIS — Z87891 Personal history of nicotine dependence: Secondary | ICD-10-CM

## 2022-05-16 LAB — POCT GLYCOSYLATED HEMOGLOBIN (HGB A1C): Hemoglobin A1C: 11.3 % — AB (ref 4.0–5.6)

## 2022-05-16 LAB — GLUCOSE, CAPILLARY: Glucose-Capillary: 221 mg/dL — ABNORMAL HIGH (ref 70–99)

## 2022-05-16 NOTE — Assessment & Plan Note (Signed)
This patient's diabetes remains poorly controlled and he has evidence of some changes to feet, notably dry skin bilaterally and absent pulses on the right.  We talked about injectable semaglutide as an alternative to the oral formulation.  He is resistant to changes to medication management at this point.  He does report using his nightly long-acting insulin, 15 units.

## 2022-05-16 NOTE — Assessment & Plan Note (Signed)
Poorly controlled as evidenced by home blood pressure readings which patient reports to be on average around 998 systolic.  He acknowledges that he is not generally adherent to his antihypertensive therapy.  He states feeling well as a barrier to taking medications.

## 2022-05-16 NOTE — Assessment & Plan Note (Addendum)
Patient's denies abdominal pain, mood problems, urinary symptoms.  We talked about the risk of osteoporosis and renal stones with hypercalcemia.  At this time, he is not interested in surgical consultation for parathyroidectomy.

## 2022-05-16 NOTE — Assessment & Plan Note (Signed)
Overall, patient acknowledges low adherence to medical therapy including antihypertensive and statin therapy.  He is resistant to changes to medical management for his chronic conditions.  He also declines immunizations and screening for colon cancer.  He reports difficulty taking medications when he does not feel ill.  I shared my urgent concern with the patient that he is at high risk for ASCVD event or other morbidities like lower extremity amputation despite currently feeling relatively well.

## 2022-05-16 NOTE — Assessment & Plan Note (Addendum)
Remains non-adherent to statin therapy.

## 2022-05-16 NOTE — Patient Instructions (Addendum)
Today we discussed diabetes, high blood pressure, high cholesterol, and your elevated risk for heart attacks and strokes, among other serious medical conditions.  I worry that your health may take precipitous decline in the next few years because of several uncontrolled risk factors for cardiovascular disease.  As one of your doctors, I am always happy to talk about these issues.  If telehealth visits are more convenient for you, we can discuss medication changes over the phone.  If there is anything we can do to help make taking medicines easier or less burdensome, please let me know.  Return in 4 months for follow up of blood pressure, cholesterol, and diabetes.   I will call you with the results of the following laboratory test(s):   Lab Orders         Glucose, capillary         POC Hbg A1C      Please call our clinic at [phone] Monday through Friday from 9 am to 4 pm if you have questions or concerns about your health. If after hours or on the weekend, call the main hospital number and ask for the Internal Medicine Resident On-Call. If you need medication refills, please notify your pharmacy one week in advance and they will send [place] a request.   Best, Nani Gasser, [place]

## 2022-05-16 NOTE — Progress Notes (Signed)
Subjective:  [name] is a 62 y.o. who presents to clinic for routine follow-up.  He has no concerns.  He reports nonadherence to medical therapy for hypertension and hyperlipidemia.  It is difficult for him to take medications when he feels well, which he does most the time.  He does adhere to his nightly long-acting insulin.  He has tried metformin in the past which was intolerable because of side effects.  He has tried SGLT2 inhibitors, which were stopped because of euglycemic ketoacidosis.  Does not usually measure fasting blood glucose, but rather measures in the evening and it is typically pretty high.  Regarding his hypertension, he often measures in the morning with systolics between 161-096 but usually on average around 150.  Patient Active Problem List   Diagnosis Date Noted   Patient non adherence 05/16/2022   Erectile dysfunction 02/06/2022   Primary hyperparathyroidism ([place]) 10/13/2014   Hypertension 03/23/2011   Diabetes ([place]) 03/23/2011   Hyperlipidemia 03/23/2011   Social history notable for son and grandson that live in town that he enjoys spending time with.  He reports this is a potential motivation for his own health promotion.  Objective:   Vitals:   05/16/22 1334  BP: 133/82  Pulse: 73  SpO2: 100%  Weight: 171 lb (77.6 kg)  Height: 5\' 6"  (1.676 m)    Physical Exam Constitutional:      General: He is not in acute distress.    Appearance: Normal appearance.  Neck:     Vascular: No carotid bruit.  Cardiovascular:     Rate and Rhythm: Normal rate and regular rhythm.     Pulses: Normal pulses.  Pulmonary:     Effort: Pulmonary effort is normal.     Breath sounds: Normal breath sounds. No stridor.  Musculoskeletal:     Right lower leg: No edema.     Left lower leg: No edema.  Feet:     Comments: Dry skin bilaterally with palpable dorsalis pedis and posterior tibialis pulses in right foot.  DP and PT present in left foot.  Sensation with  monofilament testing intact bilaterally. Lymphadenopathy:     Cervical: No cervical adenopathy.  Skin:    General: Skin is warm and dry.  Neurological:     Mental Status: He is alert. Mental status is at baseline.  Psychiatric:        Mood and Affect: Mood normal.        Behavior: Behavior normal.     Assessment & Plan:  The primary encounter diagnosis was Type 2 diabetes mellitus without complication, without long-term current use of insulin ([place]). Diagnoses of Hypertension, unspecified type, Mixed hyperlipidemia, Primary hyperparathyroidism ([place]), and Patient non adherence were also pertinent to this visit.  Hypertension Poorly controlled as evidenced by home blood pressure readings which patient reports to be on average around 045 systolic.  He acknowledges that he is not generally adherent to his antihypertensive therapy.  He states feeling well as a barrier to taking medications.  Diabetes ([place]) This patient's diabetes remains poorly controlled and he has evidence of some changes to feet, notably dry skin bilaterally and absent pulses on the right.  We talked about injectable semaglutide as an alternative to the oral formulation.  He is resistant to changes to medication management at this point.  He does report using his nightly long-acting insulin, 15 units.  Hyperlipidemia Remains non-adherent to statin therapy.  Primary hyperparathyroidism ([place]) Patient's denies abdominal pain, mood problems, urinary symptoms.  We talked about the risk of osteoporosis and renal stones with hypercalcemia.  At this time, he is not interested in surgical consultation for parathyroidectomy.  Patient non adherence Overall, patient acknowledges low adherence to medical therapy including antihypertensive and statin therapy.  He is resistant to changes to medical management for his chronic conditions.  He also declines immunizations and screening for colon cancer.  He reports difficulty taking  medications when he does not feel ill.  I shared my urgent concern with the patient that he is at high risk for ASCVD event or other morbidities like lower extremity amputation despite currently feeling relatively well.    Return in 4 months for follow up of blood pressure, cholesterol, and diabetes.  Patient discussed with Dr. Raymond Gurney MD 05/16/2022, 6:25 PM  Pager: [phone]

## 2022-05-20 ENCOUNTER — Telehealth: Payer: Self-pay

## 2022-05-20 NOTE — Telephone Encounter (Signed)
A Prior Authorization was initiated for this patients RYBELSUS through CoverMyMeds.   Key:  [id number]

## 2022-05-22 NOTE — Progress Notes (Signed)
Internal Medicine Clinic Attending  Case discussed with Dr. McLendon  at the time of the visit.  We reviewed the resident's history and exam and pertinent patient test results.  I agree with the assessment, diagnosis, and plan of care documented in the resident's note.  

## 2022-05-22 NOTE — Addendum Note (Signed)
Addended by: Gilles Chiquito B on: 05/22/2022 12:19 PM   Modules accepted: Level of Service

## 2022-05-23 ENCOUNTER — Other Ambulatory Visit (HOSPITAL_COMMUNITY): Payer: Self-pay

## 2022-05-23 NOTE — Telephone Encounter (Signed)
Prior Auth for patients medication RYBELSUS approved by [employer] from 05/20/22 to 05/21/23.  Key: [id number]

## 2022-05-26 ENCOUNTER — Other Ambulatory Visit (HOSPITAL_COMMUNITY): Payer: Self-pay

## 2022-06-16 ENCOUNTER — Other Ambulatory Visit (HOSPITAL_COMMUNITY): Payer: Self-pay

## 2022-06-16 ENCOUNTER — Other Ambulatory Visit: Payer: Self-pay

## 2022-06-22 ENCOUNTER — Ambulatory Visit: Payer: 59 | Admitting: Internal Medicine

## 2022-06-22 ENCOUNTER — Other Ambulatory Visit: Payer: Self-pay

## 2022-06-22 DIAGNOSIS — E119 Type 2 diabetes mellitus without complications: Secondary | ICD-10-CM

## 2022-06-22 DIAGNOSIS — I2089 Other forms of angina pectoris: Secondary | ICD-10-CM

## 2022-06-22 DIAGNOSIS — I10 Essential (primary) hypertension: Secondary | ICD-10-CM

## 2022-06-22 DIAGNOSIS — Z794 Long term (current) use of insulin: Secondary | ICD-10-CM

## 2022-06-22 DIAGNOSIS — R079 Chest pain, unspecified: Secondary | ICD-10-CM

## 2022-06-22 NOTE — Patient Instructions (Addendum)
Thank you, [name] for allowing [place] to provide your care today.   Chest pain If you have further episodes of chest, you NEED to go to the emergency room. With your history to diabetes and high blood pressure I am concerned about heart disease so have referred you to cardiology for stress testing.   Diabetes Check your blood sugars in the morning before eating breakfast and bring glucometer in at follow-up in 2 weeks. We know how to adjust insulin by these #s. The reason why we focus on this is because diabetes increases risk of heart attack and stroke.  Blood pressure Please take olmesartan daily. Blood pressure goal is <120/80.  Referrals ordered today:   Referral Orders         Ambulatory referral to Cardiology       I have ordered the following medication/changed the following medications:   Stop the following medications: There are no discontinued medications.   Start the following medications: No orders of the defined types were placed in this encounter.    Follow up:  2 weeks    We look forward to seeing you next time. Please call our clinic at [phone] if you have any questions or concerns. The best time to call is Monday-Friday from 9am-4pm, but there is someone available 24/7. If after hours or the weekend, call the main hospital number and ask for the Internal Medicine Resident On-Call. If you need medication refills, please notify your pharmacy one week in advance and they will send [place] a request.   Thank you for trusting me with your care. Wishing you the best!   Christiana Fuchs, [place]

## 2022-06-22 NOTE — Progress Notes (Unsigned)
Subjective:  CC: chest pain  HPI:  [name] is a 62 y.o. male with a past medical history stated below and presents today for chest pain.  He had 2 episodes of chest pain within the last 3 weeks.  The first was following a 17-hour flight to [place].  After landing he had chest pain while ambulating resolved on its own.  Pain was located on the left side of chest without radiation.  He did not have shortness of breath or other symptoms at that time.  Second episode happened in the airport whenever he was navigating between flights.  Pain was located in the same area but was worse in severity.  Once he got on the plane and sat down the pain persisted for 2 hours.  His wife who is a nurse encouraged him to go to the emergency room once they got back home, but he did not want to.  Last chest pain episode was on February 8. Please see problem based assessment and plan for additional details.  Past Medical History:  Diagnosis Date   Diabetes mellitus type 2, uncontrolled 03/23/2011   Dyslipidemia 03/23/2011   21.5% 10-year risk of heart disease or stroke.      Elevated PSA    Elevated PSA, between 10 and less than 20 ng/ml 08/02/2012   GERD (gastroesophageal reflux disease) 10/09/2012   H/O atypical chest pain 03/23/2011   S/p clean cath on 03/01/11-no evidence of significant CAD.  Refused cardiology referral when cardiology attempted to make appointment.     Hypercalcemia    Hypertension    Vitamin D deficiency     Current Outpatient Medications on File Prior to Visit  Medication Sig Dispense Refill   Blood Glucose Monitoring Suppl (ONE TOUCH ULTRA SYSTEM KIT) W/DEVICE KIT Use to check sugar once to twice a day. Dx code: 250.00. 1 each 0   cholecalciferol (VITAMIN D3) 25 MCG (1000 UNIT) tablet Take 1,000 Units by mouth daily.     glucose blood (BAYER CONTOUR NEXT TEST) test strip Use as instructed 100 each 12   glucose blood (ONE TOUCH TEST STRIPS) test strip Use to check sugar  once to twice a day. Dx code: 250.00. 100 each 12   hydrOXYzine (ATARAX/VISTARIL) 25 MG tablet Take 2 tablets (50 mg total) by mouth 2 (two) times daily as needed (allergic reaction). 20 tablet 0   insulin glargine (LANTUS) 100 UNIT/ML Solostar Pen Inject 15 Units into the skin at bedtime. 15 mL 2   Insulin Pen Needle (PEN NEEDLES) 32G X 4 MM MISC Use new needle with each injection 100 each 3   Lancets (ONETOUCH ULTRASOFT) lancets Use to check sugar once to twice a day. Dx code: 250.00. 100 each 12   olmesartan (BENICAR) 20 MG tablet Take 1 tablet (20 mg total) by mouth daily. 30 tablet 11   rosuvastatin (CRESTOR) 20 MG tablet Take 1 tablet (20 mg total) by mouth daily. 30 tablet 1   RYBELSUS 14 MG TABS TAKE 1 TABLET BY MOUTH EVERY DAY BEFORE BREAKFAST 30 tablet 3   sildenafil (VIAGRA) 50 MG tablet Take 1 tablet (50 mg total) by mouth as needed for erectile dysfunction. 20 tablet 1   No current facility-administered medications on file prior to visit.    Family History  Problem Relation Age of Onset   Heart disease Father     Social History   Socioeconomic History   Marital status: Married    Spouse name: Not on file  Number of children: Not on file   Years of education: 16   Highest education level: Not on file  Occupational History   Not on file  Tobacco Use   Smoking status: Former    Packs/day: 0.50    Types: Cigarettes    Quit date: 02/06/2013    Years since quitting: 9.3   Smokeless tobacco: Never   Tobacco comments:    Vapes   Vaping Use   Vaping Use: Never used  Substance and Sexual Activity   Alcohol use: Yes    Comment: Wine   Drug use: Never   Sexual activity: Yes  Other Topics Concern   Not on file  Social History Narrative   Not on file   Social Determinants of Health   Financial Resource Strain: Not on file  Food Insecurity: Not on file  Transportation Needs: Not on file  Physical Activity: Not on file  Stress: Not on file  Social Connections: Not  on file  Intimate Partner Violence: Not on file    Review of Systems: ROS negative except for what is noted on the assessment and plan.  Objective:  There were no vitals filed for this visit.  Physical Exam: Constitutional: well-appearing  Cardiovascular: regular rate and rhythm, no m/r/g, no pain to palpation of left chest wall Pulmonary/Chest: normal work of breathing on room air, lungs clear to auscultation bilaterallyd MSK: normal bulk and tone Skin: warm and dry  Assessment & Plan:  Chest pain Patient presents with 2 episodes of chest pain.  His description of the episodes do seem to be related to activity as he was walking and moving when it occurred.  He characteristics seem somewhat atypical as he did not have any radiation of pain, but did not describe the pain as sharp.  No additional symptoms on review of system. Assessment: Differentials include angina versus acid reflux versus PE.  Wells criteria 0 with no lower extremity edema and pulse within normal limits.  ASCVD risk elevated at 50 and patient does not take statin therapy.  He is at high risk of CAD which is concerning in setting of new onset chest pain.  Prior EKG 2016 showed J-point elevation in leads V3 and V4, no indications for EKG at this time as he is not currently having chest pain. Plan: Referral to cardiology  Hypertension Pressure is persistently elevated in clinic> 150/90.  He endorses taking olmesartan 4 times weekly but frequently misses doses.  He checks his blood pressures at home on occasion and if pressures are around 130/80 he does not take medications.  In the past he has been o multiple medications for blood pressure and had dizziness which led him to discontinuing. P: Encouraged patient to take olmesartan daily with blood pressure goal less than 120/80.  Diabetes ([place]) Medications include Lantus 15 units and Rybelsus 14 mg daily.  He is blood sugar at night before injecting insulin and glucose  typically greater than 250.  He did not bring glucometer in today.  He does not feel having low blood sugars and denies polydipsia and polyuria.  A1c is elevated at 11.3.  Starting insulin his A1c went from 11.6-11.3.  I do not think that he is adherent with taking insulin.  I talked with him about concern for increased risk of heart disease and stroke with uncontrolled diabetes as well as organ dysfunction and kidneys and decreased sensation in extremities. Plan: Using teach back method, he stated that he would check blood sugars in  the morning before eating breakfast and bring in glucometer next office visit. -Continue Lantus 15 units and Rybelsus -follow-up in 2 weeks    Patient discussed with Dr. Erroll Luna Langdon Crosson, D.O. [facility]  PGY-2 Pager: [phone]  Phone: [phone] Date 06/23/2022  Time 3:35 PM

## 2022-06-23 DIAGNOSIS — R079 Chest pain, unspecified: Secondary | ICD-10-CM | POA: Insufficient documentation

## 2022-06-23 NOTE — Assessment & Plan Note (Addendum)
Medications include Lantus 15 units and Rybelsus 14 mg daily.  He is blood sugar at night before injecting insulin and glucose typically greater than 250.  He did not bring glucometer in today.  He does not feel having low blood sugars and denies polydipsia and polyuria.  A1c is elevated at 11.3.  Starting insulin his A1c went from 11.6-11.3.  I do not think that he is adherent with taking insulin.  I talked with him about concern for increased risk of heart disease and stroke with uncontrolled diabetes as well as organ dysfunction and kidneys and decreased sensation in extremities. Plan: Using teach back method, he stated that he would check blood sugars in the morning before eating breakfast and bring in glucometer next office visit. -Continue Lantus 15 units and Rybelsus -follow-up in 2 weeks

## 2022-06-23 NOTE — Assessment & Plan Note (Signed)
Patient presents with 2 episodes of chest pain.  His description of the episodes do seem to be related to activity as he was walking and moving when it occurred.  He characteristics seem somewhat atypical as he did not have any radiation of pain, but did not describe the pain as sharp.  No additional symptoms on review of system. Assessment: Differentials include angina versus acid reflux versus PE.  Wells criteria 0 with no lower extremity edema and pulse within normal limits.  ASCVD risk elevated at 50 and patient does not take statin therapy.  He is at high risk of CAD which is concerning in setting of new onset chest pain.  Prior EKG 2016 showed J-point elevation in leads V3 and V4, no indications for EKG at this time as he is not currently having chest pain. Plan: Referral to cardiology

## 2022-06-23 NOTE — Assessment & Plan Note (Signed)
Pressure is persistently elevated in clinic> 150/90.  He endorses taking olmesartan 4 times weekly but frequently misses doses.  He checks his blood pressures at home on occasion and if pressures are around 130/80 he does not take medications.  In the past he has been o multiple medications for blood pressure and had dizziness which led him to discontinuing. P: Encouraged patient to take olmesartan daily with blood pressure goal less than 120/80.

## 2022-06-28 NOTE — Progress Notes (Signed)
Internal Medicine Clinic Attending  Case discussed with Dr. Masters  At the time of the visit.  We reviewed the resident's history and exam and pertinent patient test results.  I agree with the assessment, diagnosis, and plan of care documented in the resident's note.  

## 2022-06-28 NOTE — Addendum Note (Signed)
Addended by: Charise Killian on: 06/28/2022 10:28 AM   Modules accepted: Level of Service

## 2022-07-07 ENCOUNTER — Other Ambulatory Visit: Payer: Self-pay

## 2022-07-07 ENCOUNTER — Other Ambulatory Visit (HOSPITAL_COMMUNITY): Payer: Self-pay

## 2022-07-07 ENCOUNTER — Ambulatory Visit: Payer: 59 | Admitting: Student

## 2022-07-07 ENCOUNTER — Encounter: Payer: Self-pay | Admitting: Student

## 2022-07-07 VITALS — BP 146/74 | HR 81 | Temp 97.8°F | Ht 66.0 in | Wt 168.8 lb

## 2022-07-07 DIAGNOSIS — N529 Male erectile dysfunction, unspecified: Secondary | ICD-10-CM

## 2022-07-07 DIAGNOSIS — E1165 Type 2 diabetes mellitus with hyperglycemia: Secondary | ICD-10-CM

## 2022-07-07 DIAGNOSIS — E119 Type 2 diabetes mellitus without complications: Secondary | ICD-10-CM

## 2022-07-07 MED ORDER — XULTOPHY 100-3.6 UNIT-MG/ML ~~LOC~~ SOPN
16.0000 [IU] | PEN_INJECTOR | Freq: Every day | SUBCUTANEOUS | 2 refills | Status: DC
  Filled 2022-07-07: qty 3, 19d supply, fill #0

## 2022-07-07 MED ORDER — SILDENAFIL CITRATE 50 MG PO TABS
50.0000 mg | ORAL_TABLET | ORAL | 1 refills | Status: DC | PRN
  Filled 2022-07-07: qty 20, 20d supply, fill #0
  Filled 2022-09-30 (×2): qty 20, 20d supply, fill #1

## 2022-07-07 NOTE — Patient Instructions (Signed)
[name], it was a pleasure seeing you today!  Today we discussed: - We are switching your diabetes medications today. Do not take your insulin or Rybelsus. Instead, you are going to take 16 units of Xultophy (this has insulin and another type of diabetes medication in it). Keep taking your sugars in the morning before breakfast.   - Come back in two weeks with your meter.   Follow-up:  2 weeks    Please make sure to arrive 15 minutes prior to your next appointment. If you arrive late, you may be asked to reschedule.   We look forward to seeing you next time. Please call our clinic at [phone] if you have any questions or concerns. The best time to call is Monday-Friday from 9am-4pm, but there is someone available 24/7. If after hours or the weekend, call the main hospital number and ask for the Internal Medicine Resident On-Call. If you need medication refills, please notify your pharmacy one week in advance and they will send [place] a request.  Thank you for letting us take part in your care. Wishing you the best!  Thank you, [name], MD

## 2022-07-08 NOTE — Progress Notes (Signed)
   CC: diabetes follow-up  HPI:  [name] is a 62 y.o. person with medical history as below presenting to [facility] for diabetes follow-up  Please see problem-based list for further details, assessments, and plans.  Past Medical History:  Diagnosis Date   Diabetes mellitus type 2, uncontrolled 03/23/2011   Dyslipidemia 03/23/2011   21.5% 10-year risk of heart disease or stroke.      Elevated PSA    Elevated PSA, between 10 and less than 20 ng/ml 08/02/2012   GERD (gastroesophageal reflux disease) 10/09/2012   H/O atypical chest pain 03/23/2011   S/p clean cath on 03/01/11-no evidence of significant CAD.  Refused cardiology referral when cardiology attempted to make appointment.     Hypercalcemia    Hypertension    Vitamin D deficiency    Review of Systems:  As per HPI  Physical Exam:  Vitals:   07/07/22 1045  BP: (!) 146/74  Pulse: 81  Temp: 97.8 F (36.6 C)  TempSrc: Oral  SpO2: 100%  Weight: 168 lb 12.8 oz (76.6 kg)  Height: '5\' 6"'$  (1.676 m)   General: Resting comfortably in no acute distress CV: Regular rate, rhythm. No murmurs appreciated. Pulm: Normal work of breathing on room air.  Neuro: Awake, alert, conversing appropriately.  Psych: Normal mood, affect, speech.  Assessment & Plan:   Diabetes [facility]) Patient is presenting to clinic today to discuss diabetes. He did bring his meter in, which shows consistently elevated sugars in the 200-300's, no hypoglycemic events. He remains asymptomatic without polyuria or polydipsia. He has been eating and drinking well without issues. States he has been compliant with his insulin and Rybelsus on most days.   We discussed transitioning to injectable GLP-1 today. In order to decrease medication burden, we will plan for a once daily combination insulin-GLP-1 injectable. I have instructed the patient to no longer take insulin or Rybelsus - he will only take this injection. He verbalized understanding.   - Start Xultophy 16u  daily - At next visit in two weeks would discuss up-titrating 2u every week until optimal glycemic control is reached  Patient discussed with Dr. Jacquenette Shone, MD Internal Medicine PGY-3 Pager: [phone]

## 2022-07-08 NOTE — Assessment & Plan Note (Signed)
Patient is presenting to clinic today to discuss diabetes. He did bring his meter in, which shows consistently elevated sugars in the 200-300's, no hypoglycemic events. He remains asymptomatic without polyuria or polydipsia. He has been eating and drinking well without issues. States he has been compliant with his insulin and Rybelsus on most days.   We discussed transitioning to injectable GLP-1 today. In order to decrease medication burden, we will plan for a once daily combination insulin-GLP-1 injectable. I have instructed the patient to no longer take insulin or Rybelsus - he will only take this injection. He verbalized understanding.   - Start Xultophy 16u daily - At next visit in two weeks would discuss up-titrating 2u every week until optimal glycemic control is reached  ADDENDUM: Xultophy not covered by insurance, preferred [place]. We will start this at 15u instead. - Start Soliqua 15u

## 2022-07-08 NOTE — Progress Notes (Signed)
Internal Medicine Clinic Attending ? ?Case discussed with Dr. Braswell  At the time of the visit.  We reviewed the resident?s history and exam and pertinent patient test results.  I agree with the assessment, diagnosis, and plan of care documented in the resident?s note.  ?

## 2022-07-12 ENCOUNTER — Telehealth: Payer: Self-pay

## 2022-07-12 ENCOUNTER — Other Ambulatory Visit (HOSPITAL_COMMUNITY): Payer: Self-pay

## 2022-07-12 MED ORDER — SOLIQUA 100-33 UNT-MCG/ML ~~LOC~~ SOPN
15.0000 [IU] | PEN_INJECTOR | Freq: Every day | SUBCUTANEOUS | 0 refills | Status: DC
  Filled 2022-07-12: qty 3, 20d supply, fill #0

## 2022-07-12 NOTE — Telephone Encounter (Signed)
Prior Authorization for patient Gregory Sosa) came through on cover my meds was submitted with last office notes and labs awaiting approval or denial

## 2022-07-12 NOTE — Addendum Note (Signed)
Addended bySanjuan Dame on: 07/12/2022 06:36 PM   Modules accepted: Orders

## 2022-07-13 ENCOUNTER — Other Ambulatory Visit (HOSPITAL_COMMUNITY): Payer: Self-pay

## 2022-07-13 NOTE — Telephone Encounter (Signed)
I have sent in alternative to [place].

## 2022-07-13 NOTE — Telephone Encounter (Signed)
Decision:Denied Kassius Fetherolf (Key: [id number]) Claris Che 100/3.6 Units-mg/mL Form [employer] Electronic Prior Authorization Form [id number] NCPDP) Created Message from Plan Request Reference Number: [id number]. XULTOPHY INJ 100/3.6 is denied for not meeting the prior authorization requirement(s). Details of this decision are in the notice attached below or have been faxed to you.

## 2022-07-18 ENCOUNTER — Other Ambulatory Visit (HOSPITAL_COMMUNITY): Payer: Self-pay

## 2022-07-18 ENCOUNTER — Encounter: Payer: Self-pay | Admitting: Cardiovascular Disease

## 2022-07-18 ENCOUNTER — Ambulatory Visit: Payer: 59 | Attending: Cardiovascular Disease | Admitting: Cardiovascular Disease

## 2022-07-18 VITALS — BP 132/76 | HR 75 | Ht 66.0 in | Wt 168.2 lb

## 2022-07-18 DIAGNOSIS — I10 Essential (primary) hypertension: Secondary | ICD-10-CM

## 2022-07-18 DIAGNOSIS — R079 Chest pain, unspecified: Secondary | ICD-10-CM

## 2022-07-18 MED ORDER — METOPROLOL TARTRATE 100 MG PO TABS
100.0000 mg | ORAL_TABLET | Freq: Once | ORAL | 0 refills | Status: DC
  Filled 2022-07-18: qty 1, 1d supply, fill #0

## 2022-07-18 NOTE — Progress Notes (Signed)
Cardiology Office Note:    Date:  07/18/2022   ID:  Gregory Sosa, DOB [birth year], MRN [id number]  PCP:  Delene Ruffini, MD   [place] Providers Cardiologist:  None     Referring MD: Sid Falcon, MD   Chief Complaint  Patient presents with   Consult    Chest pain  Gregory Sosa is a 62 y.o. male who is being seen today for the evaluation of chest pain at the request of Sid Falcon, MD.   History of Present Illness:    Gregory Sosa is a 62 y.o. male with a hx of poorly controlled type 2 diabetes mellitus, severe dyslipidemia, elevated LDL, low HDL), hypertension, GERD, elevated PSA, primary hyperparathyroidism, presents for evaluation of a few weeks after a prolonged episode of chest pain.  Gregory Sosa was on a trip to [place] when he began to develop palpitations and intermittent chest discomfort.  The symptoms are not related to physical activity.  Chest discomfort became much worse during the return home plane trip, which lasted 18 hours.  Pain was sharp and piercing located in the retrosternal area.  He does not recall whether it is worse with deep breaths or with coughs, but he believes it felt better when he was leaning forward.  After returning home his symptoms gradually abated.  He has not had any chest discomfort the last 2 weeks and does not have problems with shortness of breath.  He does not recall having swelling or pain in his leg at any point.  He did not have cough or hemoptysis, fever or chills.  He denies chest pain at rest or with activity, shortness of breath at rest or with activity, dizziness, syncope, claudication.  He has chronic erectile dysfunction that responds to sildenafil.  In the summer months he will mow his own lawn and the neighbors lawn with a push mower and does not recall having any issues with chest pain during that.  He also used to play singles tennis, but has not done so in a while.  Glycemic control has been very poor with a  recent hemoglobin A1c of 11.3%.  He has recently started on Soliqua (insulin glargine-lixisenatide).  His most recent lipid profile from a few months ago showed total cholesterol 296, HDL 29, LDL 178 and triglycerides 442.  Despite the fact that he has a prescription for rosuvastatin, he has not been taking it.  It does not sound like he has had clear side effects, although he may have had some muscle weakness at some point.  He was mostly discouraged from taking a statin because of his wife's concern for possible side effects.  He does not smoke cigarettes, but does vape.  His father had a heart attack probably in his 79s and lived to age 19.  His mother had also had a heart attack, has a pacemaker and is alive at age 51.  In 2012 he underwent cardiac catheterization for chest pain, finding no evidence of any coronary stenoses.  He was however markedly hypertensive and LVEDP was 22 mmHg.  He had an echocardiogram performed during that same admission showed normal left ventricular wall motion and overall systolic function and no significant valvular abnormalities.  Past Medical History:  Diagnosis Date   Diabetes mellitus type 2, uncontrolled 03/23/2011   Dyslipidemia 03/23/2011   21.5% 10-year risk of heart disease or stroke.      Elevated PSA    Elevated PSA, between 10 and less than 20  ng/ml 08/02/2012   GERD (gastroesophageal reflux disease) 10/09/2012   H/O atypical chest pain 03/23/2011   S/p clean cath on 03/01/11-no evidence of significant CAD.  Refused cardiology referral when cardiology attempted to make appointment.     Hypercalcemia    Hypertension    Vitamin D deficiency     History reviewed. No pertinent surgical history.  Current Medications: Current Meds  Medication Sig   Blood Glucose Monitoring Suppl (ONE TOUCH ULTRA SYSTEM KIT) W/DEVICE KIT Use to check sugar once to twice a day. Dx code: 250.00.   glucose blood (BAYER CONTOUR NEXT TEST) test strip Use as instructed    glucose blood (ONE TOUCH TEST STRIPS) test strip Use to check sugar once to twice a day. Dx code: 250.00.   Insulin Glargine-Lixisenatide (SOLIQUA) 100-33 UNT-MCG/ML SOPN Inject 15 Units into the skin daily.   Insulin Pen Needle (PEN NEEDLES) 32G X 4 MM MISC Use new needle with each injection   Lancets (ONETOUCH ULTRASOFT) lancets Use to check sugar once to twice a day. Dx code: 250.00.   metoprolol tartrate (LOPRESSOR) 100 MG tablet Take 1 tablet (100 mg total) by mouth once for 1 dose. PLEASE TAKE METOPROLOL 2  HOURS PRIOR TO CTA SCAN.   olmesartan (BENICAR) 20 MG tablet Take 1 tablet (20 mg total) by mouth daily.   sildenafil (VIAGRA) 50 MG tablet Take 1 tablet (50 mg total) by mouth as needed for erectile dysfunction.     Allergies:   Lisinopril and Metformin and related   Social History   Socioeconomic History   Marital status: Married    Spouse name: Not on file   Number of children: Not on file   Years of education: 16   Highest education level: Not on file  Occupational History   Not on file  Tobacco Use   Smoking status: Every Day    Packs/day: 0.50    Types: E-cigarettes, Cigarettes    Last attempt to quit: 02/06/2013    Years since quitting: 9.4   Smokeless tobacco: Never   Tobacco comments:    Vapes     Patient vape daily 07/18/2022  Vaping Use   Vaping Use: Never used  Substance and Sexual Activity   Alcohol use: Yes    Comment: Wine   Drug use: Never   Sexual activity: Yes  Other Topics Concern   Not on file  Social History Narrative   Not on file   Social Determinants of Health   Financial Resource Strain: Not on file  Food Insecurity: No Food Insecurity (07/07/2022)   Hunger Vital Sign    Worried About Running Out of Food in the Last Year: Never true    Ran Out of Food in the Last Year: Never true  Transportation Needs: No Transportation Needs (07/07/2022)   PRAPARE - Hydrologist (Medical): No    Lack of Transportation  (Non-Medical): No  Physical Activity: Not on file  Stress: Not on file  Social Connections: Moderately Integrated (07/07/2022)   Social Connection and Isolation Panel [NHANES]    Frequency of Communication with Friends and Family: More than three times a week    Frequency of Social Gatherings with Friends and Family: Once a week    Attends Religious Services: More than 4 times per year    Active Member of [employer] or Organizations: No    Attends Archivist Meetings: Never    Marital Status: Married     Family History:  The patient's family history includes Heart disease in his father.  Pacemaker in his mother  ROS:   Please see the history of present illness.     All other systems reviewed and are negative.  EKGs/Labs/Other Studies Reviewed:    The following studies were reviewed today: Cardiac catheterization 2012:  ON: 1. No angiographic evidence of any significant coronary artery disease     to explain the patient's chest pain, therefore symptoms are most     likely nonanginal in nature. 2. Severe hypertension with elevated end-diastolic pressure, although     the patient's blood pressure was relatively stable pre-cath. 3. Normal left ventricular ejection fraction with no wall motion     abnormalities. 4. EKG changes are most likely secondary to potentially left     ventricular hypertrophy given hypertension, but we will review     echocardiogram to determine if this is correct.  EKG:  EKG is ordered today.  The ekg ordered today demonstrates normal sinus rhythm, T wave flattening in leads V5-V6, normal QTc 424 ms.  Recent Labs: 02/04/2022: BUN 14; Creatinine, Ser 0.91; Potassium 4.9; Sodium 134  Recent Lipid Panel    Component Value Date/Time   CHOL 296 (H) 02/04/2022 1224   TRIG 442 (H) 02/04/2022 1224   HDL 29 (L) 02/04/2022 1224   CHOLHDL 10.2 (H) 02/04/2022 1224   CHOLHDL 4.5 04/14/2014 1558   VLDL 44 (H) 04/14/2014 1558   LDLCALC 178 (H) 02/04/2022 1224      Risk Assessment/Calculations:                Physical Exam:    VS:  BP 132/76 (BP Location: Left Arm, Patient Position: Sitting, Cuff Size: Normal)   Pulse 75   Ht '5\' 6"'$  (1.676 m)   Wt 168 lb 3.2 oz (76.3 kg)   SpO2 98%   BMI 27.15 kg/m     Wt Readings from Last 3 Encounters:  07/18/22 168 lb 3.2 oz (76.3 kg)  07/07/22 168 lb 12.8 oz (76.6 kg)  05/16/22 171 lb (77.6 kg)     GEN: Mildly overweight, well nourished, well developed in no acute distress HEENT: Normal NECK: No JVD; No carotid bruits LYMPHATICS: No lymphadenopathy CARDIAC: RRR, no murmurs, rubs, gallops RESPIRATORY:  Clear to auscultation without rales, wheezing or rhonchi  ABDOMEN: Soft, non-tender, non-distended MUSCULOSKELETAL:  No edema; No deformity  SKIN: Warm and dry NEUROLOGIC:  Alert and oriented x 3 PSYCHIATRIC:  Normal affect   ASSESSMENT:    1. Hypertension, unspecified type   2. Chest pain, unspecified type    PLAN:    In order of problems listed above:  Precordial pain: Symptoms sounded pleuritic rather than anginal, but he has a very rich array of coronary risk factors.  Will set up for coronary CT angiogram.  This test will have several advantages and still allow [place] to look at the ascending aorta for evidence of aneurysm or dissection and may be even catch remnants of what might have been a pulmonary embolism (although this is unlikely to still be present a month after his original symptoms).  The study will also give [place] a calcium score that can refine our prescription for lipid-lowering therapy.  He should not take ARB on the day of the scan.  He should not take sildenafil within 24 hours before the scan since he will receive nitroglycerin. HLP: Although his triglycerides may improve with better glycemic control, his HDL is likely to remain low.  Having said that  back in 2017-2018 his HDL was in the low 40s, may be due to more commitment to physical activity.  His LDL cholesterol is  severely elevated, as high as 204 when not treated.  Talked about the clear benefit that statins are for both primary and secondary prevention, in particular in patients with diabetes mellitus.  There is reason to be a little concerned about using higher doses of rosuvastatin since this may worsen glycemic control.  Nevertheless this should be the first step.  If we identify significant coronary artery calcification with or without stenoses we will be shooting for an LDL cholesterol < 70.  May need combination of statin and PCSK9 inhibitor to achieve that goal. HTN: Well-controlled.  ARB is a good choice for renal protection. ED: Do not take sildenafil for minimum of 24 hours before the CT angiogram, since he will be administered sublingual nitroglycerin.           Medication Adjustments/Labs and Tests Ordered: Current medicines are reviewed at length with the patient today.  Concerns regarding medicines are outlined above.  Orders Placed This Encounter  Procedures   CT CORONARY MORPH W/CTA COR W/SCORE W/CA W/CM &/OR WO/CM   Basic metabolic panel   EKG [id number]   Meds ordered this encounter  Medications   metoprolol tartrate (LOPRESSOR) 100 MG tablet    Sig: Take 1 tablet (100 mg total) by mouth once for 1 dose. PLEASE TAKE METOPROLOL 2  HOURS PRIOR TO CTA SCAN.    Dispense:  1 tablet    Refill:  0    Patient Instructions  Medication Instructions:  Take Metoprolol '100mg'$  2 hrs prior to CT *If you need a refill on your cardiac medications before your next appointment, please call your pharmacy*   Lab Work: BMET today If you have labs (blood work) drawn today and your tests are completely normal, you will receive your results only by: [place] (if you have MyChart) OR A paper copy in the mail If you have any lab test that is abnormal or we need to change your treatment, we will call you to review the results.   Testing/Procedures: Your physician has requested that you have  cardiac CT. Cardiac computed tomography (CT) is a painless test that uses an x-ray machine to take clear, detailed pictures of your heart. For further information please visit [email or web address]. Please follow instruction sheet as given.     Your cardiac CT will be scheduled at one of the below locations:   [facility] [address] [place], [place] [address] [phone]  If scheduled at [facility], please arrive at the [facility] and Children's Entrance (Entrance C2) of [facility] 30 minutes prior to test start time. You can use the FREE valet parking offered at entrance C (encouraged to control the heart rate for the test)  Proceed to the [facility] Radiology Department (first floor) to check-in and test prep.  All radiology patients and guests should use entrance C2 at [facility], accessed from [facility], even though the hospital's physical address listed is [address].    Please follow these instructions carefully (unless otherwise directed):  Hold all erectile dysfunction medications at least 3 days (72 hrs) prior to test. (Ie viagra, cialis, sildenafil, tadalafil, etc) We will administer nitroglycerin during this exam.   On the Night Before the Test: Be sure to Drink plenty of water. Do not consume any caffeinated/decaffeinated beverages or chocolate 12 hours prior  to your test. Do not take any antihistamines 12 hours prior to your test.  On the Day of the Test: Drink plenty of water until 1 hour prior to the test. Do not eat any food 1 hour prior to test. Do not take Olmesartan prior to your test Take metoprolol (Lopressor) two hours prior to test. If you take Furosemide/Hydrochlorothiazide/Spironolactone, please HOLD on the morning of the test.  After the Test: Drink plenty of water. After receiving IV contrast, you may experience a mild flushed feeling. This is normal. On occasion, you may experience a  mild rash up to 24 hours after the test. This is not dangerous. If this occurs, you can take Benadryl 25 mg and increase your fluid intake. If you experience trouble breathing, this can be serious. If it is severe call 911 IMMEDIATELY. If it is mild, please call our office. If you take any of these medications: Glipizide/Metformin, Avandament, Glucavance, please do not take 48 hours after completing test unless otherwise instructed.  We will call to schedule your test 2-4 weeks out understanding that some insurance companies will need an authorization prior to the service being performed.   For non-scheduling related questions, please contact the cardiac imaging nurse navigator should you have any questions/concerns: Marchia Bond, Cardiac Imaging Nurse Navigator Gordy Clement, Cardiac Imaging Nurse Navigator [facility] Direct Office Dial: [phone]   For scheduling needs, including cancellations and rescheduling, please call [place], [phone].    Follow-Up: At [facility], you and your health needs are our priority.  As part of our continuing mission to provide you with exceptional heart care, we have created designated Provider Care Teams.  These Care Teams include your primary Cardiologist (physician) and Advanced Practice Providers (APPs -  Physician Assistants and Nurse Practitioners) who all work together to provide you with the care you need, when you need it.  We recommend signing up for the patient portal called "MyChart".  Sign up information is provided on this After Visit Summary.  MyChart is used to connect with patients for Virtual Visits (Telemedicine).  Patients are able to view lab/test results, encounter notes, upcoming appointments, etc.  Non-urgent messages can be sent to your provider as well.   To learn more about what you can do with MyChart, go to [email or web address].    Your next appointment:   Follow up as needed with  Dr Sallyanne Kuster    Signed, Sanda Klein, MD  07/18/2022 5:04 PM    [place]

## 2022-07-18 NOTE — Patient Instructions (Signed)
Medication Instructions:  Take Metoprolol '100mg'$  2 hrs prior to CT *If you need a refill on your cardiac medications before your next appointment, please call your pharmacy*   Lab Work: BMET today If you have labs (blood work) drawn today and your tests are completely normal, you will receive your results only by: [place] (if you have MyChart) OR A paper copy in the mail If you have any lab test that is abnormal or we need to change your treatment, we will call you to review the results.   Testing/Procedures: Your physician has requested that you have cardiac CT. Cardiac computed tomography (CT) is a painless test that uses an x-ray machine to take clear, detailed pictures of your heart. For further information please visit [email or web address]. Please follow instruction sheet as given.     Your cardiac CT will be scheduled at one of the below locations:   [facility] [address] [place], [place] [address] [phone]  If scheduled at [facility], please arrive at the [facility] and Children's Entrance (Entrance C2) of [facility] 30 minutes prior to test start time. You can use the FREE valet parking offered at entrance C (encouraged to control the heart rate for the test)  Proceed to the [facility] Radiology Department (first floor) to check-in and test prep.  All radiology patients and guests should use entrance C2 at [facility], accessed from [facility], even though the hospital's physical address listed is [address]    Please follow these instructions carefully (unless otherwise directed):  Hold all erectile dysfunction medications at least 3 days (72 hrs) prior to test. (Ie viagra, cialis, sildenafil, tadalafil, etc) We will administer nitroglycerin during this exam.   On the Night Before the Test: Be sure to Drink plenty of water. Do not consume any caffeinated/decaffeinated beverages or chocolate  12 hours prior to your test. Do not take any antihistamines 12 hours prior to your test.  On the Day of the Test: Drink plenty of water until 1 hour prior to the test. Do not eat any food 1 hour prior to test. Do not take Olmesartan prior to your test Take metoprolol (Lopressor) two hours prior to test. If you take Furosemide/Hydrochlorothiazide/Spironolactone, please HOLD on the morning of the test.  After the Test: Drink plenty of water. After receiving IV contrast, you may experience a mild flushed feeling. This is normal. On occasion, you may experience a mild rash up to 24 hours after the test. This is not dangerous. If this occurs, you can take Benadryl 25 mg and increase your fluid intake. If you experience trouble breathing, this can be serious. If it is severe call 911 IMMEDIATELY. If it is mild, please call our office. If you take any of these medications: Glipizide/Metformin, Avandament, Glucavance, please do not take 48 hours after completing test unless otherwise instructed.  We will call to schedule your test 2-4 weeks out understanding that some insurance companies will need an authorization prior to the service being performed.   For non-scheduling related questions, please contact the cardiac imaging nurse navigator should you have any questions/concerns: Marchia Bond, Cardiac Imaging Nurse Navigator Gordy Clement, Cardiac Imaging Nurse Navigator [facility] Direct Office Dial: [phone]   For scheduling needs, including cancellations and rescheduling, please call [place], [phone].    Follow-Up: At [facility], you and your health needs are our priority.  As part of our continuing mission  to provide you with exceptional heart care, we have created designated Provider Care Teams.  These Care Teams include your primary Cardiologist (physician) and Advanced Practice Providers (APPs -  Physician Assistants and Nurse  Practitioners) who all work together to provide you with the care you need, when you need it.  We recommend signing up for the patient portal called "MyChart".  Sign up information is provided on this After Visit Summary.  MyChart is used to connect with patients for Virtual Visits (Telemedicine).  Patients are able to view lab/test results, encounter notes, upcoming appointments, etc.  Non-urgent messages can be sent to your provider as well.   To learn more about what you can do with MyChart, go to [email or web address].    Your next appointment:   Follow up as needed with Dr Sallyanne Kuster

## 2022-07-19 ENCOUNTER — Telehealth: Payer: Self-pay | Admitting: Cardiovascular Disease

## 2022-07-19 LAB — BASIC METABOLIC PANEL
BUN/Creatinine Ratio: 19 (ref 10–24)
BUN: 16 mg/dL (ref 8–27)
CO2: 19 mmol/L — ABNORMAL LOW (ref 20–29)
Calcium: 10.5 mg/dL — ABNORMAL HIGH (ref 8.6–10.2)
Chloride: 102 mmol/L (ref 96–106)
Creatinine, Ser: 0.84 mg/dL (ref 0.76–1.27)
Glucose: 230 mg/dL — ABNORMAL HIGH (ref 70–99)
Potassium: 4.3 mmol/L (ref 3.5–5.2)
Sodium: 138 mmol/L (ref 134–144)
eGFR: 99 mL/min/{1.73_m2} (ref >59)

## 2022-07-19 NOTE — Telephone Encounter (Signed)
Per Dr. Ulice Dash message to the patient,  As you are advised that you have a clinic visit, do not take the olmesartan on the morning of your coronary CT. It is also very important that you not take sildenafil for the 24 hours before the coronary CT, since it is standard to administer 1 dose of sublingual nitroglycerin to dilate the coronary arteries for better evaluation at the time of the CT.  The combination of sildenafil and nitroglycerin can cause serious drops in blood pressure.  I did inform patient he can also view the same message on his mychart.  He verbalized understanding.

## 2022-07-19 NOTE — Telephone Encounter (Signed)
Calling too see what medication he is not suppose to take for his CT. Please advise

## 2022-08-01 ENCOUNTER — Telehealth (HOSPITAL_COMMUNITY): Payer: Self-pay | Admitting: *Deleted

## 2022-08-01 NOTE — Telephone Encounter (Signed)
Reaching out to patient to offer assistance regarding upcoming cardiac imaging study; pt verbalizes understanding of appt date/time, parking situation and where to check in, pre-test NPO status and medications ordered, and verified current allergies; name and call back number provided for further questions should they arise  Jaleiah Asay RN Navigator Cardiac Imaging [facility] [phone] office [phone] cell  Patient to take 100mg metoprolol tartrate two hours prior to his cardiac CT scan. He is aware to arrive at 9am. 

## 2022-08-02 ENCOUNTER — Ambulatory Visit (HOSPITAL_COMMUNITY)
Admission: RE | Admit: 2022-08-02 | Discharge: 2022-08-02 | Disposition: A | Discharge status: Home / Self Care | Payer: 59 | Source: Ambulatory Visit | Attending: Cardiovascular Disease | Admitting: Cardiovascular Disease

## 2022-08-02 DIAGNOSIS — I700 Atherosclerosis of aorta: Secondary | ICD-10-CM | POA: Diagnosis not present

## 2022-08-02 DIAGNOSIS — R079 Chest pain, unspecified: Secondary | ICD-10-CM | POA: Insufficient documentation

## 2022-08-02 MED ORDER — METOPROLOL TARTRATE 5 MG/5ML IV SOLN
INTRAVENOUS | Status: AC
  Administered 2022-08-02: 10 mg via INTRAVENOUS
  Filled 2022-08-02: qty 10

## 2022-08-02 MED ORDER — NITROGLYCERIN 0.4 MG SL SUBL
0.8000 mg | SUBLINGUAL_TABLET | Freq: Once | SUBLINGUAL | Status: AC
  Administered 2022-08-02: 0.8 mg via SUBLINGUAL

## 2022-08-02 MED ORDER — IOHEXOL 350 MG/ML SOLN
100.0000 mL | Freq: Once | INTRAVENOUS | Status: AC | PRN
  Administered 2022-08-02: 100 mL via INTRAVENOUS

## 2022-08-02 MED ORDER — METOPROLOL TARTRATE 5 MG/5ML IV SOLN: 10.0000 mg | Freq: Once | INTRAVENOUS | Status: AC

## 2022-08-02 MED ORDER — NITROGLYCERIN 0.4 MG SL SUBL
SUBLINGUAL_TABLET | SUBLINGUAL | Status: AC
  Filled 2022-08-02: qty 2

## 2022-08-09 ENCOUNTER — Telehealth: Payer: Self-pay

## 2022-08-09 NOTE — Telephone Encounter (Signed)
Patient called he stated he is currently taking Insulin Glargine-Lixisenatide  he stated the insulin isn't working he is requesting to be switched back to insulin glargine (LANTUS) please return patient call.

## 2022-08-11 ENCOUNTER — Other Ambulatory Visit (HOSPITAL_COMMUNITY): Payer: Self-pay

## 2022-08-11 MED ORDER — RYBELSUS 14 MG PO TABS
14.0000 mg | ORAL_TABLET | Freq: Every day | ORAL | 1 refills | Status: DC
  Filled 2022-08-11: qty 30, 30d supply, fill #0
  Filled 2022-09-12: qty 90, 90d supply, fill #0
  Filled 2022-09-12: qty 30, 30d supply, fill #0
  Filled 2022-10-10: qty 30, 30d supply, fill #1
  Filled 2022-11-10: qty 30, 30d supply, fill #2
  Filled 2022-12-15: qty 30, 30d supply, fill #3

## 2022-08-11 MED ORDER — INSULIN GLARGINE 100 UNIT/ML SOLOSTAR PEN
PEN_INJECTOR | SUBCUTANEOUS | 3 refills | Status: DC
  Filled 2022-08-11: qty 6, 30d supply, fill #0
  Filled 2022-09-12 (×2): qty 6, 30d supply, fill #1

## 2022-08-11 NOTE — Addendum Note (Signed)
Addended by: Riesa Pope on: 08/11/2022 05:33 PM   Modules accepted: Orders

## 2022-08-11 NOTE — Telephone Encounter (Signed)
Gregory Sosa called in and wanted to discuss altering his diabetic regimen. He was recently changed from rybelsus and lantus to [place]. He feels his glucose levels remain elevated in the 200-300 despite this and that he did better on the lantus/rybelsus individually. He would not like to go up on the [place] but go back to lantus and rybelsus.   With him still having elevated glucose levels on the lantus, instructed him to go up to 20 U and continue rybelsus. He would benefit from a CGM and recommended he follow up in 2-4 weeks to discuss how his glucose levels are doing and discussion about starting CGM monitoring. Instructed him to call if glucose levels persistently elevated or begin to drop low

## 2022-08-11 NOTE — Telephone Encounter (Signed)
Please address this message. Patient is calling back he is still waiting for someone to contact him.

## 2022-08-12 ENCOUNTER — Other Ambulatory Visit (HOSPITAL_COMMUNITY): Payer: Self-pay

## 2022-08-18 ENCOUNTER — Other Ambulatory Visit (HOSPITAL_COMMUNITY): Payer: Self-pay

## 2022-09-12 ENCOUNTER — Other Ambulatory Visit (HOSPITAL_COMMUNITY): Payer: Self-pay

## 2022-09-12 ENCOUNTER — Other Ambulatory Visit: Payer: Self-pay | Admitting: Student

## 2022-09-12 ENCOUNTER — Other Ambulatory Visit: Payer: Self-pay

## 2022-09-12 MED ORDER — GLUCOSE BLOOD VI STRP
ORAL_STRIP | 12 refills | Status: AC
  Filled 2022-09-12: qty 100, 30d supply, fill #0
  Filled 2022-09-12: qty 50, 30d supply, fill #0
  Filled 2022-10-26: qty 50, 30d supply, fill #1
  Filled 2022-12-26: qty 50, 30d supply, fill #2
  Filled 2023-05-29: qty 50, 30d supply, fill #3
  Filled 2023-08-04: qty 50, 30d supply, fill #4

## 2022-09-12 NOTE — Telephone Encounter (Signed)
Next appt scheduled 5/9 with Dr Liang. 

## 2022-09-12 NOTE — Telephone Encounter (Signed)
  glucose blood (BAYER CONTOUR NEXT TEST) test strip    [facility] [place], [place] - [address] [place] (Ph: [phone])

## 2022-09-13 ENCOUNTER — Other Ambulatory Visit (HOSPITAL_COMMUNITY): Payer: Self-pay

## 2022-09-13 ENCOUNTER — Other Ambulatory Visit: Payer: Self-pay

## 2022-09-13 ENCOUNTER — Other Ambulatory Visit: Payer: Self-pay | Admitting: Internal Medicine

## 2022-09-13 DIAGNOSIS — E785 Hyperlipidemia, unspecified: Secondary | ICD-10-CM

## 2022-09-14 ENCOUNTER — Other Ambulatory Visit (HOSPITAL_COMMUNITY): Payer: Self-pay

## 2022-09-14 MED ORDER — ROSUVASTATIN CALCIUM 20 MG PO TABS
20.0000 mg | ORAL_TABLET | Freq: Every day | ORAL | 2 refills | Status: DC
  Filled 2022-09-14: qty 30, 30d supply, fill #0
  Filled 2022-10-10: qty 30, 30d supply, fill #1
  Filled 2022-11-13: qty 30, 30d supply, fill #2

## 2022-09-15 ENCOUNTER — Ambulatory Visit: Payer: 59 | Admitting: Student

## 2022-09-15 ENCOUNTER — Other Ambulatory Visit: Payer: Self-pay

## 2022-09-15 ENCOUNTER — Other Ambulatory Visit (HOSPITAL_COMMUNITY): Payer: Self-pay

## 2022-09-15 ENCOUNTER — Encounter: Payer: Self-pay | Admitting: Student

## 2022-09-15 VITALS — BP 120/72 | HR 83 | Temp 97.5°F | Ht 66.0 in | Wt 171.1 lb

## 2022-09-15 DIAGNOSIS — E119 Type 2 diabetes mellitus without complications: Secondary | ICD-10-CM | POA: Diagnosis not present

## 2022-09-15 DIAGNOSIS — R079 Chest pain, unspecified: Secondary | ICD-10-CM | POA: Diagnosis not present

## 2022-09-15 DIAGNOSIS — Z794 Long term (current) use of insulin: Secondary | ICD-10-CM

## 2022-09-15 DIAGNOSIS — Z1211 Encounter for screening for malignant neoplasm of colon: Secondary | ICD-10-CM

## 2022-09-15 DIAGNOSIS — I10 Essential (primary) hypertension: Secondary | ICD-10-CM | POA: Diagnosis not present

## 2022-09-15 DIAGNOSIS — E785 Hyperlipidemia, unspecified: Secondary | ICD-10-CM

## 2022-09-15 DIAGNOSIS — E782 Mixed hyperlipidemia: Secondary | ICD-10-CM

## 2022-09-15 LAB — GLUCOSE, CAPILLARY: Glucose-Capillary: 273 mg/dL — ABNORMAL HIGH (ref 70–99)

## 2022-09-15 LAB — POCT GLYCOSYLATED HEMOGLOBIN (HGB A1C): Hemoglobin A1C: 10.3 % — AB (ref 4.0–5.6)

## 2022-09-15 MED ORDER — INSULIN GLARGINE 100 UNIT/ML SOLOSTAR PEN
20.0000 [IU] | PEN_INJECTOR | Freq: Every day | SUBCUTANEOUS | 3 refills | Status: DC
  Filled 2022-09-15: qty 15, 75d supply, fill #0
  Filled 2022-10-10: qty 6, 30d supply, fill #0
  Filled 2022-11-06: qty 6, 30d supply, fill #1
  Filled 2022-12-01: qty 6, 30d supply, fill #2
  Filled 2022-12-26: qty 6, 30d supply, fill #3

## 2022-09-15 NOTE — Assessment & Plan Note (Signed)
BP well-controlled on olmesartan 20 mg daily.  Has been taking this daily for about a month.  Continue on current dosage of olmesartan.  BMP today now that he is regularly taking ARB.

## 2022-09-15 NOTE — Assessment & Plan Note (Signed)
Has recently started taking rosuvastatin 20 mg daily.  Doing well with this without side effects.  Check lipid panel at next visit.

## 2022-09-15 NOTE — Assessment & Plan Note (Addendum)
Cardiology visit for this in March, underwent CT coronary study with coronary calcium score of 0.  Mild nonobstructive CAD.  Glycemic control and stain  prevent microvascular disease.

## 2022-09-15 NOTE — Assessment & Plan Note (Signed)
A1c today 10.3% down from 11.3% at last check.  Had tried [place] without much improvement and switch back to Lantus and Rybelsus.  Currently on 20 units of glargine and 14 mg of Rybelsus.  Soliqua due to lack of improvement in fasting blood sugars.  Meter downloaded today average fasting glucose of 275.  Overall poor glycemic control on current regimen.  Unable to tolerate metformin or an history of euglycemic DKA with SGLT2.  Discussed that likely needs mealtime insulin to attain better glycemic control.  Will refer him to diabetic coordinator for CGM placement to help with planning for this.  Patient understands and motivated to improve his glycemic control today.  Increase glargine to 25 mg daily, continue Rybelsus referral to diabetic coordinator Patient will follow-up with ophthalmology

## 2022-09-15 NOTE — Patient Instructions (Addendum)
It was a pleasure seeing you in clinic today  You fasting glucoses are elevated  Please increase glargine to 25 units daily and continue rybelsus. It is important to have good glucose control to prevent events like heart attacks, strokes, kidney disease, and eye disease  I have made a recommendation for you be seen by out diabetic coordinator for continuous glucose monitoring to help [place] with you insulin management. You will likely need meal time insulin to control your blood sugar.   Please continue rosuvastatin and olmesartan  Make an appointment with your eye doctor  Follow up in 3 months

## 2022-09-15 NOTE — Assessment & Plan Note (Signed)
Fit test provided to patient today.

## 2022-09-15 NOTE — Progress Notes (Signed)
Established Patient Office Visit  Subjective   Patient ID: Gregory Sosa, male    DOB: 08-09-1960  Age: 62 y.o. MRN: [id number]  Chief Complaint  Patient presents with   Follow-up    ROUTINE OFFICE VISIT     Gregory Sosa is a 62 y.o. person living with a history listed below who presents to clinic for follow up of T2DM. Please refer to problem based charting for further details and assessment and plan of current problem and chronic medical conditions.     Patient Active Problem List   Diagnosis Date Noted   Chest pain 06/23/2022   Patient non adherence 05/16/2022   Erectile dysfunction 02/06/2022   Colon cancer screening 03/03/2017   Primary hyperparathyroidism (HCC) 10/13/2014   Hypertension 03/23/2011   Diabetes (HCC) 03/23/2011   Hyperlipidemia 03/23/2011   ROS: negative as per HPI     Objective:     BP 120/72 (BP Location: Left Arm, Patient Position: Sitting, Cuff Size: Normal)   Pulse 83   Temp (!) 97.5 F (36.4 C) (Oral)   Ht 5\' 6"  (1.676 m)   Wt 171 lb 1.6 oz (77.6 kg)   SpO2 100%   BMI 27.62 kg/m  BP Readings from Last 3 Encounters:  09/15/22 120/72  08/02/22 127/74  07/18/22 132/76      Physical Exam Constitutional:      Appearance: Normal appearance.  HENT:     Mouth/Throat:     Mouth: Mucous membranes are moist.     Pharynx: Oropharynx is clear.  Eyes:     Extraocular Movements: Extraocular movements intact.     Conjunctiva/sclera: Conjunctivae normal.     Pupils: Pupils are equal, round, and reactive to light.  Cardiovascular:     Rate and Rhythm: Normal rate and regular rhythm.     Heart sounds: No murmur heard. Pulmonary:     Effort: Pulmonary effort is normal.     Breath sounds: No rhonchi or rales.  Abdominal:     General: Abdomen is flat. Bowel sounds are normal. There is no distension.     Palpations: Abdomen is soft.     Tenderness: There is no abdominal tenderness.  Musculoskeletal:        General: Normal range of motion.      Right lower leg: No edema.     Left lower leg: No edema.  Skin:    General: Skin is warm and dry.     Capillary Refill: Capillary refill takes less than 2 seconds.  Neurological:     General: No focal deficit present.     Mental Status: He is alert and oriented to person, place, and time.  Psychiatric:        Mood and Affect: Mood normal.        Behavior: Behavior normal.      Results for orders placed or performed in visit on 09/15/22  Glucose, capillary  Result Value Ref Range   Glucose-Capillary 273 (H) 70 - 99 mg/dL  POC Hbg W0J  Result Value Ref Range   Hemoglobin A1C 10.3 (A) 4.0 - 5.6 %   HbA1c POC (<> result, manual entry)     HbA1c, POC (prediabetic range)     HbA1c, POC (controlled diabetic range)         The 10-year ASCVD risk score (Arnett DK, et al., 2019) is: 44.1%    Assessment & Plan:   Problem List Items Addressed This Visit     Hypertension    BP  well-controlled on olmesartan 20 mg daily.  Has been taking this daily for about a month.  Continue on current dosage of olmesartan.  BMP today now that he is regularly taking ARB.      Relevant Orders   BMP8+Anion Gap   Diabetes (HCC) - Primary    A1c today 10.3% down from 11.3% at last check.  Had tried [place] without much improvement and switch back to Lantus and Rybelsus.  Currently on 20 units of glargine and 14 mg of Rybelsus.  Soliqua due to lack of improvement in fasting blood sugars.  Meter downloaded today average fasting glucose of 275.  Overall poor glycemic control on current regimen.  Unable to tolerate metformin or an history of euglycemic DKA with SGLT2.  Discussed that likely needs mealtime insulin to attain better glycemic control.  Will refer him to diabetic coordinator for CGM placement to help with planning for this.  Patient understands and motivated to improve his glycemic control today.  Increase glargine to 25 mg daily, continue Rybelsus referral to diabetic coordinator Patient will  follow-up with ophthalmology      Relevant Medications   insulin glargine (LANTUS) 100 UNIT/ML Solostar Pen   Other Relevant Orders   POC Hbg A1C (Completed)   Referral to Nutrition and Diabetes Services   Hyperlipidemia    Has recently started taking rosuvastatin 20 mg daily.  Doing well with this without side effects.  Check lipid panel at next visit.      Colon cancer screening    Fit test provided to patient today.      Relevant Orders   Fecal occult blood, imunochemical   Chest pain    Cardiology visit for this in March, underwent CT coronary study with coronary calcium score of 0.  Mild nonobstructive CAD.  Glycemic control and stain  prevent microvascular disease.       Return in about 3 months (around 12/16/2022).    Quincy Simmonds, MD

## 2022-09-16 NOTE — Progress Notes (Signed)
Internal Medicine Clinic Attending ? ?Case discussed with Dr. Liang  At the time of the visit.  We reviewed the resident?s history and exam and pertinent patient test results.  I agree with the assessment, diagnosis, and plan of care documented in the resident?s note. ? ?

## 2022-09-17 LAB — BMP8+ANION GAP
Anion Gap: 17 mmol/L (ref 10.0–18.0)
BUN/Creatinine Ratio: 19 (ref 10–24)
BUN: 16 mg/dL (ref 8–27)
CO2: 18 mmol/L — ABNORMAL LOW (ref 20–29)
Calcium: 10.7 mg/dL — ABNORMAL HIGH (ref 8.6–10.2)
Chloride: 100 mmol/L (ref 96–106)
Creatinine, Ser: 0.86 mg/dL (ref 0.76–1.27)
Glucose: 249 mg/dL — ABNORMAL HIGH (ref 70–99)
Potassium: 4.4 mmol/L (ref 3.5–5.2)
Sodium: 135 mmol/L (ref 134–144)
eGFR: 99 mL/min/{1.73_m2} (ref >59)

## 2022-09-20 ENCOUNTER — Other Ambulatory Visit (HOSPITAL_COMMUNITY): Payer: Self-pay

## 2022-09-30 ENCOUNTER — Other Ambulatory Visit (HOSPITAL_COMMUNITY): Payer: Self-pay

## 2022-10-10 ENCOUNTER — Other Ambulatory Visit (HOSPITAL_COMMUNITY): Payer: Self-pay

## 2022-10-11 ENCOUNTER — Other Ambulatory Visit: Payer: Self-pay

## 2022-10-26 ENCOUNTER — Other Ambulatory Visit (HOSPITAL_COMMUNITY): Payer: Self-pay

## 2022-11-02 ENCOUNTER — Ambulatory Visit: Payer: 59 | Admitting: Dietician

## 2022-11-02 DIAGNOSIS — E119 Type 2 diabetes mellitus without complications: Secondary | ICD-10-CM | POA: Diagnosis not present

## 2022-11-02 NOTE — Progress Notes (Signed)
Diabetes Self-Management Education  Visit Type: First/Initial  Appt. Start Time: 218 Appt. End Time: 255  11/02/2022  [name], identified by name and date of birth, is a 62 y.o. male with a diagnosis of Diabetes: Type 2 (both mother and father have diabetes).   ASSESSMENT  Total daily dose ~ 40 units/day, current on 20 units, consider prandial insulin. He would be a good candidate for the Cecur simplicity patch.   His weight is at a healthy level.   His blood sugars have been elevtaed for some time per her a1cs.  Estimated body mass index is 27.62 kg/m as calculated from the following:   Height as of 09/15/22: 5\' 6"  (1.676 m).   Weight as of 09/15/22: 171 lb 1.6 oz (77.6 kg).  Wt Readings from Last 10 Encounters:  09/15/22 171 lb 1.6 oz (77.6 kg)  07/18/22 168 lb 3.2 oz (76.3 kg)  07/07/22 168 lb 12.8 oz (76.6 kg)  05/16/22 171 lb (77.6 kg)  03/21/22 175 lb 6.4 oz (79.6 kg)  03/07/22 173 lb 4.8 oz (78.6 kg)  02/04/22 164 lb 4.8 oz (74.5 kg)  06/02/20 179 lb 12.8 oz (81.6 kg)  04/06/20 178 lb 9.6 oz (81 kg)  03/03/20 182 lb 3.2 oz (82.6 kg)   Lab Results  Component Value Date   HGBA1C 10.3 (A) 09/15/2022   HGBA1C 11.3 (A) 05/16/2022   HGBA1C 11.6 (A) 02/04/2022   HGBA1C 11.6 (A) 06/02/2020   HGBA1C 9.2 (A) 02/12/2020   Lipid Panel     Component Value Date/Time   CHOL 296 (H) 02/04/2022 1224   TRIG 442 (H) 02/04/2022 1224   HDL 29 (L) 02/04/2022 1224   CHOLHDL 10.2 (H) 02/04/2022 1224   CHOLHDL 4.5 04/14/2014 1558   VLDL 44 (H) 04/14/2014 1558   LDLCALC 178 (H) 02/04/2022 1224   LABVLDL 89 (H) 02/04/2022 1224   Suggest repeating lipids after diabetes is controlled. BP Readings from Last 3 Encounters:  09/15/22 120/72  08/02/22 127/74  07/18/22 132/76   Blood pressure well controlled    Diabetes Self-Management Education - 11/02/22 1500       Visit Information   Visit Type First/Initial      Initial Visit   Diabetes Type Type 2   both mother and  father have diabetes   Date Diagnosed 2012    Are you currently following a meal plan? No    Are you taking your medications as prescribed? Yes      Health Coping   How would you rate your overall health? --   deferred to next visit     Psychosocial Assessment   Patient Belief/Attitude about Diabetes Motivated to manage diabetes    What is the hardest part about your diabetes right now, causing you the most concern, or is the most worrisome to you about your diabetes?   Checking blood sugar    Self-care barriers None;Lack of material resources    Self-management support Family;Doctor's office;CDE visits    Patient Concerns Monitoring;Glycemic Control    Special Needs None    Preferred Learning Style No preference indicated    Learning Readiness Ready    How often do you need to have someone help you when you read instructions, pamphlets, or other written materials from your doctor or pharmacy? 2 - Rarely    What is the last grade level you completed in school? 16      Pre-Education Assessment   Patient understands the diabetes disease and  treatment process. Comprehends key points    Patient understands incorporating nutritional management into lifestyle. Needs Review    Patient undertands incorporating physical activity into lifestyle. --   need to assess at future visit   Patient understands using medications safely. Comprehends key points    Patient understands monitoring blood glucose, interpreting and using results Needs Instruction    Patient understands prevention, detection, and treatment of acute complications. Needs Review    Patient understands prevention, detection, and treatment of chronic complications. --   need to assess at future visit   Patient understands how to develop strategies to address psychosocial issues. Comprehends key points    Patient understands how to develop strategies to promote health/change behavior. Comprehends key points      Complications   Last  HgB A1C per patient/outside source 10.3 %    How often do you check your blood sugar? 1-2 times/day    Fasting Blood glucose range (mg/dL) >161    Postprandial Blood glucose range (mg/dL) 096-045;>409    Number of hypoglycemic episodes per month 0    Number of hyperglycemic episodes ( >200mg /dL): Daily    Can you tell when your blood sugar is high? No    Have you had a dilated eye exam in the past 12 months? No    Have you had a dental exam in the past 12 months? No    Are you checking your feet? --   deferred     Dietary Intake   Breakfast deferred for today due to time constraints      Activity / Exercise   Activity / Exercise Type ADL's;Light (walking / raking leaves)   he walks at work   How many days per week do you exercise? 5    How many minutes per day do you exercise? 60    Total minutes per week of exercise 300      Patient Education   Previous Diabetes Education Yes (please comment)   he did not say, but that his wife is a Manufacturing engineer Eating Role of diet in the treatment of diabetes and the relationship between the three main macronutrients and blood glucose level    Monitoring Taught/evaluated CGM (comment)   gave him a free sample of dexcom G7 today. he agreed to cal if he wants a prescription prior to his 2 week follow up. he is using his phone as the reader.     Individualized Goals (developed by patient)   Monitoring  Consistenly use CGM      Post-Education Assessment   Patient understands incorporating nutritional management into lifestyle. Comprehends key points    Patient understands monitoring blood glucose, interpreting and using results Comprehends key points      Outcomes   Expected Outcomes Demonstrated interest in learning. Expect positive outcomes    Future DMSE 2 wks    Program Status Completed             Individualized Plan for Diabetes Self-Management Training:   Learning Objective:  Patient will have a greater understanding of diabetes  self-management. Patient education plan is to attend individual and/or group sessions per assessed needs and concerns.   Plan:   There are no Patient Instructions on file for this visit.  Expected Outcomes:  Demonstrated interest in learning. Expect positive outcomes  Education material provided: Diabetes Resources  If problems or questions, patient to contact team via:  Phone  Future DSME appointment: 2 wks He was in a  hurry to leave today, so visit kept brief.Lupita Leash Kamdin Follett, RD 11/02/2022 3:28 PM.

## 2022-11-07 LAB — HM DIABETES EYE EXAM

## 2022-11-14 ENCOUNTER — Telehealth: Payer: Self-pay | Admitting: Dietician

## 2022-11-14 NOTE — Telephone Encounter (Signed)
Moved appointment to 4 PM on same day.

## 2022-11-16 ENCOUNTER — Other Ambulatory Visit: Payer: Self-pay | Admitting: Dietician

## 2022-11-16 ENCOUNTER — Other Ambulatory Visit (HOSPITAL_COMMUNITY): Payer: Self-pay

## 2022-11-16 ENCOUNTER — Ambulatory Visit: Payer: 59 | Admitting: Dietician

## 2022-11-16 DIAGNOSIS — E119 Type 2 diabetes mellitus without complications: Secondary | ICD-10-CM | POA: Diagnosis not present

## 2022-11-16 MED ORDER — DEXCOM G7 SENSOR MISC
11 refills | Status: DC
  Filled 2022-11-16: qty 3, 30d supply, fill #0
  Filled 2023-01-22: qty 3, 30d supply, fill #1
  Filled 2023-03-15: qty 3, 30d supply, fill #2
  Filled 2023-07-19: qty 3, 30d supply, fill #3
  Filled 2023-09-21: qty 3, 30d supply, fill #4
  Filled 2023-11-13: qty 3, 30d supply, fill #5

## 2022-11-16 NOTE — Progress Notes (Signed)
Diabetes Self-Management Education  Visit Type: Follow-up (#1 after initial)  Appt. Start Time: 1600 Appt. End Time: 1645  11/16/2022  [name], identified by name and date of birth, is a 62 y.o. male with a diagnosis of Diabetes:  .   ASSESSMENT  His blood sugars are elevated most days 100%. Only 1 day did his blood sugar reach target range. His diet was reviewed and is reasonable, his weight is appropriate, his activity is reasonable and he takes his diabetes medicines daily. He wanted to try using increased activity to lower his blood sugars and make a few food modifications, but we also discussed that there is the possibility that his medicine needs to be adjusted.  He asks about stress increasing blood sugars and plan to discuss this in more detail at next visit. Also wonder if elevated lipids are causing insulin resistance.   CGM Results from download:  Only 5 days of data from June 27 to July 1  % Time CGM active:   36 %   (Goal >70%)  Average glucose:   277 mg/dL for 5 days  Glucose management indicator:   - %  Time in range (70-180 mg/dL):   5 %   (Goal >16%)  Time High (181-250 mg/dL):   25 %   (Goal < 10%)  Time Very High (>250 mg/dL):    70 %   (Goal < 5%)  Time Low (54-69 mg/dL):   - %   (Goal <9%)  Time Very Low (<54 mg/dL):   - %   (Goal <6%)  Coefficient of variation:   - %   (Goal <36%)      Diabetes Self-Management Education - 11/16/22 1600       Visit Information   Visit Type Follow-up   #1 after initial     Pre-Education Assessment   Patient undertands incorporating physical activity into lifestyle. Needs Review      Dietary Intake   Breakfast oatmeal 7 AM 2 eggs and 3 strips bacon or 1.5 cups home cooked with butter and 2 tsp sugar, coffee x2 with 2-3 teaspoons of sugar    Lunch 12- 2 PM from [employer]- meat (beef or fish, chicken sometimes fried) and 1 vegetables,has potatoes 2x/weekwater with lemon    Dinner 730-830 Pm at home: meat and 1  vegetable with 2-3 beers 12 ounces,    Snack (evening) 7-8 peanut butter crackers homemade    Beverage(s) water, coffee, beer      Activity / Exercise   Activity / Exercise Type ADL's;Light (walking / raking leaves)   mows lawns 2 times a week and gets 10K to 19k steps each time, tracks on his fit bit   How many days per week do you exercise? 2    How many minutes per day do you exercise? 120    Total minutes per week of exercise 240      Patient Education   Previous Diabetes Education Yes (please comment)    Being Active Identified with patient nutritional and/or medication changes necessary with exercise.    Acute complications Discussed and identified patients' prevention, symptoms, and treatment of hyperglycemia.    Lifestyle and Health Coping Helped patient develop diabetes management plan for (enter comment)   lowering blood sugars     Individualized Goals (developed by patient)   Physical Activity Exercise 3-5 times per week;15 minutes per day      Patient Self-Evaluation of Goals - Patient rates self as meeting previously  set goals (% of time)   Monitoring 50 - 75 % (half of the time)   his sensor fell off     Post-Education Assessment   Patient undertands incorporating physical activity into lifestyle. Comprehends key points    Patient understands prevention, detection, and treatment of acute complications. Comprehends key points      Outcomes   Expected Outcomes Demonstrated interest in learning. Expect positive outcomes    Future DMSE 4-6 wks    Program Status Completed   the plan was not previously completed because the assessment was not completed     Subsequent Visit   Since your last visit have you continued or begun to take your medications as prescribed? Yes    Since your last visit have you had your blood pressure checked? No    Since your last visit have you experienced any weight changes? --   he deferred weighing today as weight is not an issue   Since your  last visit, are you checking your blood glucose at least once a day? Yes   his Dexcom sensor fell off after 4 days and he is getting another mailed to him. He would like a prescription to see if his insurance will cover it            Individualized Plan for Diabetes Self-Management Training:   Learning Objective:  Patient will have a greater understanding of diabetes self-management. Patient education plan is to attend individual and/or group sessions per assessed needs and concerns.   Plan:   Patient Instructions  Possible changes to diet are: less sugar in coffee  and try a different snack at night like nuts, berries and greek yogurt   You said you are going to move Lantus to morning to help you take it more consistently   Goal to lower blood sugar is to : Jog or walk 3 days a week for 15 minutes for month.    Let's follow up in 4 weeks.   Lupita Leash [phone]   Expected Outcomes:  Demonstrated interest in learning. Expect positive outcomes  Education material provided: Diabetes Resources  If problems or questions, patient to contact team via:  Phone and Email  Future DSME appointment: 4-6 wks Norm Parcel, RD 11/16/2022 5:09 PM.

## 2022-11-16 NOTE — Telephone Encounter (Signed)
Prescription request

## 2022-11-16 NOTE — Patient Instructions (Addendum)
Possible changes to diet are: less sugar in coffee  and try a different snack at night like nuts, berries and greek yogurt   You said you are going to move Lantus to morning to help you take it more consistently   Goal to lower blood sugar is to : Jog or walk 3 days a week for 15 minutes for month.    Let's follow up in 4 weeks.   Lupita Leash [phone]

## 2022-11-21 ENCOUNTER — Other Ambulatory Visit (HOSPITAL_COMMUNITY): Payer: Self-pay

## 2022-11-21 MED ORDER — AMOXICILLIN 500 MG PO CAPS
500.0000 mg | ORAL_CAPSULE | Freq: Three times a day (TID) | ORAL | 0 refills | Status: DC
  Filled 2022-11-21: qty 30, 10d supply, fill #0

## 2022-11-25 ENCOUNTER — Encounter: Payer: Self-pay | Admitting: Dietician

## 2022-11-29 ENCOUNTER — Other Ambulatory Visit (HOSPITAL_COMMUNITY): Payer: Self-pay

## 2022-11-29 ENCOUNTER — Telehealth: Payer: Self-pay | Admitting: Dietician

## 2022-11-29 ENCOUNTER — Encounter: Payer: 59 | Admitting: Dietician

## 2022-11-29 NOTE — Telephone Encounter (Signed)
Patient calls and says pharmacy needs approval from Dr. Sherrilee Gilles before they can dispense his Dexcom G7 sensors. Call to pharmacy;  Call to [facility] rx PA line, [phone], [facility],  Case # [id number] pending for reviewed in 1-4 days. We will be Notified by fax

## 2022-11-29 NOTE — Telephone Encounter (Signed)
Will do!

## 2022-12-01 ENCOUNTER — Other Ambulatory Visit (HOSPITAL_COMMUNITY): Payer: Self-pay

## 2022-12-01 NOTE — Telephone Encounter (Signed)
Decision:Approved Gregory Sosa (Key: Lucianne Muss) PA Case ID #: [id number] Rx #: [id number] Need Help? Call us at [phone] Outcome Approved today Request Reference Number: [id number]. DEXCOM G7 MIS SENSOR is approved through 12/01/2023. Your patient may now fill this prescription and it will be covered. Authorization Expiration Date: 12/01/2023 Drug Dexcom G7 Sensor ePA cloud logo Form [facility] Electronic Prior Authorization Form [id number] NCPDP) Original Claim Info 75

## 2022-12-01 NOTE — Telephone Encounter (Signed)
Prior Authorization was resubmitted with last office notes and labs awaiting approval or denial.  ZOX:[id number]

## 2022-12-01 NOTE — Telephone Encounter (Signed)
Thank you! Patient notified

## 2022-12-06 ENCOUNTER — Other Ambulatory Visit: Payer: Self-pay | Admitting: Student

## 2022-12-06 ENCOUNTER — Other Ambulatory Visit (HOSPITAL_COMMUNITY): Payer: Self-pay

## 2022-12-06 DIAGNOSIS — N529 Male erectile dysfunction, unspecified: Secondary | ICD-10-CM

## 2022-12-06 MED ORDER — SILDENAFIL CITRATE 50 MG PO TABS
50.0000 mg | ORAL_TABLET | ORAL | 1 refills | Status: DC | PRN
  Filled 2022-12-06: qty 20, 20d supply, fill #0
  Filled 2023-02-02: qty 20, 20d supply, fill #1

## 2022-12-07 ENCOUNTER — Other Ambulatory Visit (HOSPITAL_COMMUNITY): Payer: Self-pay

## 2022-12-19 ENCOUNTER — Encounter: Payer: Self-pay | Admitting: Dietician

## 2022-12-19 ENCOUNTER — Ambulatory Visit: Payer: 59 | Admitting: Dietician

## 2022-12-19 VITALS — Wt 172.0 lb

## 2022-12-19 DIAGNOSIS — E119 Type 2 diabetes mellitus without complications: Secondary | ICD-10-CM

## 2022-12-19 DIAGNOSIS — Z794 Long term (current) use of insulin: Secondary | ICD-10-CM | POA: Diagnosis not present

## 2022-12-19 NOTE — Progress Notes (Signed)
Diabetes Self-Management Education  Visit Type: Follow-up (#2 after initial)  Appt. Start Time: 1515 Appt. End Time: 1545  12/19/2022  [name], identified by name and date of birth, is a 62 y.o. male with a diagnosis of Diabetes:  .   ASSESSMENT He did not want his A1c done today. He was not interested in medication change today.  His blood sugars are without change.  They are 5% in target and 95% high, no lows, average is 265 x 30 days, 273 x 14 days. His abmbulatory glucsoe Profile is mostly flat consistent with his lower carb diet. His lower blood sugars that reached target range around 4-5 PM were felt to be due to his 4-5 days of fasting last week. He denies symptoms of hyperglycemia except increased urination.  . I encouraged him to to talk to the doctor about increasing his long acting insulin vs meal time insulin as I do not think his current plan will lower his blood sugar enough to reach goals. He states the SGLT-2s lowered his blood sugars well, but he was taken off for DKA.   Lab Results  Component Value Date   HGBA1C 10.3 (A) 09/15/2022   HGBA1C 11.3 (A) 05/16/2022   HGBA1C 11.6 (A) 02/04/2022   HGBA1C 11.6 (A) 06/02/2020   HGBA1C 9.2 (A) 02/12/2020    Wt Readings from Last 10 Encounters:  12/19/22 172 lb (78 kg)  09/15/22 171 lb 1.6 oz (77.6 kg)  07/18/22 168 lb 3.2 oz (76.3 kg)  07/07/22 168 lb 12.8 oz (76.6 kg)  05/16/22 171 lb (77.6 kg)  03/21/22 175 lb 6.4 oz (79.6 kg)  03/07/22 173 lb 4.8 oz (78.6 kg)  02/04/22 164 lb 4.8 oz (74.5 kg)  06/02/20 179 lb 12.8 oz (81.6 kg)  04/06/20 178 lb 9.6 oz (81 kg)   Estimated body mass index is 27.76 kg/m as calculated from the following:   Height as of 09/15/22: 5\' 6"  (1.676 m).   Weight as of this encounter: 172 lb (78 kg). .    Diabetes Self-Management Education - 12/19/22 1600       Visit Information   Visit Type Follow-up   #2 after initial     Health Coping   How would you rate your overall health?  Good      Patient Education   Previous Diabetes Education Yes (please comment)    Being Active Role of exercise on diabetes management, blood pressure control and cardiac health.    Acute complications Discussed and identified patients' prevention, symptoms, and treatment of hyperglycemia.      Individualized Goals (developed by patient)   Monitoring  Consistenly use CGM      Patient Self-Evaluation of Goals - Patient rates self as meeting previously set goals (% of time)   Monitoring >75% (most of the time)      Subsequent Visit   Since your last visit have you continued or begun to take your medications as prescribed? Yes   stopped Rybelsus for 4 days to see what that would do, also fasted for 5 days MOnday through friday of last week from 6 AM to 6 PM   Since your last visit have you had your blood pressure checked? No    Since your last visit have you experienced any weight changes? No change    Since your last visit, are you checking your blood glucose at least once a day? Yes  Individualized Plan for Diabetes Self-Management Training:   Learning Objective:  Patient will have a greater understanding of diabetes self-management. Patient education plan is to attend individual and/or group sessions per assessed needs and concerns.   Plan:   Patient Instructions  Thank you for your visit today!  Your plan to lower you blood sugars is to drink more water. Cut down on the beer.  Please stop at front desk to make an appointment with a doctor.  Recommended followed once a year with me unless you have questions or concerns before that.   Lupita Leash [phone]   Expected Outcomes:  Demonstrated interest in learning. Expect positive outcomes  Education material provided: Diabetes Resources  If problems or questions, patient to contact team via:  Phone  Future DSME appointment: Baker Pierini Eulogia Dismore, RD 12/19/2022 4:16 PM.

## 2022-12-19 NOTE — Patient Instructions (Addendum)
Thank you for your visit today!  Your plan to lower you blood sugars is to drink more water. Cut down on the beer.  Please stop at front desk to make an appointment with a doctor.  Recommended followed once a year with me unless you have questions or concerns before that.   Lupita Leash [phone]

## 2022-12-26 ENCOUNTER — Other Ambulatory Visit: Payer: Self-pay

## 2022-12-26 ENCOUNTER — Other Ambulatory Visit (HOSPITAL_COMMUNITY): Payer: Self-pay

## 2022-12-28 ENCOUNTER — Other Ambulatory Visit (HOSPITAL_COMMUNITY): Payer: Self-pay

## 2023-01-11 ENCOUNTER — Other Ambulatory Visit (HOSPITAL_COMMUNITY): Payer: Self-pay

## 2023-01-11 ENCOUNTER — Ambulatory Visit: Payer: 59 | Admitting: Student

## 2023-01-11 ENCOUNTER — Encounter: Payer: Self-pay | Admitting: Student

## 2023-01-11 VITALS — BP 149/82 | HR 91 | Temp 97.8°F | Ht 66.0 in | Wt 175.0 lb

## 2023-01-11 DIAGNOSIS — Z794 Long term (current) use of insulin: Secondary | ICD-10-CM

## 2023-01-11 DIAGNOSIS — E785 Hyperlipidemia, unspecified: Secondary | ICD-10-CM

## 2023-01-11 DIAGNOSIS — E782 Mixed hyperlipidemia: Secondary | ICD-10-CM

## 2023-01-11 DIAGNOSIS — I10 Essential (primary) hypertension: Secondary | ICD-10-CM | POA: Diagnosis not present

## 2023-01-11 DIAGNOSIS — E119 Type 2 diabetes mellitus without complications: Secondary | ICD-10-CM | POA: Diagnosis not present

## 2023-01-11 LAB — POCT GLYCOSYLATED HEMOGLOBIN (HGB A1C): Hemoglobin A1C: 10.9 % — AB (ref 4.0–5.6)

## 2023-01-11 LAB — GLUCOSE, CAPILLARY: Glucose-Capillary: 239 mg/dL — ABNORMAL HIGH (ref 70–99)

## 2023-01-11 MED ORDER — INSULIN GLARGINE 100 UNIT/ML SOLOSTAR PEN: 25.0000 [IU] | PEN_INJECTOR | Freq: Every day | SUBCUTANEOUS | Status: DC

## 2023-01-11 MED ORDER — INSULIN GLARGINE 100 UNIT/ML SOLOSTAR PEN: 30.0000 [IU] | PEN_INJECTOR | Freq: Every day | SUBCUTANEOUS | Status: DC

## 2023-01-11 MED ORDER — PEN NEEDLES 32G X 4 MM MISC
3 refills | Status: DC
  Filled 2023-01-11 – 2023-04-26 (×2): qty 100, 30d supply, fill #0
  Filled 2023-07-10: qty 100, 30d supply, fill #1
  Filled 2023-10-04: qty 100, 30d supply, fill #2

## 2023-01-11 MED ORDER — INSULIN LISPRO (1 UNIT DIAL) 100 UNIT/ML (KWIKPEN)
8.0000 [IU] | PEN_INJECTOR | Freq: Three times a day (TID) | SUBCUTANEOUS | 3 refills | Status: DC
  Filled 2023-01-11: qty 6, 25d supply, fill #0
  Filled 2023-03-22: qty 6, 25d supply, fill #1
  Filled 2023-05-08: qty 6, 25d supply, fill #2
  Filled 2023-07-19: qty 6, 25d supply, fill #3

## 2023-01-11 NOTE — Assessment & Plan Note (Signed)
Patient presents to clinic today for blood pressure follow-up.  Patient's current regimen includes olmesartan 20 mg daily.  Current blood pressure today is 149/82.  This is elevated.  States took his medication today.  He denies any chest pain, shortness of breath, or vision changes.  He states adherence to the medication.  Plan: -Continue olmesartan 20 mg daily -Patient to follow-up in 1 month for blood pressure recheck -Patient encouraged to keep blood pressure log

## 2023-01-11 NOTE — Patient Instructions (Addendum)
[name], Gregory Sosa you for allowing me to take part in your care today.  Here are your instructions.  1. Please start short acting insulin with your long acting. Long acting in the morning 30 units. Short acting 8 units with meals. Please let us know if you have any lows.  2. I will follow up your urine test and all you with the results.   3. Please come back in about 1 month and we can talk about your regimen.   Thank you, Dr. Allena Katz  If you have any other questions please contact the internal medicine clinic at [phone]

## 2023-01-11 NOTE — Assessment & Plan Note (Signed)
Patient presents for diabetes follow-up.  Patient's most recent A1c on March 2024 was 10.3.  A1c today is elevated at 10.9.  Patient's current regimen includes Rybelsus 14 mg daily, Lantus 25 units nightly.  Patient diabetes at this time is uncontrolled. Did interpret patient CGM today, and patient has average blood glucose of 266.  Patient reports adherence to his medication.  At this time, given that patient is uncontrolled with elevated A1c at 10.9, will initiate mealtime insulin.  Will start Humalog 8 units with meals.  Will increase long-acting to 30 units nightly.  Had discussion with patient about the importance of strict glycemic control.  Will have patient follow-up in 1 month to evaluate CGM.  Plan: -Increase to 30 units of Lantus nightly -Start Humalog 8 units with meals -Continue Rybelsus 14 mg daily -Update microalbumin creatinine ratio today -Follow up in 1 month

## 2023-01-11 NOTE — Assessment & Plan Note (Signed)
Patient's most recent lipid panel on 02/04/2022 showing elevated LDL at 178, and elevated total cholesterol at 296.  Patient also had elevated triglycerides at 442.  Patient was on Crestor 20 mg daily.  He states for the past month, he stopped taking this given that he heard negative comments about statins and decided to take himself off.  I encouraged him to continue taking his statin given that he is at high risk for MI and stroke if cholesterol mains uncontrolled along with uncontrolled diabetes and hypertension.  Plan: -Continue Crestor 20 mg daily -Given patient has been nonadherent to Crestor, do not think it is beneficial to get lipid panel at this time, can obtain lipid panel at next visit

## 2023-01-11 NOTE — Progress Notes (Signed)
CC: Diabetes follow up   HPI:  [name] is a 62 y.o. male with a past medical history of hypertension, primary hyperparathyroidism, type 2 diabetes mellitus presenting to the clinic for HTN and Diabetes follow up. Please see assessment and plan for full HPI.   Medications: Type 2 diabetes mellitus: Rybelsus 14 mg daily, Lantus 30 units nightly, humalog 8 units TID with meals Hypertension: Olmesartan 20 mg daily ED: Viagra Hyperlipidemia: Crestor 20 mg daily- has not been taking this.   Past Medical History:  Diagnosis Date   Diabetes mellitus type 2, uncontrolled 03/23/2011   Dyslipidemia 03/23/2011   21.5% 10-year risk of heart disease or stroke.      Elevated PSA    Elevated PSA, between 10 and less than 20 ng/ml 08/02/2012   GERD (gastroesophageal reflux disease) 10/09/2012   H/O atypical chest pain 03/23/2011   S/p clean cath on 03/01/11-no evidence of significant CAD.  Refused cardiology referral when cardiology attempted to make appointment.     Hypercalcemia    Hypertension    Vitamin D deficiency      Current Outpatient Medications:    insulin lispro (HUMALOG) 100 UNIT/ML KwikPen, Inject 8 Units into the skin 3 (three) times daily with meals., Disp: 6 mL, Rfl: 3   Blood Glucose Monitoring Suppl (ONE TOUCH ULTRA SYSTEM KIT) W/DEVICE KIT, Use to check sugar once to twice a day. Dx code: 250.00., Disp: 1 each, Rfl: 0   Continuous Glucose Sensor (DEXCOM G7 SENSOR) MISC, Place new sensor every 10 days. Use to monitor blood sugar continuously., Disp: 3 each, Rfl: 11   glucose blood test strip, Use as instructed, Disp: 100 each, Rfl: 12   glucose blood (ONE TOUCH TEST STRIPS) test strip, Use to check sugar once to twice a day. Dx code: 250.00., Disp: 100 each, Rfl: 12   insulin glargine (LANTUS) 100 UNIT/ML Solostar Pen, Inject 30 Units into the skin at bedtime., Disp: , Rfl:    Insulin Pen Needle (PEN NEEDLES) 32G X 4 MM MISC, Use new needle with each injection, Disp:  100 each, Rfl: 3   Lancets (ONETOUCH ULTRASOFT) lancets, Use to check sugar once to twice a day. Dx code: 250.00., Disp: 100 each, Rfl: 12   olmesartan (BENICAR) 20 MG tablet, Take 1 tablet (20 mg total) by mouth daily., Disp: 30 tablet, Rfl: 11   rosuvastatin (CRESTOR) 20 MG tablet, Take 1 tablet (20 mg total) by mouth daily., Disp: 90 tablet, Rfl: 2   Semaglutide (RYBELSUS) 14 MG TABS, Take 1 tablet (14 mg total) by mouth daily., Disp: 90 tablet, Rfl: 1   sildenafil (VIAGRA) 50 MG tablet, Take 1 tablet (50 mg total) by mouth as needed for erectile dysfunction., Disp: 20 tablet, Rfl: 1  Review of Systems:   Negative except what is stated in HPI   Physical Exam:  Vitals:   01/11/23 1002  BP: (!) 149/82  Pulse: 91  Temp: 97.8 F (36.6 C)  TempSrc: Oral  SpO2: 97%  Weight: 175 lb (79.4 kg)  Height: 5\' 6"  (1.676 m)   General: Patient is sitting comfortably in the room  Head: Normocephalic, atraumatic  Cardio: Regular rate and rhythm, no murmurs, rubs or gallops Pulmonary: Clear to ausculation bilaterally with no rales, rhonchi, and crackles    Assessment & Plan:   Hypertension Patient presents to clinic today for blood pressure follow-up.  Patient's current regimen includes olmesartan 20 mg daily.  Current blood pressure today is 149/82.  This is elevated.  States took his medication today.  He denies any chest pain, shortness of breath, or vision changes.  He states adherence to the medication.  Plan: -Continue olmesartan 20 mg daily -Patient to follow-up in 1 month for blood pressure recheck -Patient encouraged to keep blood pressure log  Diabetes [facility]) Patient presents for diabetes follow-up.  Patient's most recent A1c on March 2024 was 10.3.  A1c today is elevated at 10.9.  Patient's current regimen includes Rybelsus 14 mg daily, Lantus 25 units nightly.  Patient diabetes at this time is uncontrolled. Did interpret patient CGM today, and patient has average blood glucose of  266.  Patient reports adherence to his medication.  At this time, given that patient is uncontrolled with elevated A1c at 10.9, will initiate mealtime insulin.  Will start Humalog 8 units with meals.  Will increase long-acting to 30 units nightly.  Had discussion with patient about the importance of strict glycemic control.  Will have patient follow-up in 1 month to evaluate CGM.  Plan: -Increase to 30 units of Lantus nightly -Start Humalog 8 units with meals -Continue Rybelsus 14 mg daily -Update microalbumin creatinine ratio today -Follow up in 1 month  Hyperlipidemia Patient's most recent lipid panel on 02/04/2022 showing elevated LDL at 178, and elevated total cholesterol at 296.  Patient also had elevated triglycerides at 442.  Patient was on Crestor 20 mg daily.  He states for the past month, he stopped taking this given that he heard negative comments about statins and decided to take himself off.  I encouraged him to continue taking his statin given that he is at high risk for MI and stroke if cholesterol mains uncontrolled along with uncontrolled diabetes and hypertension.  Plan: -Continue Crestor 20 mg daily -Given patient has been nonadherent to Crestor, do not think it is beneficial to get lipid panel at this time, can obtain lipid panel at next visit  Patient discussed with Dr. Letta Kocher, DO PGY-2 Internal Medicine Resident  Pager: [phone]

## 2023-01-12 LAB — MICROALBUMIN / CREATININE URINE RATIO
Creatinine, Urine: 59.7 mg/dL
Microalb/Creat Ratio: 8 mg/g{creat} (ref 0–29)
Microalbumin, Urine: 4.9 ug/mL

## 2023-01-12 NOTE — Progress Notes (Signed)
 Internal Medicine Clinic Attending  Case discussed with the resident physician at the time of the visit.  We reviewed the patient's history, exam, and pertinent patient test results.  I agree with the assessment, diagnosis, and plan of care documented in the resident's note.

## 2023-01-16 ENCOUNTER — Other Ambulatory Visit (HOSPITAL_COMMUNITY): Payer: Self-pay

## 2023-01-22 ENCOUNTER — Other Ambulatory Visit: Payer: Self-pay | Admitting: Student

## 2023-01-22 DIAGNOSIS — E119 Type 2 diabetes mellitus without complications: Secondary | ICD-10-CM

## 2023-01-23 ENCOUNTER — Other Ambulatory Visit (HOSPITAL_COMMUNITY): Payer: Self-pay

## 2023-01-23 ENCOUNTER — Other Ambulatory Visit: Payer: Self-pay | Admitting: Student

## 2023-01-24 ENCOUNTER — Other Ambulatory Visit (HOSPITAL_COMMUNITY): Payer: Self-pay

## 2023-01-24 MED ORDER — INSULIN GLARGINE 100 UNIT/ML SOLOSTAR PEN
30.0000 [IU] | PEN_INJECTOR | Freq: Every day | SUBCUTANEOUS | 1 refills | Status: DC
  Filled 2023-01-24: qty 9, 30d supply, fill #0
  Filled 2023-03-01: qty 9, 30d supply, fill #1
  Filled 2023-03-22 – 2023-03-24 (×2): qty 9, 30d supply, fill #2
  Filled 2023-04-24: qty 3, 10d supply, fill #3

## 2023-01-24 NOTE — Telephone Encounter (Signed)
Last rx written 9/4 was sent as "No Print".

## 2023-01-25 ENCOUNTER — Other Ambulatory Visit (HOSPITAL_COMMUNITY): Payer: Self-pay

## 2023-02-02 ENCOUNTER — Other Ambulatory Visit (HOSPITAL_COMMUNITY): Payer: Self-pay

## 2023-03-01 ENCOUNTER — Other Ambulatory Visit (HOSPITAL_COMMUNITY): Payer: Self-pay

## 2023-03-15 ENCOUNTER — Other Ambulatory Visit (HOSPITAL_COMMUNITY): Payer: Self-pay

## 2023-03-22 ENCOUNTER — Other Ambulatory Visit (HOSPITAL_COMMUNITY): Payer: Self-pay

## 2023-03-22 ENCOUNTER — Other Ambulatory Visit: Payer: Self-pay

## 2023-04-24 ENCOUNTER — Other Ambulatory Visit: Payer: Self-pay

## 2023-04-24 ENCOUNTER — Other Ambulatory Visit (HOSPITAL_COMMUNITY): Payer: Self-pay

## 2023-04-26 ENCOUNTER — Other Ambulatory Visit: Payer: Self-pay

## 2023-04-26 ENCOUNTER — Other Ambulatory Visit (HOSPITAL_COMMUNITY): Payer: Self-pay

## 2023-04-26 ENCOUNTER — Other Ambulatory Visit: Payer: Self-pay | Admitting: Student

## 2023-04-26 DIAGNOSIS — N529 Male erectile dysfunction, unspecified: Secondary | ICD-10-CM

## 2023-04-27 ENCOUNTER — Other Ambulatory Visit (HOSPITAL_COMMUNITY): Payer: Self-pay

## 2023-04-27 ENCOUNTER — Other Ambulatory Visit: Payer: Self-pay | Admitting: Student

## 2023-04-27 ENCOUNTER — Telehealth: Payer: Self-pay

## 2023-04-27 DIAGNOSIS — E119 Type 2 diabetes mellitus without complications: Secondary | ICD-10-CM

## 2023-04-27 NOTE — Telephone Encounter (Signed)
Pharmacy Patient Advocate Encounter   Received notification from CoverMyMeds that prior authorization for RYBELSUS 14MG  is required/requested.    The patient is insured through  [facility] .   Per test claim: PA required; PA submitted to above mentioned insurance via CoverMyMeds Key/confirmation #/[id number] [id number]. Status is pending

## 2023-04-28 ENCOUNTER — Other Ambulatory Visit (HOSPITAL_COMMUNITY): Payer: Self-pay

## 2023-04-28 MED ORDER — LANTUS SOLOSTAR 100 UNIT/ML ~~LOC~~ SOPN
30.0000 [IU] | PEN_INJECTOR | Freq: Every day | SUBCUTANEOUS | 1 refills | Status: DC
  Filled 2023-04-28: qty 15, 50d supply, fill #0
  Filled 2023-05-08: qty 9, 30d supply, fill #0

## 2023-04-28 NOTE — Telephone Encounter (Signed)
Pharmacy Patient Advocate Encounter  Received notification from [facility] that Prior Authorization for [facility] 14MG  has been APPROVED from 04/27/23 to 04/26/24

## 2023-05-01 ENCOUNTER — Encounter (HOSPITAL_COMMUNITY): Payer: Self-pay

## 2023-05-01 ENCOUNTER — Other Ambulatory Visit: Payer: Self-pay | Admitting: Student

## 2023-05-01 ENCOUNTER — Encounter: Payer: Self-pay | Admitting: Student

## 2023-05-01 ENCOUNTER — Other Ambulatory Visit (HOSPITAL_COMMUNITY): Payer: Self-pay

## 2023-05-01 DIAGNOSIS — N529 Male erectile dysfunction, unspecified: Secondary | ICD-10-CM

## 2023-05-01 MED FILL — Sildenafil Citrate Tab 50 MG: ORAL | 20 days supply | Qty: 20 | Fill #0 | Status: AC

## 2023-05-08 ENCOUNTER — Other Ambulatory Visit: Payer: Self-pay

## 2023-05-08 ENCOUNTER — Other Ambulatory Visit (HOSPITAL_COMMUNITY): Payer: Self-pay

## 2023-05-11 ENCOUNTER — Other Ambulatory Visit: Payer: Self-pay | Admitting: Student

## 2023-05-11 ENCOUNTER — Other Ambulatory Visit (HOSPITAL_COMMUNITY): Payer: Self-pay

## 2023-05-11 DIAGNOSIS — I10 Essential (primary) hypertension: Secondary | ICD-10-CM

## 2023-05-12 ENCOUNTER — Other Ambulatory Visit (HOSPITAL_COMMUNITY): Payer: Self-pay

## 2023-05-12 MED ORDER — OLMESARTAN MEDOXOMIL 20 MG PO TABS
20.0000 mg | ORAL_TABLET | Freq: Every day | ORAL | 11 refills | Status: DC
  Filled 2023-05-12: qty 30, 30d supply, fill #0

## 2023-05-15 ENCOUNTER — Other Ambulatory Visit (HOSPITAL_COMMUNITY): Payer: Self-pay

## 2023-05-30 ENCOUNTER — Encounter: Payer: 59 | Admitting: Student

## 2023-06-08 ENCOUNTER — Other Ambulatory Visit (HOSPITAL_COMMUNITY): Payer: Self-pay

## 2023-06-08 ENCOUNTER — Ambulatory Visit: Payer: 59 | Admitting: Student

## 2023-06-08 VITALS — BP 157/79 | HR 67 | Ht 66.0 in | Wt 182.2 lb

## 2023-06-08 DIAGNOSIS — E782 Mixed hyperlipidemia: Secondary | ICD-10-CM

## 2023-06-08 DIAGNOSIS — Z794 Long term (current) use of insulin: Secondary | ICD-10-CM

## 2023-06-08 DIAGNOSIS — Z1211 Encounter for screening for malignant neoplasm of colon: Secondary | ICD-10-CM

## 2023-06-08 DIAGNOSIS — E785 Hyperlipidemia, unspecified: Secondary | ICD-10-CM | POA: Diagnosis not present

## 2023-06-08 DIAGNOSIS — I10 Essential (primary) hypertension: Secondary | ICD-10-CM | POA: Diagnosis not present

## 2023-06-08 DIAGNOSIS — E119 Type 2 diabetes mellitus without complications: Secondary | ICD-10-CM

## 2023-06-08 LAB — POCT GLYCOSYLATED HEMOGLOBIN (HGB A1C): Hemoglobin A1C: 11.4 % — AB (ref 4.0–5.6)

## 2023-06-08 LAB — GLUCOSE, CAPILLARY: Glucose-Capillary: 387 mg/dL — ABNORMAL HIGH (ref 70–99)

## 2023-06-08 MED ORDER — OLMESARTAN MEDOXOMIL 40 MG PO TABS
40.0000 mg | ORAL_TABLET | Freq: Every day | ORAL | 11 refills | Status: DC
  Filled 2023-06-08: qty 30, 30d supply, fill #0
  Filled 2023-07-10: qty 30, 30d supply, fill #1

## 2023-06-08 MED ORDER — LANTUS SOLOSTAR 100 UNIT/ML ~~LOC~~ SOPN
PEN_INJECTOR | SUBCUTANEOUS | 1 refills | Status: DC
  Filled 2023-06-08: qty 9, 25d supply, fill #0
  Filled 2023-07-10: qty 9, 25d supply, fill #1
  Filled 2023-08-04: qty 9, 25d supply, fill #2
  Filled 2023-09-06: qty 9, 25d supply, fill #3
  Filled 2023-09-08: qty 3, 8d supply, fill #3

## 2023-06-08 NOTE — Assessment & Plan Note (Addendum)
Diabetes continues to be uncontrolled, continuous glucose monitor results reviewed, patient persistently hyperglycemic to 250+.  A1c today 11.4.  Patient currently on insulin glargine 30 units daily, Humalog 8 units daily (prescribed 3 times daily).  His largest meal is dinner, and takes his Humalog and glargine both at nighttime.  Plan: Increase insulin glargine to 36 units daily, patient instructed to watch sugars over the next 3 to 5 days.  If fasting sugars in the morning remain above goal of 180, patient to add 2 units of glargine at night.  Patient will then watch sugars again for next 3 to 5 days, and repeat until he reaches goal of 180-200 Patient in agreement with this plan Diabetic foot exam today.

## 2023-06-08 NOTE — Progress Notes (Signed)
Subjective:  CC: Diabetes follow-up  HPI:  [name] is a 63 y.o. person with a past medical history stated below and presents today for the stated chief complaint. Please see problem based assessment and plan for additional details.  Past Medical History:  Diagnosis Date   Diabetes mellitus type 2, uncontrolled 03/23/2011   Dyslipidemia 03/23/2011   21.5% 10-year risk of heart disease or stroke.      Elevated PSA    Elevated PSA, between 10 and less than 20 ng/ml 08/02/2012   GERD (gastroesophageal reflux disease) 10/09/2012   H/O atypical chest pain 03/23/2011   S/p clean cath on 03/01/11-no evidence of significant CAD.  Refused cardiology referral when cardiology attempted to make appointment.     Hypercalcemia    Hypertension    Vitamin D deficiency     Current Outpatient Medications on File Prior to Visit  Medication Sig Dispense Refill   Blood Glucose Monitoring Suppl (ONE TOUCH ULTRA SYSTEM KIT) W/DEVICE KIT Use to check sugar once to twice a day. Dx code: 250.00. 1 each 0   Continuous Glucose Sensor (DEXCOM G7 SENSOR) MISC Place new sensor every 10 days. Use to monitor blood sugar continuously. 3 each 11   glucose blood test strip Use as instructed 100 each 12   glucose blood (ONE TOUCH TEST STRIPS) test strip Use to check sugar once to twice a day. Dx code: 250.00. 100 each 12   insulin lispro (HUMALOG) 100 UNIT/ML KwikPen Inject 8 Units into the skin 3 (three) times daily with meals. 6 mL 3   Insulin Pen Needle (PEN NEEDLES) 32G X 4 MM MISC Use new needle with each injection 100 each 3   Lancets (ONETOUCH ULTRASOFT) lancets Use to check sugar once to twice a day. Dx code: 250.00. 100 each 12   rosuvastatin (CRESTOR) 20 MG tablet Take 1 tablet (20 mg total) by mouth daily. 90 tablet 2   Semaglutide (RYBELSUS) 14 MG TABS Take 1 tablet (14 mg total) by mouth daily. 90 tablet 1   sildenafil (VIAGRA) 50 MG tablet Take 1 tablet (50 mg total) by mouth as needed for  erectile dysfunction. 20 tablet 1   No current facility-administered medications on file prior to visit.    Review of Systems: Please see assessment and plan for pertinent positives and negatives.  Objective:   Vitals:   06/08/23 1340 06/08/23 1411  BP: (!) 163/77 (!) 157/79  Pulse: 80 67  TempSrc: Oral   SpO2: 99%   Weight: 182 lb 3.2 oz (82.6 kg)   Height: 5\' 6"  (1.676 m)     Physical Exam: Constitutional: Well-appearing Cardiovascular: Regular rate and rhythm Pulmonary/Chest: lungs clear to auscultation bilaterally Abdominal: soft, non-tender, non-distended Extremities: No edema of the lower extremities bilaterally.  Dorsalis pedis and posterior tibial pulses intact bilaterally. Psych: Pleasant affect Thought process is linear and is goal-directed.     Assessment & Plan:  Colon cancer screening Patient not interested in colonoscopy at this time, discussed importance and encouraged screening.  May benefit from Cologuard.  Hyperlipidemia Discussed importance of management of modifiable risk factors and preventing vascular event.  Discussed importance of managing hyperlipidemia in diabetic patients.  Patient not interested in initiation of statin at this time.  Diabetes (HCC) Diabetes continues to be uncontrolled, continuous glucose monitor results reviewed, patient persistently hyperglycemic to 250+.  A1c today 11.4.  Patient currently on insulin glargine 30 units daily, Humalog 8 units daily (prescribed 3 times daily).  His largest  meal is dinner, and takes his Humalog and glargine both at nighttime.  Plan: Increase insulin glargine to 36 units daily, patient instructed to watch sugars over the next 3 to 5 days.  If fasting sugars in the morning remain above goal of 180, patient to add 2 units of glargine at night.  Patient will then watch sugars again for next 3 to 5 days, and repeat until he reaches goal of 180-200 Patient in agreement with this plan Diabetic foot exam  today.   Hypertension On olmesartan 20 mg, patient saying he is taking this medication consistently.  Blood pressure elevated at 157/79 today, will dose increase olmesartan to 40 mg daily, reassess blood pressures in 1 week.  If patient is persistently hypertensive, will consider switching patient to olmesartan-amlodipine combination pill.  Patient prefer to minimize pill burden.    Patient discussed with Dr. Julian Reil MD [facility]  PGY-1 Pager: [phone]  Phone: [phone] Date 06/08/2023  Time 9:27 PM

## 2023-06-08 NOTE — Patient Instructions (Signed)
Thank you, Gregory Sosa for allowing [place] to provide your care today.  I have ordered the following tests for you:  Lab Orders         Glucose, capillary         POC Hbg A1C       I have ordered the following medication/changed the following medications:    Start the following medications: Meds ordered this encounter  Medications   olmesartan (BENICAR) 40 MG tablet    Sig: Take 1 tablet (40 mg total) by mouth daily.    Dispense:  30 tablet    Refill:  11   insulin glargine (LANTUS SOLOSTAR) 100 UNIT/ML Solostar Pen    Sig: Inject 36 units daily at bedtime. Wait 3-5 days. If your fasting blood sugar (morning when you wake up) is above 180. Increase by 2 units, and wait 3-5 days again. Repeat until fasting blood sugar reaches 180 -200 range.    Dispense:  15 mL    Refill:  1      Follow up:  1 month  for diabetes and blood pressure   We look forward to seeing you next time. Please call our clinic at [phone] if you have any questions or concerns. The best time to call is Monday-Friday from 9am-4pm, but there is someone available 24/7. If after hours or the weekend, call the main hospital number and ask for the Internal Medicine Resident On-Call. If you need medication refills, please notify your pharmacy one week in advance and they will send [place] a request.   Thank you for trusting me with your care. Wishing you the best!  Lovie Macadamia MD [facility]

## 2023-06-08 NOTE — Assessment & Plan Note (Addendum)
On olmesartan 20 mg, patient saying he is taking this medication consistently.  Blood pressure elevated at 157/79 today, will dose increase olmesartan to 40 mg daily, reassess blood pressures in 1 week.  If patient is persistently hypertensive, will consider switching patient to olmesartan-amlodipine combination pill.  Patient prefer to minimize pill burden.

## 2023-06-08 NOTE — Assessment & Plan Note (Addendum)
Patient not interested in colonoscopy at this time, discussed importance and encouraged screening.  May benefit from Cologuard.

## 2023-06-08 NOTE — Assessment & Plan Note (Signed)
Discussed importance of management of modifiable risk factors and preventing vascular event.  Discussed importance of managing hyperlipidemia in diabetic patients.  Patient not interested in initiation of statin at this time.

## 2023-06-12 NOTE — Progress Notes (Signed)
 Internal Medicine Clinic Attending  Case discussed with the resident at the time of the visit.  We reviewed the resident's history and exam and pertinent patient test results.  I agree with the assessment, diagnosis, and plan of care documented in the resident's note.

## 2023-06-12 NOTE — Addendum Note (Signed)
Addended by: Dickie La on: 06/12/2023 01:05 PM   Modules accepted: Level of Service

## 2023-06-28 MED FILL — Sildenafil Citrate Tab 50 MG: ORAL | 20 days supply | Qty: 20 | Fill #1 | Status: AC

## 2023-06-29 ENCOUNTER — Other Ambulatory Visit (HOSPITAL_COMMUNITY): Payer: Self-pay

## 2023-07-10 ENCOUNTER — Other Ambulatory Visit (HOSPITAL_COMMUNITY): Payer: Self-pay

## 2023-07-10 ENCOUNTER — Other Ambulatory Visit: Payer: Self-pay

## 2023-07-12 ENCOUNTER — Encounter: Payer: 59 | Admitting: Student

## 2023-07-18 ENCOUNTER — Ambulatory Visit: Admitting: Student

## 2023-07-18 ENCOUNTER — Other Ambulatory Visit (HOSPITAL_COMMUNITY): Payer: Self-pay

## 2023-07-18 VITALS — BP 146/82 | HR 76 | Temp 97.7°F | Ht 66.0 in | Wt 186.0 lb

## 2023-07-18 DIAGNOSIS — E785 Hyperlipidemia, unspecified: Secondary | ICD-10-CM | POA: Diagnosis not present

## 2023-07-18 DIAGNOSIS — Z1211 Encounter for screening for malignant neoplasm of colon: Secondary | ICD-10-CM

## 2023-07-18 DIAGNOSIS — Z794 Long term (current) use of insulin: Secondary | ICD-10-CM

## 2023-07-18 DIAGNOSIS — E782 Mixed hyperlipidemia: Secondary | ICD-10-CM

## 2023-07-18 DIAGNOSIS — E119 Type 2 diabetes mellitus without complications: Secondary | ICD-10-CM

## 2023-07-18 DIAGNOSIS — I10 Essential (primary) hypertension: Secondary | ICD-10-CM

## 2023-07-18 DIAGNOSIS — E1165 Type 2 diabetes mellitus with hyperglycemia: Secondary | ICD-10-CM | POA: Diagnosis not present

## 2023-07-18 MED ORDER — AMLODIPINE BESYLATE 5 MG PO TABS
5.0000 mg | ORAL_TABLET | Freq: Every day | ORAL | 11 refills | Status: DC
  Filled 2023-07-18: qty 30, 30d supply, fill #0
  Filled 2023-08-17: qty 30, 30d supply, fill #1
  Filled 2023-09-18: qty 30, 30d supply, fill #2

## 2023-07-18 MED ORDER — AMLODIPINE-OLMESARTAN 5-40 MG PO TABS
1.0000 | ORAL_TABLET | Freq: Every day | ORAL | 3 refills | Status: DC
  Filled 2023-07-18: qty 30, 30d supply, fill #0

## 2023-07-18 MED ORDER — OLMESARTAN MEDOXOMIL 40 MG PO TABS
40.0000 mg | ORAL_TABLET | Freq: Every day | ORAL | 11 refills | Status: DC
  Filled 2023-07-18 – 2023-08-17 (×2): qty 30, 30d supply, fill #0
  Filled 2023-09-18: qty 30, 30d supply, fill #1

## 2023-07-18 NOTE — Progress Notes (Signed)
 Subjective:  CC: Follow-up on diabetes and blood pressure  HPI:  [name] is a 63 y.o. person with a past medical history stated below and presents today for the stated chief complaint. Please see problem based assessment and plan for additional details.  Past Medical History:  Diagnosis Date   Diabetes mellitus type 2, uncontrolled 03/23/2011   Dyslipidemia 03/23/2011   21.5% 10-year risk of heart disease or stroke.      Elevated PSA    Elevated PSA, between 10 and less than 20 ng/ml 08/02/2012   GERD (gastroesophageal reflux disease) 10/09/2012   H/O atypical chest pain 03/23/2011   S/p clean cath on 03/01/11-no evidence of significant CAD.  Refused cardiology referral when cardiology attempted to make appointment.     Hypercalcemia    Hypertension    Vitamin D deficiency     Current Outpatient Medications on File Prior to Visit  Medication Sig Dispense Refill   Blood Glucose Monitoring Suppl (ONE TOUCH ULTRA SYSTEM KIT) W/DEVICE KIT Use to check sugar once to twice a day. Dx code: 250.00. 1 each 0   Continuous Glucose Sensor (DEXCOM G7 SENSOR) MISC Place new sensor every 10 days. Use to monitor blood sugar continuously. 3 each 11   glucose blood test strip Use as instructed 100 each 12   glucose blood (ONE TOUCH TEST STRIPS) test strip Use to check sugar once to twice a day. Dx code: 250.00. 100 each 12   insulin glargine (LANTUS SOLOSTAR) 100 UNIT/ML Solostar Pen Inject 36 units daily at bedtime. Wait 3-5 days. If your fasting blood sugar (morning when you wake up) is above 180. Increase by 2 units, and wait 3-5 days again. Repeat until fasting blood sugar reaches 180 -200 range. 15 mL 1   insulin lispro (HUMALOG) 100 UNIT/ML KwikPen Inject 8 Units into the skin 3 (three) times daily with meals. 6 mL 3   Insulin Pen Needle (PEN NEEDLES) 32G X 4 MM MISC Use new needle with each injection 100 each 3   Lancets (ONETOUCH ULTRASOFT) lancets Use to check sugar once to twice a  day. Dx code: 250.00. 100 each 12   rosuvastatin (CRESTOR) 20 MG tablet Take 1 tablet (20 mg total) by mouth daily. 90 tablet 2   Semaglutide (RYBELSUS) 14 MG TABS Take 1 tablet (14 mg total) by mouth daily. 90 tablet 1   sildenafil (VIAGRA) 50 MG tablet Take 1 tablet (50 mg total) by mouth as needed for erectile dysfunction. 20 tablet 1   No current facility-administered medications on file prior to visit.    Review of Systems: Please see assessment and plan for pertinent positives and negatives.  Objective:   Vitals:   07/18/23 1459 07/18/23 1528  BP: (!) 154/95 (!) 146/82  Pulse: 77 76  Temp: 97.7 F (36.5 C)   TempSrc: Oral   SpO2: 98%   Weight: 186 lb (84.4 kg)   Height: 5\' 6"  (1.676 m)     Physical Exam: Constitutional: Well-appearing Cardiovascular: Regular rate and rhythm Pulmonary/Chest: lungs clear to auscultation bilaterally Abdominal: soft, non-tender, non-distended Extremities: No edema of the lower extremities bilaterally Psych: Pleasant affect Thought process is linear and is goal-directed.     Assessment & Plan:  Colon cancer screening Patient previously not interested in colonoscopy.  He is 74 and has not had colon cancer screening.  I discussed Cologuard with the patient today, he would be interested in this option. Plan: Cologaurd  Diabetes (HCC) Poorly controlled type 2  diabetes.  At last visit, patient was taking insulin glargine 30 units daily, and Humalog 8 units with dinner (prescribed 3 times daily with meals).  We increased patient's insulin to 36 units glargine daily with instructions on how to adjust insulin dosing to reach goal of 180-200 fasting.  He is currently taking 36 units units of glargine daily.  He is not interested in increasing his insulin dosages.  He has been out of CGM sensors, but plans to fill this again soon.  In reviewing his glucometer, blood sugars persistently in the 200-300 range.  Patient states that his sugars have come  down in the mornings, and have been in the mid to low 200s fasting. Plan: Discussed increasing insulin dosages, patient prefers to attempt lifestyle modifications Lifestyle modifications discussed Follow-up in 2 months for dedicated diabetes visit, A1c   Hyperlipidemia ASCVD score discussed with this patient, he previously had not been interested in taking statin.  After discussion, patient states that he will try and resume taking his Crestor.  The 10-year ASCVD risk score (Arnett DK, et al., 2019) is: 58.6%   Values used to calculate the score:     Age: 70 years     Sex: Male     Is Non-Hispanic African American: Yes     Diabetic: Yes     Tobacco smoker: Yes     Systolic Blood Pressure: 146 mmHg     Is BP treated: Yes     HDL Cholesterol: 29 mg/dL     Total Cholesterol: 296 mg/dL   Hypertension Olmesartan increased to 40 mg last visit, patient since he has been taking this consistently.  Blood pressure today 146/82, slightly improved from prior.  We will initiate amlodipine today.  I attempted to transition patient to olmesartan amlodipine combination pill to reduce pill burden, however received message from pharmacy that this would not be covered. Plan: Continue olmesartan 40 Add amlodipine 5 mg daily Follow-up 2 months for A1c blood pressure check CMP today   Hypercalcemia Persistent hypercalcemia in the past, this has been worked up with parathyroid hormone level and 24-hour urine calcium.  Patient had inappropriately elevated parathyroid hormone.  24-hour urine calcium level would qualify him for parathyroidectomy.  Patient is not interested in surgical procedure at this time.  Denies any abdominal pain, history of kidney stones, flank pain.  Asymptomatic at this time. Plan: CMP Vitamin D level    Patient discussed with Dr. Julian Reil MD [facility] Internal Medicine  PGY-1 Pager: [phone]  Phone: [phone] Date 07/18/2023  Time 4:52 PM

## 2023-07-18 NOTE — Assessment & Plan Note (Signed)
 Persistent hypercalcemia in the past, this has been worked up with parathyroid hormone level and 24-hour urine calcium.  Patient had inappropriately elevated parathyroid hormone.  24-hour urine calcium level would qualify him for parathyroidectomy.  Patient is not interested in surgical procedure at this time.  Denies any abdominal pain, history of kidney stones, flank pain.  Asymptomatic at this time. Plan: CMP Vitamin D level

## 2023-07-18 NOTE — Assessment & Plan Note (Signed)
 Poorly controlled type 2 diabetes.  At last visit, patient was taking insulin glargine 30 units daily, and Humalog 8 units with dinner (prescribed 3 times daily with meals).  We increased patient's insulin to 36 units glargine daily with instructions on how to adjust insulin dosing to reach goal of 180-200 fasting.  He is currently taking 36 units units of glargine daily.  He is not interested in increasing his insulin dosages.  He has been out of CGM sensors, but plans to fill this again soon.  In reviewing his glucometer, blood sugars persistently in the 200-300 range.  Patient states that his sugars have come down in the mornings, and have been in the mid to low 200s fasting. Plan: Discussed increasing insulin dosages, patient prefers to attempt lifestyle modifications Lifestyle modifications discussed Follow-up in 2 months for dedicated diabetes visit, A1c

## 2023-07-18 NOTE — Assessment & Plan Note (Signed)
 ASCVD score discussed with this patient, he previously had not been interested in taking statin.  After discussion, patient states that he will try and resume taking his Crestor.  The 10-year ASCVD risk score (Arnett DK, et al., 2019) is: 58.6%   Values used to calculate the score:     Age: 63 years     Sex: Male     Is Non-Hispanic African American: Yes     Diabetic: Yes     Tobacco smoker: Yes     Systolic Blood Pressure: 146 mmHg     Is BP treated: Yes     HDL Cholesterol: 29 mg/dL     Total Cholesterol: 296 mg/dL

## 2023-07-18 NOTE — Assessment & Plan Note (Signed)
 Olmesartan increased to 40 mg last visit, patient since he has been taking this consistently.  Blood pressure today 146/82, slightly improved from prior.  We will initiate amlodipine today.  I attempted to transition patient to olmesartan amlodipine combination pill to reduce pill burden, however received message from pharmacy that this would not be covered. Plan: Continue olmesartan 40 Add amlodipine 5 mg daily Follow-up 2 months for A1c blood pressure check CMP today

## 2023-07-18 NOTE — Assessment & Plan Note (Signed)
 Patient previously not interested in colonoscopy.  He is 9 and has not had colon cancer screening.  I discussed Cologuard with the patient today, he would be interested in this option. Plan: Cologaurd

## 2023-07-18 NOTE — Patient Instructions (Addendum)
 Thank you, [name] for allowing [place] to provide your care today.  I have ordered the following tests for you:  Lab Orders         Cologuard         CMP14 + Anion Gap         Vitamin D (25 hydroxy)       I have ordered the following medication/changed the following medications:   Stop the following medications: Medications Discontinued During This Encounter  Medication Reason   olmesartan (BENICAR) 40 MG tablet      Start the following medications: Meds ordered this encounter  Medications   amLODipine-olmesartan (AZOR) 5-40 MG tablet    Sig: Take 1 tablet by mouth daily.    Dispense:  90 tablet    Refill:  3      Follow up: 2 months for A1c, Blood pressure check    We look forward to seeing you next time. Please call our clinic at [phone] if you have any questions or concerns. The best time to call is Monday-Friday from 9am-4pm, but there is someone available 24/7. If after hours or the weekend, call the main hospital number and ask for the Internal Medicine Resident On-Call. If you need medication refills, please notify your pharmacy one week in advance and they will send [place] a request.   Thank you for trusting me with your care. Wishing you the best!  Lovie Macadamia MD [facility]

## 2023-07-19 ENCOUNTER — Other Ambulatory Visit: Payer: Self-pay

## 2023-07-19 ENCOUNTER — Encounter: Payer: Self-pay | Admitting: Student

## 2023-07-19 ENCOUNTER — Other Ambulatory Visit (HOSPITAL_COMMUNITY): Payer: Self-pay

## 2023-07-19 LAB — CMP14 + ANION GAP
ALT: 23 IU/L (ref 0–44)
AST: 15 IU/L (ref 0–40)
Albumin: 4.4 g/dL (ref 3.9–4.9)
Alkaline Phosphatase: 62 IU/L (ref 44–121)
Anion Gap: 16 mmol/L (ref 10.0–18.0)
BUN/Creatinine Ratio: 14 (ref 10–24)
BUN: 9 mg/dL (ref 8–27)
Bilirubin Total: 0.4 mg/dL (ref 0.0–1.2)
CO2: 19 mmol/L — ABNORMAL LOW (ref 20–29)
Calcium: 9.9 mg/dL (ref 8.6–10.2)
Chloride: 100 mmol/L (ref 96–106)
Creatinine, Ser: 0.65 mg/dL — ABNORMAL LOW (ref 0.76–1.27)
Globulin, Total: 2.5 g/dL (ref 1.5–4.5)
Glucose: 147 mg/dL — ABNORMAL HIGH (ref 70–99)
Potassium: 3.8 mmol/L (ref 3.5–5.2)
Sodium: 135 mmol/L (ref 134–144)
Total Protein: 6.9 g/dL (ref 6.0–8.5)
eGFR: 107 mL/min/{1.73_m2} (ref >59)

## 2023-07-19 LAB — VITAMIN D 25 HYDROXY (VIT D DEFICIENCY, FRACTURES): Vit D, 25-Hydroxy: 19.3 ng/mL — ABNORMAL LOW (ref 30.0–100.0)

## 2023-07-26 NOTE — Addendum Note (Signed)
 Addended by: Dickie La on: 07/26/2023 01:49 PM   Modules accepted: Level of Service

## 2023-07-26 NOTE — Progress Notes (Signed)
 Internal Medicine Clinic Attending  Case discussed with the resident at the time of the visit.  We reviewed the resident's history and exam and pertinent patient test results.  I agree with the assessment, diagnosis, and plan of care documented in the resident's note.  I suspect Gregory Sosa will need further medication for treatment of his diabetes given continued hyperglycemia, however he has declined today. Continue to discuss at future visit.

## 2023-07-29 LAB — COLOGUARD: COLOGUARD: POSITIVE — AB

## 2023-07-31 ENCOUNTER — Telehealth: Payer: Self-pay | Admitting: Student

## 2023-08-01 ENCOUNTER — Other Ambulatory Visit: Payer: Self-pay | Admitting: Student

## 2023-08-01 DIAGNOSIS — R195 Other fecal abnormalities: Secondary | ICD-10-CM

## 2023-08-01 NOTE — Progress Notes (Signed)
 I spoke with Gregory Sosa on the phone. Patient's identity was confirmed using two patient specific identifiers. We discussed his Cologuard results - His Cologuard was positive. Discussed that this means his stool contained abnormalities which are high risk and may indicate presence of colon cancer. Informed patient that he should follow with GI for colonoscopy to evaluate for colon cancer. Patient stated he will think about this, but he is open to GI referral placement to discuss further.

## 2023-08-02 NOTE — Telephone Encounter (Signed)
 I spoke with Gregory Sosa on the phone. Patient's identity was confirmed using two patient specific identifiers. We discussed his Cologuard results - His Cologuard was positive. Discussed that this means his stool contained abnormalities which are high risk and may indicate presence of colon cancer. Informed patient that he should follow with GI for colonoscopy to evaluate for colon cancer. Patient stated he will think about this, but he is open to GI referral placement to discuss further.

## 2023-08-04 ENCOUNTER — Other Ambulatory Visit (HOSPITAL_COMMUNITY): Payer: Self-pay

## 2023-08-04 ENCOUNTER — Other Ambulatory Visit: Payer: Self-pay

## 2023-08-04 ENCOUNTER — Other Ambulatory Visit: Payer: Self-pay | Admitting: Student

## 2023-08-04 DIAGNOSIS — Z794 Long term (current) use of insulin: Secondary | ICD-10-CM

## 2023-08-04 MED ORDER — INSULIN LISPRO (1 UNIT DIAL) 100 UNIT/ML (KWIKPEN)
8.0000 [IU] | PEN_INJECTOR | Freq: Three times a day (TID) | SUBCUTANEOUS | 3 refills | Status: DC
  Filled 2023-08-04 – 2023-08-07 (×2): qty 6, 25d supply, fill #0

## 2023-08-04 NOTE — Telephone Encounter (Signed)
 Medication sent to pharmacy

## 2023-08-07 ENCOUNTER — Other Ambulatory Visit (HOSPITAL_COMMUNITY): Payer: Self-pay

## 2023-08-17 ENCOUNTER — Other Ambulatory Visit (HOSPITAL_COMMUNITY): Payer: Self-pay

## 2023-08-17 ENCOUNTER — Other Ambulatory Visit: Payer: Self-pay

## 2023-09-07 ENCOUNTER — Other Ambulatory Visit (HOSPITAL_COMMUNITY): Payer: Self-pay

## 2023-09-08 ENCOUNTER — Other Ambulatory Visit (HOSPITAL_COMMUNITY): Payer: Self-pay

## 2023-09-08 ENCOUNTER — Other Ambulatory Visit: Payer: Self-pay | Admitting: Student

## 2023-09-08 DIAGNOSIS — E119 Type 2 diabetes mellitus without complications: Secondary | ICD-10-CM

## 2023-09-11 ENCOUNTER — Other Ambulatory Visit (HOSPITAL_COMMUNITY): Payer: Self-pay

## 2023-09-11 MED ORDER — LANTUS SOLOSTAR 100 UNIT/ML ~~LOC~~ SOPN
PEN_INJECTOR | SUBCUTANEOUS | 1 refills | Status: DC
  Filled 2023-09-11: qty 9, 25d supply, fill #0

## 2023-09-11 NOTE — Telephone Encounter (Signed)
 Medication sent to pharmacy

## 2023-09-20 ENCOUNTER — Encounter: Admitting: Student

## 2023-09-22 ENCOUNTER — Other Ambulatory Visit (HOSPITAL_COMMUNITY): Payer: Self-pay

## 2023-09-25 ENCOUNTER — Encounter: Payer: Self-pay | Admitting: Student

## 2023-09-25 ENCOUNTER — Other Ambulatory Visit: Payer: Self-pay | Admitting: Student

## 2023-09-25 ENCOUNTER — Ambulatory Visit: Admitting: Student

## 2023-09-25 ENCOUNTER — Other Ambulatory Visit (HOSPITAL_COMMUNITY): Payer: Self-pay

## 2023-09-25 VITALS — BP 131/78 | HR 76 | Temp 97.6°F | Ht 66.0 in | Wt 184.9 lb

## 2023-09-25 DIAGNOSIS — Z1211 Encounter for screening for malignant neoplasm of colon: Secondary | ICD-10-CM

## 2023-09-25 DIAGNOSIS — I10 Essential (primary) hypertension: Secondary | ICD-10-CM | POA: Diagnosis not present

## 2023-09-25 DIAGNOSIS — E785 Hyperlipidemia, unspecified: Secondary | ICD-10-CM

## 2023-09-25 DIAGNOSIS — E559 Vitamin D deficiency, unspecified: Secondary | ICD-10-CM

## 2023-09-25 DIAGNOSIS — E1100 Type 2 diabetes mellitus with hyperosmolarity without nonketotic hyperglycemic-hyperosmolar coma (NKHHC): Secondary | ICD-10-CM

## 2023-09-25 DIAGNOSIS — E119 Type 2 diabetes mellitus without complications: Secondary | ICD-10-CM

## 2023-09-25 DIAGNOSIS — E0800 Diabetes mellitus due to underlying condition with hyperosmolarity without nonketotic hyperglycemic-hyperosmolar coma (NKHHC): Secondary | ICD-10-CM | POA: Diagnosis not present

## 2023-09-25 DIAGNOSIS — Z794 Long term (current) use of insulin: Secondary | ICD-10-CM | POA: Diagnosis not present

## 2023-09-25 DIAGNOSIS — E7800 Pure hypercholesterolemia, unspecified: Secondary | ICD-10-CM

## 2023-09-25 LAB — POCT GLYCOSYLATED HEMOGLOBIN (HGB A1C): Hemoglobin A1C: 11.3 % — AB (ref 4.0–5.6)

## 2023-09-25 LAB — GLUCOSE, CAPILLARY: Glucose-Capillary: 229 mg/dL — ABNORMAL HIGH (ref 70–99)

## 2023-09-25 MED ORDER — OLMESARTAN-AMLODIPINE-HCTZ 40-5-12.5 MG PO TABS
1.0000 | ORAL_TABLET | Freq: Every day | ORAL | 2 refills | Status: DC
Start: 2023-09-25 — End: 2023-09-25
  Filled 2023-09-25: qty 30, 30d supply, fill #0

## 2023-09-25 MED ORDER — AMLODIPINE BESYLATE 5 MG PO TABS
5.0000 mg | ORAL_TABLET | Freq: Every day | ORAL | 11 refills | Status: DC
  Filled 2023-09-25: qty 30, 30d supply, fill #0

## 2023-09-25 MED ORDER — ROSUVASTATIN CALCIUM 20 MG PO TABS
20.0000 mg | ORAL_TABLET | Freq: Every day | ORAL | 2 refills | Status: AC
  Filled 2023-09-25: qty 30, 30d supply, fill #0

## 2023-09-25 MED ORDER — HYDROCHLOROTHIAZIDE 12.5 MG PO CAPS
12.5000 mg | ORAL_CAPSULE | Freq: Every day | ORAL | 11 refills | Status: DC
  Filled 2023-09-25: qty 30, 30d supply, fill #0
  Filled 2023-10-24: qty 30, 30d supply, fill #1

## 2023-09-25 MED ORDER — LANTUS SOLOSTAR 100 UNIT/ML ~~LOC~~ SOPN
42.0000 [IU] | PEN_INJECTOR | Freq: Every day | SUBCUTANEOUS | 1 refills | Status: DC
  Filled 2023-09-25: qty 15, 35d supply, fill #0
  Filled 2023-10-04: qty 12, 28d supply, fill #0

## 2023-09-25 MED ORDER — RYBELSUS 14 MG PO TABS
14.0000 mg | ORAL_TABLET | Freq: Every day | ORAL | 1 refills | Status: DC
  Filled 2023-09-25: qty 30, 30d supply, fill #0
  Filled 2023-10-24: qty 30, 30d supply, fill #1

## 2023-09-25 MED ORDER — OLMESARTAN MEDOXOMIL 40 MG PO TABS
40.0000 mg | ORAL_TABLET | Freq: Every day | ORAL | 11 refills | Status: DC
  Filled 2023-09-25: qty 30, 30d supply, fill #0

## 2023-09-25 NOTE — Progress Notes (Signed)
 CC: Chronic condition follow-up  HPI:  Gregory Sosa is a 63 y.o. male with a past medical history of hypertension, type 2 diabetes mellitus, hyperlipidemia who presents for 64-month follow-up appointment.  Please see assessment and plan for full HPI.  Medications: Hypertension: Olmesartan -amlodipine -HCTZ 40-5-12 0.5 mg daily Diabetes mellitus: Lantus  42 units daily, semaglutide  14 mg daily Hyperlipidemia: Crestor  20 mg daily Erectile dysfunction: Viagra  50 mg as needed  Past Medical History:  Diagnosis Date   Diabetes mellitus type 2, uncontrolled 03/23/2011   Dyslipidemia 03/23/2011   21.5% 10-year risk of heart disease or stroke.      Elevated PSA    Elevated PSA, between 10 and less than 20 ng/ml 08/02/2012   GERD (gastroesophageal reflux disease) 10/09/2012   H/O atypical chest pain 03/23/2011   S/p clean cath on 03/01/11-no evidence of significant CAD.  Refused cardiology referral when cardiology attempted to make appointment.     Hypercalcemia    Hypertension    Vitamin D  deficiency      Current Outpatient Medications:    Olmesartan -amLODIPine -HCTZ 40-5-12.5 MG TABS, Take 1 tablet by mouth daily., Disp: 90 tablet, Rfl: 2   Blood Glucose Monitoring Suppl (ONE TOUCH ULTRA SYSTEM KIT) W/DEVICE KIT, Use to check sugar once to twice a day. Dx code: 250.00., Disp: 1 each, Rfl: 0   Continuous Glucose Sensor (DEXCOM G7 SENSOR) MISC, Place new sensor every 10 days. Use to monitor blood sugar continuously., Disp: 3 each, Rfl: 11   glucose blood test strip, Use as instructed, Disp: 100 each, Rfl: 12   glucose blood (ONE TOUCH TEST STRIPS) test strip, Use to check sugar once to twice a day. Dx code: 250.00., Disp: 100 each, Rfl: 12   insulin  glargine (LANTUS  SOLOSTAR) 100 UNIT/ML Solostar Pen, Inject 42 Units into the skin at bedtime. Wait 3-5 days. If your fasting blood sugar (morning when you wake up) is above 180, increase by 2 units, and wait 3-5 days again. Repeat until fasting  blood sugar reaches 180 -200 range., Disp: 15 mL, Rfl: 1   Insulin  Pen Needle (PEN NEEDLES) 32G X 4 MM MISC, Use new needle with each injection, Disp: 100 each, Rfl: 3   Lancets (ONETOUCH ULTRASOFT) lancets, Use to check sugar once to twice a day. Dx code: 250.00., Disp: 100 each, Rfl: 12   rosuvastatin  (CRESTOR ) 20 MG tablet, Take 1 tablet (20 mg total) by mouth daily., Disp: 90 tablet, Rfl: 2   Semaglutide  (RYBELSUS ) 14 MG TABS, Take 1 tablet (14 mg total) by mouth daily., Disp: 90 tablet, Rfl: 1   sildenafil  (VIAGRA ) 50 MG tablet, Take 1 tablet (50 mg total) by mouth as needed for erectile dysfunction., Disp: 20 tablet, Rfl: 1  Review of Systems:    Negative except for what is stated in HPI  Physical Exam:  Vitals:   09/25/23 1335  BP: 131/78  Pulse: 76  Temp: 97.6 F (36.4 C)  TempSrc: Oral  SpO2: 100%  Weight: 184 lb 14.4 oz (83.9 kg)  Height: 5\' 6"  (1.676 m)   General: Patient is sitting comfortably in the room  Head: Normocephalic, atraumatic  Cardio: Regular rate and rhythm, no murmurs, rubs or gallops Pulmonary: Clear to ausculation bilaterally with no rales, rhonchi, and crackles   Assessment & Plan:   Diabetes [facility]) Patient has a past medical history of type 2 diabetes mellitus.  His current regimen includes Lantus  36 units daily.  He also uses Humalog  8 units 3 times daily.  He was stopped  on Rybelsus  given he was started on mealtime insulin  and to avoid hypoglycemia it was stopped.  His A1c today is 11.3.  He endorses that he has not been taking his mealtime insulin  as prescribed as he forgets to take it.  He only remembers to take his Lantus  because he takes it 1 time a day.  He states his sugars have been very elevated.  Looking at his CGM data, he is high 75% of the time.  He has 0% lows.  Given that he is not taking his mealtime insulin , will discontinue this.  Will start back Rybelsus  14 mg daily.  Will increase Lantus  to 42 units daily.  His Lantus  he takes with  dinner which is his largest meal of the day.  Plan: - Increase to Lantus  42 units daily - Discontinue Humalog  - Start semaglutide  14 mg daily - Patient to return in 2 weeks for insulin  titration - Patient encouraged to be adherent to his medications  Hypertension Patient has a past medical history of hypertension.  Patient recently had his olmesartan  increased to 40 mg daily.  He was also started on amlodipine .  He was to follow-up for blood pressure check today.  Blood pressure today is 131/78.  This is close to goal.  He states he checks his blood pressure at home and it is 150s systolics usually.  This is not at goal.  Will add HCTZ.  Plan: - Stop single agents of amlodipine  and olmesartan  - Start combination medication olmesartan -amlodipine -HCTZ 40-5-12.5 mg daily - Patient to return in 2 weeks and check BMP at that time  Colon cancer screening Patient was previously screened for colon cancer.  He had a Cologuard which was positive.  Will discuss colonoscopy today with patient.  Plan: - Refer patient for colonoscopy  Hyperlipidemia Patient's last lipid panel was on who September 2023 and at that time LDL was 178, total cholesterol was 296, and triglycerides 442.  Current medication includes Crestor  20 mg daily but he states he does not take this.  He states he is worried about side effects.  I counseled the patient on the importance of taking his statin therapy.  He states he will start taking it again.   Plan: - Continue Crestor  20 mg daily - Repeat lipid panel  Vitamin D  deficiency Patient has a past medical history of vitamin D  deficiency.  Previous check showed vitamin D  of 19.3.  Patient was recommended over-the-counter supplementation but he did not start taking it.  I recommended him to start taking a vitamin D  supplement  Plan: - Recheck vitamin D  levels at next visit  Patient discussed with Dr. Amiel Balder, DO PGY-2 Internal Medicine Resident

## 2023-09-25 NOTE — Assessment & Plan Note (Addendum)
 Patient has a past medical history of hypertension.  Patient recently had his olmesartan  increased to 40 mg daily.  He was also started on amlodipine .  He was to follow-up for blood pressure check today.  Blood pressure today is 131/78.  This is close to goal.  He states he checks his blood pressure at home and it is 150s systolics usually.  This is not at goal.  Will add HCTZ.  Plan: - Stop single agents of amlodipine  and olmesartan  - Start combination medication olmesartan -amlodipine -HCTZ 40-5-12.5 mg daily - Patient to return in 2 weeks and check BMP at that time

## 2023-09-25 NOTE — Assessment & Plan Note (Addendum)
 Patient has a past medical history of type 2 diabetes mellitus.  His current regimen includes Lantus  36 units daily.  He also uses Humalog  8 units 3 times daily.  He was stopped on Rybelsus  given he was started on mealtime insulin  and to avoid hypoglycemia it was stopped.  His A1c today is 11.3.  He endorses that he has not been taking his mealtime insulin  as prescribed as he forgets to take it.  He only remembers to take his Lantus  because he takes it 1 time a day.  He states his sugars have been very elevated.  Looking at his CGM data, he is high 75% of the time.  He has 0% lows.  Given that he is not taking his mealtime insulin , will discontinue this.  Will start back Rybelsus  14 mg daily.  Will increase Lantus  to 42 units daily.  His Lantus  he takes with dinner which is his largest meal of the day.  Plan: - Increase to Lantus  42 units daily - Discontinue Humalog  - Start semaglutide  14 mg daily - Patient to return in 2 weeks for insulin  titration - Patient encouraged to be adherent to his medications

## 2023-09-25 NOTE — Assessment & Plan Note (Addendum)
 Patient was previously screened for colon cancer.  He had a Cologuard which was positive.  Will discuss colonoscopy today with patient.  Plan: - Refer patient for colonoscopy

## 2023-09-25 NOTE — Assessment & Plan Note (Addendum)
 Patient has a past medical history of vitamin D  deficiency.  Previous check showed vitamin D  of 19.3.  Patient was recommended over-the-counter supplementation but he did not start taking it.  I recommended him to start taking a vitamin D  supplement  Plan: - Recheck vitamin D  levels at next visit

## 2023-09-25 NOTE — Assessment & Plan Note (Addendum)
 Patient's last lipid panel was on who September 2023 and at that time LDL was 178, total cholesterol was 296, and triglycerides 442.  Current medication includes Crestor  20 mg daily but he states he does not take this.  He states he is worried about side effects.  I counseled the patient on the importance of taking his statin therapy.  He states he will start taking it again.   Plan: - Continue Crestor  20 mg daily - Repeat lipid panel

## 2023-09-25 NOTE — Patient Instructions (Addendum)
 Gregory Sosa, Gregory Sosa you for allowing me to take part in your care today.  Here are your instructions.  1.  Please stop taking your mealtime insulin .  Increase your Lantus  to 42 units daily.  You can restart Rybelsus  daily.  Come back in 2 weeks and at that time we can look at your monitor and see how your sugars are doing.  Your A1c today was 11.3.  2.  Please start taking Crestor .  3.  I have referred you for a colonoscopy.  I am checking your cholesterol numbers today.  4.  Stop taking amlodipine  and olmesartan .  I have sent you a combination pill called olmesartan -amlodipine -HCTZ.  Please take 1 daily.  5.  I have taken some labs, I will call you with the results.  Thank you, Dr. Lydia Sams  If you have any other questions please contact the internal medicine clinic at [phone] If it is after hours, please call the [facility] at [phone] and then ask the person who picks up for the resident on call.

## 2023-09-26 ENCOUNTER — Other Ambulatory Visit (HOSPITAL_COMMUNITY): Payer: Self-pay

## 2023-09-26 ENCOUNTER — Ambulatory Visit: Payer: Self-pay | Admitting: Student

## 2023-09-26 LAB — LIPID PANEL
Chol/HDL Ratio: 5.3 ratio — ABNORMAL HIGH (ref 0.0–5.0)
Cholesterol, Total: 239 mg/dL — ABNORMAL HIGH (ref 100–199)
HDL: 45 mg/dL (ref >39)
LDL Chol Calc (NIH): 182 mg/dL — ABNORMAL HIGH (ref 0–99)
Triglycerides: 72 mg/dL (ref 0–149)
VLDL Cholesterol Cal: 12 mg/dL (ref 5–40)

## 2023-09-26 NOTE — Progress Notes (Signed)
 Internal Medicine Clinic Attending  Case discussed with the resident at the time of the visit.  We reviewed the resident's history and exam and pertinent patient test results.  I agree with the assessment, diagnosis, and plan of care documented in the resident's note.

## 2023-10-04 ENCOUNTER — Other Ambulatory Visit (HOSPITAL_COMMUNITY): Payer: Self-pay

## 2023-10-04 ENCOUNTER — Other Ambulatory Visit: Payer: Self-pay

## 2023-10-04 ENCOUNTER — Other Ambulatory Visit: Payer: Self-pay | Admitting: Student

## 2023-10-04 DIAGNOSIS — N529 Male erectile dysfunction, unspecified: Secondary | ICD-10-CM

## 2023-10-04 MED FILL — Sildenafil Citrate Tab 50 MG: ORAL | 20 days supply | Qty: 20 | Fill #0 | Status: AC

## 2023-10-04 NOTE — Telephone Encounter (Signed)
 Medication sent to pharmacy

## 2023-10-11 ENCOUNTER — Ambulatory Visit: Admitting: Student

## 2023-10-11 ENCOUNTER — Other Ambulatory Visit: Payer: Self-pay

## 2023-10-11 ENCOUNTER — Other Ambulatory Visit (HOSPITAL_COMMUNITY): Payer: Self-pay

## 2023-10-11 ENCOUNTER — Encounter: Payer: Self-pay | Admitting: Student

## 2023-10-11 VITALS — BP 121/68 | HR 72 | Temp 97.5°F | Ht 66.0 in | Wt 179.0 lb

## 2023-10-11 DIAGNOSIS — Z794 Long term (current) use of insulin: Secondary | ICD-10-CM

## 2023-10-11 DIAGNOSIS — E119 Type 2 diabetes mellitus without complications: Secondary | ICD-10-CM

## 2023-10-11 DIAGNOSIS — I10 Essential (primary) hypertension: Secondary | ICD-10-CM

## 2023-10-11 MED ORDER — LANTUS SOLOSTAR 100 UNIT/ML ~~LOC~~ SOPN
48.0000 [IU] | PEN_INJECTOR | Freq: Every day | SUBCUTANEOUS | 2 refills | Status: DC
  Filled 2023-10-11 – 2023-10-25 (×2): qty 15, 31d supply, fill #0
  Filled 2023-12-11: qty 15, 31d supply, fill #1

## 2023-10-11 MED ORDER — INSULIN LISPRO (1 UNIT DIAL) 100 UNIT/ML (KWIKPEN)
8.0000 [IU] | PEN_INJECTOR | Freq: Three times a day (TID) | SUBCUTANEOUS | 4 refills | Status: DC
  Filled 2023-10-11: qty 15, 63d supply, fill #0

## 2023-10-11 MED ORDER — PEN NEEDLES 32G X 4 MM MISC
3 refills | Status: AC
  Filled 2023-10-11: qty 100, fill #0
  Filled 2024-01-08: qty 100, 25d supply, fill #0
  Filled 2024-03-29: qty 100, 25d supply, fill #1
  Filled 2024-06-05: qty 100, 25d supply, fill #2

## 2023-10-11 NOTE — Progress Notes (Signed)
 CC: 2 week T2DM f/u  HPI:  [name] is a 63 y.o. male living with a history stated below and presents today for T2DM f/u. Please see problem based assessment and plan for additional details.  Past Medical History:  Diagnosis Date   Diabetes mellitus type 2, uncontrolled 03/23/2011   Dyslipidemia 03/23/2011   21.5% 10-year risk of heart disease or stroke.      Elevated PSA    Elevated PSA, between 10 and less than 20 ng/ml 08/02/2012   GERD (gastroesophageal reflux disease) 10/09/2012   H/O atypical chest pain 03/23/2011   S/p clean cath on 03/01/11-no evidence of significant CAD.  Refused cardiology referral when cardiology attempted to make appointment.     Hypercalcemia    Hypertension    Vitamin D  deficiency     Current Outpatient Medications on File Prior to Visit  Medication Sig Dispense Refill   sildenafil  (VIAGRA ) 50 MG tablet Take 1 tablet (50 mg total) by mouth as needed for erectile dysfunction. 20 tablet 1   Blood Glucose Monitoring Suppl (ONE TOUCH ULTRA SYSTEM KIT) W/DEVICE KIT Use to check sugar once to twice a day. Dx code: 250.00. 1 each 0   Continuous Glucose Sensor (DEXCOM G7 SENSOR) MISC Place new sensor every 10 days. Use to monitor blood sugar continuously. 3 each 11   glucose blood test strip Use as instructed 100 each 12   glucose blood (ONE TOUCH TEST STRIPS) test strip Use to check sugar once to twice a day. Dx code: 250.00. 100 each 12   hydrochlorothiazide  (MICROZIDE ) 12.5 MG capsule Take 1 capsule (12.5 mg total) by mouth daily. 30 capsule 11   Lancets (ONETOUCH ULTRASOFT) lancets Use to check sugar once to twice a day. Dx code: 250.00. 100 each 12   rosuvastatin  (CRESTOR ) 20 MG tablet Take 1 tablet (20 mg total) by mouth daily. 90 tablet 2   Semaglutide  (RYBELSUS ) 14 MG TABS Take 1 tablet (14 mg total) by mouth daily. 90 tablet 1   No current facility-administered medications on file prior to visit.    Family History  Problem Relation Age of  Onset   Heart disease Father     Social History   Socioeconomic History   Marital status: Married    Spouse name: Not on file   Number of children: Not on file   Years of education: 16   Highest education level: Not on file  Occupational History   Not on file  Tobacco Use   Smoking status: Every Day    Current packs/day: 0.00    Types: E-cigarettes, Cigarettes    Last attempt to quit: 02/06/2013    Years since quitting: 10.6   Smokeless tobacco: Never   Tobacco comments:    Vapes     Patient vape daily 07/18/2022  Vaping Use   Vaping status: Never Used  Substance and Sexual Activity   Alcohol use: Yes    Comment: Wine   Drug use: Never   Sexual activity: Yes  Other Topics Concern   Not on file  Social History Narrative   Not on file   Social Drivers of Health   Financial Resource Strain: Not on file  Food Insecurity: No Food Insecurity (07/18/2023)   Hunger Vital Sign    Worried About Running Out of Food in the Last Year: Never true    Ran Out of Food in the Last Year: Never true  Transportation Needs: No Transportation Needs (07/18/2023)   PRAPARE - Transportation  Lack of Transportation (Medical): No    Lack of Transportation (Non-Medical): No  Physical Activity: Not on file  Stress: Not on file  Social Connections: Moderately Integrated (07/07/2022)   Social Connection and Isolation Panel [NHANES]    Frequency of Communication with Friends and Family: More than three times a week    Frequency of Social Gatherings with Friends and Family: Once a week    Attends Religious Services: More than 4 times per year    Active Member of [employer] or Organizations: No    Attends Banker Meetings: Never    Marital Status: Married  Catering manager Violence: Not At Risk (07/18/2023)   Humiliation, Afraid, Rape, and Kick questionnaire    Fear of Current or Ex-Partner: No    Emotionally Abused: No    Physically Abused: No    Sexually Abused: No    Review of  Systems: ROS negative except for what is noted on the assessment and plan.  Vitals:   10/11/23 1336 10/11/23 1402  BP: 137/71 121/68  Pulse: 75 72  Temp: (!) 97.5 F (36.4 C)   TempSrc: Oral   SpO2: 98%   Weight: 179 lb (81.2 kg)   Height: 5\' 6"  (1.676 m)    Physical Exam: Constitutional: well-appearing male sitting in chair comfortably, in no acute distress Cardiovascular: regular rate and rhythm Pulmonary/Chest: normal work of breathing on room air Neurological: alert & oriented x 3 Psych: pleasant mood  Assessment & Plan:   Hypertension BP well controlled today 121/68 on repeat. Reports only taking hydrochlorothiazide  12.5 mg daily. Was on amlodipine  and olmesartan  with plans at last OV 5/19 to consolidate into one pill with hydrochlorothiazide  but appears unable to do that. Confirmed x 2 with patient he is NOT taking amlodipine  or olmesartan . Given BP is controlled, will just continue hydrochlorothiazide  for now. Last BMP in 07/2023 with normal electrolytes and renal function.   Plan -Continue hydrochlorothiazide  -D/c amlodipine  and olmesartan  (add back ARB if indicated for renal protection or hypertension) -BMP today   Type 2 diabetes mellitus (HCC) Status: uncontrolled. Last A1c of 11.3 in 09/2023. Currently taking Lantus  42 units daily, Rybelsus  14 mg daily.  Patient denies hypoglycemia.  Per review of Dexcom, average blood glucose is 242.  Average fasting blood glucose is 200.   After discussion, patient expressed he would like to try meal time insulin  again despite stopping it at last OV. Discussed injectable GLP-1 but given increasing Lantus  and adding back Humalog , he would like to defer to next visit in 2 weeks since he just got back on Rybelsus  2 weeks ago.   Plan -F/u in 2 weeks  -Continue Rybelsus  14 mg (f/u on switch to GLP-1 injection) -INCREASE Lantus  to 48 units daily  -Restart Humalog  8 units TID AC -Continue Dexcom CGM, has glucometer in case -A1c in 2  months -Ophthalmology referral: done in 2024 -Urine ACR done in 01/2023 (normal) -Continue Crestor  20 mg (LDL not at goal)    Patient discussed with Dr. Ofilia Benton, D.O. [facility] Internal Medicine, PGY-2 Phone: [phone] Date 10/11/2023 Time 6:14 PM

## 2023-10-11 NOTE — Assessment & Plan Note (Addendum)
 BP well controlled today 121/68 on repeat. Reports only taking hydrochlorothiazide  12.5 mg daily. Was on amlodipine  and olmesartan  with plans at last OV 5/19 to consolidate into one pill with hydrochlorothiazide  but appears unable to do that. Confirmed x 2 with patient he is NOT taking amlodipine  or olmesartan . Given BP is controlled, will just continue hydrochlorothiazide  for now. Last BMP in 07/2023 with normal electrolytes and renal function.   Plan -Continue hydrochlorothiazide  -D/c amlodipine  and olmesartan  (add back ARB if indicated for renal protection or hypertension) -BMP today

## 2023-10-11 NOTE — Assessment & Plan Note (Addendum)
 Status: uncontrolled. Last A1c of 11.3 in 09/2023. Currently taking Lantus  42 units daily, Rybelsus  14 mg daily.  Patient denies hypoglycemia.  Per review of Dexcom, average blood glucose is 242.  Average fasting blood glucose is 200.   After discussion, patient expressed he would like to try meal time insulin  again despite stopping it at last OV. Discussed injectable GLP-1 but given increasing Lantus  and adding back Humalog , he would like to defer to next visit in 2 weeks since he just got back on Rybelsus  2 weeks ago.   Plan -F/u in 2 weeks  -Continue Rybelsus  14 mg (f/u on switch to GLP-1 injection) -INCREASE Lantus  to 48 units daily  -Restart Humalog  8 units TID AC -Continue Dexcom CGM, has glucometer in case -A1c in 2 months -Ophthalmology referral: done in 2024 -Urine ACR done in 01/2023 (normal) -Continue Crestor  20 mg (LDL not at goal)

## 2023-10-11 NOTE — Patient Instructions (Addendum)
 Thank you, GregoryJacorian Sosa for allowing us  to provide your care today. Today we discussed   -Blood pressure looks good 121/68. Continue hydrochlorothiazide . Do NOT take amlodipine  or olmesartan .   Diabetes: -Continue Rybelsus  but think about switching to once a week injection such as Ozempic  or Mounjaro  -Increase your Lantus  48 units at night  -Add back Humalog  8 units before meals  -Continue monitoring with Dexcom, call the office if low blood sugar < 70 -See you back in 2 weeks    -Blood work today    Follow up: 2 weeks    Should you have any questions or concerns please call the internal medicine clinic at [phone].    Chris Narasimhan, D.O. [facility]

## 2023-10-12 ENCOUNTER — Ambulatory Visit: Payer: Self-pay | Admitting: Student

## 2023-10-12 LAB — BMP8+ANION GAP
Anion Gap: 17 mmol/L (ref 10.0–18.0)
BUN/Creatinine Ratio: 15 (ref 10–24)
BUN: 13 mg/dL (ref 8–27)
CO2: 18 mmol/L — ABNORMAL LOW (ref 20–29)
Calcium: 10.9 mg/dL — ABNORMAL HIGH (ref 8.6–10.2)
Chloride: 99 mmol/L (ref 96–106)
Creatinine, Ser: 0.85 mg/dL (ref 0.76–1.27)
Glucose: 169 mg/dL — ABNORMAL HIGH (ref 70–99)
Potassium: 4 mmol/L (ref 3.5–5.2)
Sodium: 134 mmol/L (ref 134–144)
eGFR: 98 mL/min/{1.73_m2} (ref >59)

## 2023-10-16 NOTE — Progress Notes (Signed)
 Internal Medicine Clinic Attending  Case discussed with the resident at the time of the visit.  We reviewed the resident's history and exam and pertinent patient test results.  I agree with the assessment, diagnosis, and plan of care documented in the resident's note.

## 2023-10-24 ENCOUNTER — Other Ambulatory Visit (HOSPITAL_COMMUNITY): Payer: Self-pay

## 2023-10-25 ENCOUNTER — Other Ambulatory Visit (HOSPITAL_COMMUNITY): Payer: Self-pay

## 2023-10-31 ENCOUNTER — Telehealth: Payer: Self-pay

## 2023-10-31 ENCOUNTER — Ambulatory Visit: Payer: Self-pay | Admitting: Student

## 2023-10-31 ENCOUNTER — Other Ambulatory Visit (HOSPITAL_COMMUNITY): Payer: Self-pay

## 2023-10-31 VITALS — BP 147/84 | HR 74 | Temp 98.0°F | Wt 180.2 lb

## 2023-10-31 DIAGNOSIS — Z1211 Encounter for screening for malignant neoplasm of colon: Secondary | ICD-10-CM

## 2023-10-31 DIAGNOSIS — Z7985 Long-term (current) use of injectable non-insulin antidiabetic drugs: Secondary | ICD-10-CM | POA: Diagnosis not present

## 2023-10-31 DIAGNOSIS — I10 Essential (primary) hypertension: Secondary | ICD-10-CM

## 2023-10-31 DIAGNOSIS — E119 Type 2 diabetes mellitus without complications: Secondary | ICD-10-CM

## 2023-10-31 MED ORDER — OZEMPIC (2 MG/DOSE) 8 MG/3ML ~~LOC~~ SOPN
2.0000 mg | PEN_INJECTOR | SUBCUTANEOUS | 3 refills | Status: AC
  Filled 2023-10-31: qty 3, fill #0
  Filled 2024-02-15: qty 3, 28d supply, fill #0
  Filled 2024-04-08 (×2): qty 3, 28d supply, fill #1
  Filled 2024-05-30: qty 3, 28d supply, fill #2

## 2023-10-31 MED ORDER — OLMESARTAN MEDOXOMIL-HCTZ 20-12.5 MG PO TABS
1.0000 | ORAL_TABLET | Freq: Every day | ORAL | 3 refills | Status: AC
  Filled 2023-10-31 – 2023-11-13 (×2): qty 30, 30d supply, fill #0
  Filled 2023-12-11: qty 30, 30d supply, fill #1
  Filled 2024-01-16: qty 30, 30d supply, fill #2
  Filled 2024-03-08: qty 30, 30d supply, fill #3
  Filled 2024-04-08: qty 30, 30d supply, fill #4
  Filled 2024-05-15: qty 30, 30d supply, fill #5

## 2023-10-31 MED ORDER — OZEMPIC (0.25 OR 0.5 MG/DOSE) 2 MG/3ML ~~LOC~~ SOPN
PEN_INJECTOR | SUBCUTANEOUS | 1 refills | Status: AC
  Filled 2023-10-31: qty 3, 30d supply, fill #0
  Filled 2023-11-02: qty 3, 28d supply, fill #0
  Filled 2024-01-10: qty 3, 28d supply, fill #1

## 2023-10-31 MED ORDER — SEMAGLUTIDE (1 MG/DOSE) 4 MG/3ML ~~LOC~~ SOPN
1.0000 mg | PEN_INJECTOR | SUBCUTANEOUS | 0 refills | Status: DC
Start: 2023-12-26 — End: 2024-03-27
  Filled 2023-10-31 – 2024-01-10 (×2): qty 3, 28d supply, fill #0

## 2023-10-31 NOTE — Telephone Encounter (Signed)
 Gregory Sosa (Gregory Sosa) Rx #: [id number] Ozempic  (0.25 or 0.5 MG/DOSE) 2MG JONELLE pen-injectors Form [employer] Electronic Prior Authorization Form (2017 NCPDP) Created Sent to Plan Plan Response Submit Clinical Questions Determination Favorable Message from Plan Request Reference Number: [id number]. OZEMPIC  INJ 2MG /3ML is approved through 10/30/2024. Your patient may now fill this prescription and it will be covered.. Authorization Expiration Date: October 30, 2024.

## 2023-10-31 NOTE — Telephone Encounter (Signed)
 Prior Authorization for patient (Ozempic  (0.25 or 0.5 MG/DOSE) 2MG /3ML pen-injectors) came through on cover my meds was submitted with last office notes and labs awaiting approval or denial.   KEY:[id number]

## 2023-10-31 NOTE — Patient Instructions (Signed)
 Return in about 3 months (around 01/31/2024) for hypertension and diabetes.   Remember to bring all of the medications that you take (including over the counter medications and supplements) with you to every clinic visit.  This after visit summary is an important review of tests, referrals, and medication changes that were discussed during your visit. If you have questions or concerns, call [phone]. Outside of clinic business hours, call the main hospital at [phone] and ask the operator for the on-call internal medicine resident.   Ozell Kung MD 10/31/2023, 11:29 AM

## 2023-10-31 NOTE — Progress Notes (Unsigned)
 Patient name: Gregory Sosa Date of birth: May 02, 1961 Date of visit: 10/31/23  Subjective   Chief concern: no concerns, I'm fine  Doing well with diabetes medications. Sometimes misses mealtime insulin . Wore CGM this month for 14 days, showed average blood glucose of around 230.  Gregory Sosa wore the CGM for 14 days. The average reading was 232 mg/dL, 84% of time in target, 0% of time below target, 85% of time above target.  Checks blood pressure at home and usually sees 130s - 140s systolic.  Continues to work in [employer], [employer].  Review of Systems  Constitutional:  Negative for malaise/fatigue and weight loss.  Respiratory:  Negative for cough and shortness of breath.   Gastrointestinal:  Negative for abdominal pain, blood in stool, constipation, diarrhea and vomiting.  Genitourinary:  Negative for frequency and hematuria.  Psychiatric/Behavioral:  Negative for depression. The patient does not have insomnia.     Current Outpatient Medications  Medication Instructions   Blood Glucose Monitoring Suppl (ONE TOUCH ULTRA SYSTEM KIT) W/DEVICE KIT Use to check sugar once to twice a day. Dx code: 250.00.   Continuous Glucose Sensor (DEXCOM G7 SENSOR) MISC Place new sensor every 10 days. Use to monitor blood sugar continuously.   glucose blood test strip Use as instructed   hydrochlorothiazide  (MICROZIDE ) 12.5 mg, Oral, Daily   insulin  lispro (HUMALOG  KWIKPEN) 8 Units, Subcutaneous, 3 times daily before meals   Insulin  Pen Needle (PEN NEEDLES) 32G X 4 MM MISC Use new needle with each injection   Lancets (ONETOUCH ULTRASOFT) lancets Use to check sugar once to twice a day. Dx code: 250.00.   Lantus  SoloStar 48 Units, Subcutaneous, Daily at bedtime, Wait 3-5 days. If your fasting blood sugar (morning when you wake up) is above 180, increase by 2 units, and wait 3-5 days again. Repeat until fasting blood sugar reaches 180 -200 range.   rosuvastatin  (CRESTOR ) 20 mg, Oral, Daily    Rybelsus  14 mg, Oral, Daily   sildenafil  (VIAGRA ) 50 mg, Oral, As needed     Objective  Today's Vitals   10/31/23 1033 10/31/23 1050  BP: (!) 158/82 (!) 147/84  Pulse: 83 74  Temp: 98 F (36.7 C)   TempSrc: Oral   SpO2: 98%   Weight: 180 lb 3.2 oz (81.7 kg)   Body mass index is 29.09 kg/m.   Physical Exam Constitutional:      General: He is not in acute distress.    Appearance: Normal appearance.  HENT:     Mouth/Throat:     Mouth: Mucous membranes are moist.     Pharynx: No oropharyngeal exudate or posterior oropharyngeal erythema.   Eyes:     Extraocular Movements: Extraocular movements intact.     Conjunctiva/sclera: Conjunctivae normal.     Pupils: Pupils are equal, round, and reactive to light.   Neck:     Thyroid: No thyroid mass, thyromegaly or thyroid tenderness.     Vascular: No carotid bruit.   Cardiovascular:     Rate and Rhythm: Normal rate and regular rhythm.     Pulses: Normal pulses.     Heart sounds: No murmur heard. Pulmonary:     Effort: Pulmonary effort is normal.     Breath sounds: Normal breath sounds. No wheezing or rales.   Musculoskeletal:     Right lower leg: No edema.     Left lower leg: No edema.  Lymphadenopathy:     Cervical: No cervical adenopathy.   Skin:  General: Skin is warm and dry.   Neurological:     Mental Status: He is alert. Mental status is at baseline.     Cranial Nerves: No facial asymmetry.     Motor: No tremor.     Deep Tendon Reflexes: Reflexes normal.   Psychiatric:        Mood and Affect: Mood normal.        Behavior: Behavior normal.      Assessment & Plan  Problem List Items Addressed This Visit     Type 2 diabetes mellitus (HCC) - Primary (Chronic)   Improving, but still with hyperglycemia. Not a whole lot of glucose variability throughout the day, no hypoglycemia. Start Ozempic  0.25 mg weekly to increase every 4 weeks. Discontinue Rybelsus .      Relevant Medications   Semaglutide ,0.25 or  0.5MG /DOS, (OZEMPIC , 0.25 OR 0.5 MG/DOSE,) 2 MG/3ML SOPN   Semaglutide , 1 MG/DOSE, 4 MG/3ML SOPN (Start on 12/26/2023)   Semaglutide , 2 MG/DOSE, (OZEMPIC , 2 MG/DOSE,) 8 MG/3ML SOPN (Start on 01/23/2024)   olmesartan -hydrochlorothiazide  (BENICAR  HCT) 20-12.5 MG tablet   Hypertension   Stable but poorly controlled. On hydrochlorothiazide  12.5 daily. Start olmesartan -hydrochlorothiazide  20-12.5 mg daily.      Relevant Medications   olmesartan -hydrochlorothiazide  (BENICAR  HCT) 20-12.5 MG tablet   Colon cancer screening   Note positive Cologuard, but declines further screening at present.      Hypercalcemia   Didn't discuss this, but if he doesn't want parathyroidectomy could consider discontinuing hydrochlorothiazide  in favor of another agent. Anyway, confirmation of diagnosis of hyperparathyroid would require repeat testing of calcium , PTH, and vitamin D  from same sample.      Return in about 3 months (around 01/31/2024) for hypertension and diabetes.  Ozell Kung MD 10/31/2023, 11:00 AM

## 2023-11-01 NOTE — Assessment & Plan Note (Signed)
 Stable but poorly controlled. On hydrochlorothiazide  12.5 daily. Start olmesartan -hydrochlorothiazide  20-12.5 mg daily.

## 2023-11-01 NOTE — Assessment & Plan Note (Signed)
 Didn't discuss this, but if he doesn't want parathyroidectomy could consider discontinuing hydrochlorothiazide  in favor of another agent. Anyway, confirmation of diagnosis of hyperparathyroid would require repeat testing of calcium , PTH, and vitamin D  from same sample.

## 2023-11-01 NOTE — Assessment & Plan Note (Signed)
 Note positive Cologuard, but declines further screening at present.

## 2023-11-01 NOTE — Assessment & Plan Note (Signed)
 Improving, but still with hyperglycemia. Not a whole lot of glucose variability throughout the day, no hypoglycemia. Start Ozempic  0.25 mg weekly to increase every 4 weeks. Discontinue Rybelsus .

## 2023-11-02 ENCOUNTER — Other Ambulatory Visit (HOSPITAL_COMMUNITY): Payer: Self-pay

## 2023-11-02 NOTE — Addendum Note (Signed)
 Addended by: NORRINE SHARPER on: 11/02/2023 09:48 AM   Modules accepted: Level of Service

## 2023-11-02 NOTE — Progress Notes (Signed)
 Internal Medicine Clinic Attending  Case discussed with the resident at the time of the visit.  We reviewed the resident's history and exam and pertinent patient test results.  I agree with the assessment, diagnosis, and plan of care documented in the resident's note.

## 2023-11-09 ENCOUNTER — Other Ambulatory Visit (HOSPITAL_COMMUNITY): Payer: Self-pay

## 2023-11-13 ENCOUNTER — Other Ambulatory Visit (HOSPITAL_COMMUNITY): Payer: Self-pay

## 2023-11-21 ENCOUNTER — Telehealth: Payer: Self-pay | Admitting: Dietician

## 2023-11-21 DIAGNOSIS — E119 Type 2 diabetes mellitus without complications: Secondary | ICD-10-CM

## 2023-11-21 NOTE — Telephone Encounter (Signed)
 Scheduled for his annual Diabetes Self Management Education & Support visit before he sees his doctor in 12/27/23. Request referral. He also said he has not started Ozempic  yet but intends to in the next few weeks., reviewed side effects and how to mitigate them. He verbalized understanding.

## 2023-12-11 ENCOUNTER — Other Ambulatory Visit: Payer: Self-pay

## 2023-12-11 ENCOUNTER — Other Ambulatory Visit (HOSPITAL_COMMUNITY): Payer: Self-pay

## 2023-12-11 ENCOUNTER — Other Ambulatory Visit: Payer: Self-pay | Admitting: Student

## 2023-12-11 DIAGNOSIS — E119 Type 2 diabetes mellitus without complications: Secondary | ICD-10-CM

## 2023-12-11 MED ORDER — DEXCOM G7 SENSOR MISC
11 refills | Status: AC
  Filled 2023-12-11: qty 3, fill #0
  Filled 2023-12-12 (×2): qty 3, 30d supply, fill #0
  Filled 2024-01-18: qty 3, 30d supply, fill #1
  Filled 2024-02-27: qty 3, 30d supply, fill #2
  Filled 2024-03-28 (×2): qty 3, 30d supply, fill #3
  Filled 2024-05-05: qty 3, 30d supply, fill #4
  Filled 2024-06-05: qty 3, 30d supply, fill #5

## 2023-12-11 MED FILL — Sildenafil Citrate Tab 50 MG: ORAL | 20 days supply | Qty: 20 | Fill #1 | Status: AC

## 2023-12-11 NOTE — Telephone Encounter (Signed)
 Medication sent to pharmacy

## 2023-12-12 ENCOUNTER — Other Ambulatory Visit (HOSPITAL_COMMUNITY): Payer: Self-pay

## 2023-12-15 ENCOUNTER — Other Ambulatory Visit (HOSPITAL_COMMUNITY): Payer: Self-pay

## 2023-12-27 ENCOUNTER — Ambulatory Visit: Payer: Self-pay | Admitting: Dietician

## 2023-12-27 ENCOUNTER — Other Ambulatory Visit (HOSPITAL_COMMUNITY): Payer: Self-pay

## 2023-12-27 ENCOUNTER — Ambulatory Visit: Admitting: Student

## 2023-12-27 ENCOUNTER — Other Ambulatory Visit: Payer: Self-pay

## 2023-12-27 VITALS — Wt 179.8 lb

## 2023-12-27 VITALS — BP 144/85 | HR 80 | Temp 97.6°F | Wt 179.8 lb

## 2023-12-27 DIAGNOSIS — Z7985 Long-term (current) use of injectable non-insulin antidiabetic drugs: Secondary | ICD-10-CM

## 2023-12-27 DIAGNOSIS — I10 Essential (primary) hypertension: Secondary | ICD-10-CM

## 2023-12-27 DIAGNOSIS — Z794 Long term (current) use of insulin: Secondary | ICD-10-CM

## 2023-12-27 DIAGNOSIS — E119 Type 2 diabetes mellitus without complications: Secondary | ICD-10-CM

## 2023-12-27 LAB — GLUCOSE, CAPILLARY: Glucose-Capillary: 263 mg/dL — ABNORMAL HIGH (ref 70–99)

## 2023-12-27 LAB — POCT GLYCOSYLATED HEMOGLOBIN (HGB A1C): HbA1c, POC (controlled diabetic range): 10.6 % — AB (ref 0.0–7.0)

## 2023-12-27 MED ORDER — LANTUS SOLOSTAR 100 UNIT/ML ~~LOC~~ SOPN
27.0000 [IU] | PEN_INJECTOR | Freq: Two times a day (BID) | SUBCUTANEOUS | 11 refills | Status: AC
  Filled 2023-12-27 – 2024-01-08 (×2): qty 15, 28d supply, fill #0
  Filled 2024-02-15: qty 15, 28d supply, fill #1
  Filled 2024-04-08: qty 15, 28d supply, fill #2
  Filled 2024-05-17: qty 15, 28d supply, fill #3

## 2023-12-27 MED ORDER — INSULIN LISPRO (1 UNIT DIAL) 100 UNIT/ML (KWIKPEN)
10.0000 [IU] | PEN_INJECTOR | Freq: Three times a day (TID) | SUBCUTANEOUS | 11 refills | Status: AC
  Filled 2023-12-27: qty 15, 50d supply, fill #0
  Filled 2024-05-15: qty 15, 50d supply, fill #1

## 2023-12-27 NOTE — Patient Instructions (Addendum)
 Thank you for your visit today for your diabetes self management check up!  Good job taking your medicines. Checking your blood sugar, coming to your appointments and eating healthy!    Your dilated eye exam is due anytime. .   Please check your feet daily for cuts, sores, redness. If you find something- watch it closely. If it is not getting better or is getting worse, call the office.  Recommend yearly follow up  ~ August 2026.   Call anytime!  [place] [phone] (new number)

## 2023-12-27 NOTE — Progress Notes (Signed)
 Patient name: Gregory Sosa Date of birth: 1960/05/13 Date of visit: 12/27/23  Subjective   Reason for visit: Diabetes and Follow-up  No new concerns today.  Doing pretty well on the Ozempic  0.5 mg weekly at present.  Notes weight is stable.  Current Outpatient Medications  Medication Instructions   Blood Glucose Monitoring Suppl (ONE TOUCH ULTRA SYSTEM KIT) W/DEVICE KIT Use to check sugar once to twice a day. Dx code: 250.00.   Continuous Glucose Sensor (DEXCOM G7 SENSOR) MISC Place new sensor every 10 days. Use to monitor blood sugar continuously.   glucose blood test strip Use as instructed   insulin  lispro (HUMALOG  KWIKPEN) 10 Units, Subcutaneous, 3 times daily before meals   Insulin  Pen Needle (PEN NEEDLES) 32G X 4 MM MISC Use new needle with each injection   Lancets (ONETOUCH ULTRASOFT) lancets Use to check sugar once to twice a day. Dx code: 250.00.   Lantus  SoloStar 27 Units, Subcutaneous, 2 times daily   olmesartan -hydrochlorothiazide  (BENICAR  HCT) 20-12.5 MG tablet 1 tablet, Oral, Daily   [START ON 01/23/2024] Ozempic  (2 MG/DOSE) 2 mg, Subcutaneous, Weekly   rosuvastatin  (CRESTOR ) 20 mg, Oral, Daily   Semaglutide  (1 MG/DOSE) 1 mg, Subcutaneous, Weekly   sildenafil  (VIAGRA ) 50 mg, Oral, As needed     Objective  Today's Vitals   12/27/23 0952 12/27/23 1011  BP: (!) 161/87 (!) 144/85  Pulse: 83 80  Temp: 97.6 F (36.4 C)   TempSrc: Oral   Weight: 179 lb 12.8 oz (81.6 kg)   PainSc: 0-No pain   Body mass index is 29.02 kg/m.   Physical Exam Constitutional:      Appearance: Normal appearance.  Cardiovascular:     Rate and Rhythm: Normal rate and regular rhythm.  Pulmonary:     Effort: Pulmonary effort is normal. No respiratory distress.  Skin:    General: Skin is warm and dry.  Neurological:     Mental Status: He is alert.     Cranial Nerves: No facial asymmetry.  Psychiatric:        Mood and Affect: Affect normal.        Speech: Speech normal.         Behavior: Behavior normal.      Assessment & Plan   Type 2 diabetes mellitus without complication, with long-term current use of insulin  [facility]) Assessment & Plan: Chronic, not at goal.  Lab Results  Component Value Date   HGBA1C 10.6 (A) 12/27/2023   HGBA1C 11.3 (A) 09/25/2023   HGBA1C 11.4 (A) 06/08/2023   On Lantus  48 units daily and Humalog  8 units 3 times daily before meals.  He is using Humalog  before breakfast only.  Started Ozempic  recently and is now up to the 0.5 mg dose.  Intolerant to even low doses of metformin .  SGLT2 inhibitor unsafe due to history of euglycemic DKA.  Gregory Sosa wore the CGM for 14 days. The average reading was 293 mg/dL, 3% of time in target, 0% of time below target, 97% of time above target.  Continue increasing Ozempic  every 4 weeks as tolerated.  Increase Lantus  to 27 units twice daily.  For a.m. fasting sugars >180 he should increase each Lantus  dose by 2 units.  Increase Humalog  to 10 units 3 times daily before meals.     Orders: -     POCT glycosylated hemoglobin (Hb A1C) -     Lantus  SoloStar; Inject 27 Units into the skin 2 (two) times daily.  Dispense: 15 mL; Refill: 11 -  Insulin  Lispro (1 Unit Dial ); Inject 10 Units into the skin 3 (three) times daily before meals.  Dispense: 15 mL; Refill: 11  Primary hypertension Assessment & Plan: Chronic, not at goal.  BP Readings from Last 3 Encounters:  12/27/23 (!) 144/85  10/31/23 (!) 147/84  10/11/23 121/68   Continue working on weight loss with Ozempic .  Continue olmesartan -hydrochlorothiazide  20-12.5 mg daily.     Return in about 3 months (around 03/28/2024) for diabetes and hypertension.  Gregory Kung MD 12/27/2023, 5:19 PM

## 2023-12-27 NOTE — Assessment & Plan Note (Addendum)
 Chronic, not at goal.  Lab Results  Component Value Date   HGBA1C 10.6 (A) 12/27/2023   HGBA1C 11.3 (A) 09/25/2023   HGBA1C 11.4 (A) 06/08/2023   On Lantus  48 units daily and Humalog  8 units 3 times daily before meals.  He is using Humalog  before breakfast only.  Started Ozempic  recently and is now up to the 0.5 mg dose.  Intolerant to even low doses of metformin .  SGLT2 inhibitor unsafe due to history of euglycemic DKA.  [name] wore the CGM for 14 days. The average reading was 293 mg/dL, 3% of time in target, 0% of time below target, 97% of time above target.  Continue increasing Ozempic  every 4 weeks as tolerated.  Increase Lantus  to 27 units twice daily.  For a.m. fasting sugars >180 he should increase each Lantus  dose by 2 units.  Increase Humalog  to 10 units 3 times daily before meals.

## 2023-12-27 NOTE — Patient Instructions (Addendum)
 Increase your long-acting insulin  (glargine also known as Lantus ) by 2 units each morning and night if you morning fasting blood sugar is above 180.  Remember to bring all of the medications that you take (including over the counter medications and supplements) with you to every clinic visit.  This after visit summary is an important review of tests, referrals, and medication changes that were discussed during your visit. If you have questions or concerns, call [phone]. Outside of clinic business hours, call the main hospital at [phone] and ask the operator for the on-call internal medicine resident.   Ozell Kung MD 12/27/2023, 10:54 AM

## 2023-12-27 NOTE — Progress Notes (Signed)
 BP Readings from Last 3 Encounters:  10/31/23 (!) 147/84  10/11/23 121/68  09/25/23 131/78    Wt Readings from Last 10 Encounters:  12/27/23 179 lb 12.8 oz (81.6 kg)  10/31/23 180 lb 3.2 oz (81.7 kg)  10/11/23 179 lb (81.2 kg)  09/25/23 184 lb 14.4 oz (83.9 kg)  07/18/23 186 lb (84.4 kg)  06/08/23 182 lb 3.2 oz (82.6 kg)  01/11/23 175 lb (79.4 kg)  12/19/22 172 lb (78 kg)  09/15/22 171 lb 1.6 oz (77.6 kg)  07/18/22 168 lb 3.2 oz (76.3 kg)   Lab Results  Component Value Date   HGBA1C 10.6 (A) 12/27/2023   HGBA1C 11.3 (A) 09/25/2023   HGBA1C 11.4 (A) 06/08/2023   HGBA1C 10.9 (A) 01/11/2023   HGBA1C 10.3 (A) 09/15/2022    Diabetes Self-Management Education  Visit Type: Annual Follow-Up  Appt. Start Time: 915 Appt. End Time: 945  12/27/2023  [name], identified by name and date of birth, is a 63 y.o. male with a diagnosis of Diabetes:  .   ASSESSMENT  Weight 179 lb 12.8 oz (81.6 kg). Body mass index is 29.02 kg/m.   Diabetes Self-Management Education - 12/27/23 0900       Visit Information   Visit Type Annual Follow-Up      Health Coping   How would you rate your overall health? Good      Psychosocial Assessment   Patient Belief/Attitude about Diabetes Motivated to manage diabetes    What is the hardest part about your diabetes right now, causing you the most concern, or is the most worrisome to you about your diabetes?   Making healty food and beverage choices   not being able to eat the foods he loves- mashed potatoes, fries, mac and cheese   Self-care barriers None    Self-management support Family    Patient Concerns Glycemic Control;Support    Special Needs None    Preferred Learning Style No preference indicated    Learning Readiness --   when asked about readiness to change, he mentions he is thinking about decreasing the sugar he uses in his coffee in the morning\   How often do you need to have someone help you when you read instructions,  pamphlets, or other written materials from your doctor or pharmacy? 2 - Rarely    What is the last grade level you completed in school? 16      Pre-Education Assessment   Patient understands the diabetes disease and treatment process. Comprehends key points    Patient understands incorporating nutritional management into lifestyle. Comprehends key points    Patient undertands incorporating physical activity into lifestyle. Comprehends key points    Patient understands using medications safely. Comprehends key points    Patient understands monitoring blood glucose, interpreting and using results Needs Review    Patient understands prevention, detection, and treatment of acute complications. Comprehends key points    Patient understands prevention, detection, and treatment of chronic complications. Needs Review    Patient understands how to develop strategies to address psychosocial issues. Comprehends key points    Patient understands how to develop strategies to promote health/change behavior. Comprehends key points      Complications   Last HgB A1C per patient/outside source 10.6 %    How often do you check your blood sugar? > 4 times/day   uses cgm   Fasting Blood glucose range (mg/dL) >799    Postprandial Blood glucose range (mg/dL) >799  Number of hypoglycemic episodes per month 0    Number of hyperglycemic episodes ( >200mg /dL): Daily    Can you tell when your blood sugar is high? No    Have you had a dilated eye exam in the past 12 months? No    Have you had a dental exam in the past 12 months? Yes    Are you checking your feet? Yes    How many days per week are you checking your feet? --   says once every 2 weeks     Dietary Intake   Breakfast eggs and bacopn, 16 oz coffee with less sugar    Lunch meat and veggies, sparkling ice drink( zero sugar)    Dinner meat, veggies, beer 8oz x 2-3    Snack (evening) ice cream, peanut butter crackers    Beverage(s) see above       Activity / Exercise   Activity / Exercise Type ADL's;Light (walking / raking leaves);Moderate (swimming / aerobic walking)    How many days per week do you exercise? 5    How many minutes per day do you exercise? 30   gets more when mows the lawn, goes for at least one walk a week for 30 minutes and gets > 6000 steps on most days minimum   Total minutes per week of exercise 150      Patient Education   Previous Diabetes Education Yes   here   Monitoring Taught/evaluated CGM (comment)   gave him his cgm report and reveiwed how to interpret it and use it to help him note patterns   Chronic complications Retinopathy and reason for yearly dilated eye exams;Assessed and discussed foot care and prevention of foot problems      Individualized Goals (developed by patient)   Reducing Risk do foot checks daily      Post-Education Assessment   Patient understands monitoring blood glucose, interpreting and using results Comprehends key points    Patient understands prevention, detection, and treatment of chronic complications. Comprehends key points      Outcomes   Expected Outcomes Demonstrated interest in learning. Expect positive outcomes    Future DMSE Yearly    Program Status Completed      Subsequent Visit   Since your last visit have you continued or begun to take your medications as prescribed? Yes    Since your last visit have you had your blood pressure checked? Yes    Is your most recent blood pressure lower, unchanged, or higher since your last visit? Unchanged    Since your last visit have you experienced any weight changes? No change    Since your last visit, are you checking your blood glucose at least once a day? Yes          Individualized Plan for Diabetes Self-Management Training:   Learning Objective:  Patient will have a greater understanding of diabetes self-management. Patient education plan is to attend individual and/or group sessions per assessed needs and  concerns.   Plan:   Patient Instructions  Thank you for your visit today for your diabetes self management check up!  Good job taking your medicines. Checking your blood sugar, coming to your appointments and eating healthy!    Your dilated eye exam is due.   Please check your feet daily for cuts, sores, redness. If you find something- watch it closely. If it is not getting better or is getting worse, call the office.  Recommend follow up for annual  follow up in about 1 year ~ August 2026.   Call anytime!  [place] [phone] (new number)    Expected Outcomes:  Demonstrated interest in learning. Expect positive outcomes  Education material provided: Diabetes Resources  If problems or questions, patient to contact team via:  Phone  Future DSME appointment: Dorian [place] Franki Stemen, RD 12/27/2023 10:11 AM.

## 2023-12-27 NOTE — Assessment & Plan Note (Signed)
 Chronic, not at goal.  BP Readings from Last 3 Encounters:  12/27/23 (!) 144/85  10/31/23 (!) 147/84  10/11/23 121/68   Continue working on weight loss with Ozempic .  Continue olmesartan -hydrochlorothiazide  20-12.5 mg daily.

## 2023-12-29 ENCOUNTER — Encounter: Admitting: Student

## 2024-01-08 ENCOUNTER — Other Ambulatory Visit (HOSPITAL_COMMUNITY): Payer: Self-pay

## 2024-01-09 ENCOUNTER — Other Ambulatory Visit (HOSPITAL_COMMUNITY): Payer: Self-pay

## 2024-01-10 ENCOUNTER — Other Ambulatory Visit (HOSPITAL_COMMUNITY): Payer: Self-pay

## 2024-01-12 NOTE — Progress Notes (Signed)
 Internal Medicine Clinic Attending  Case discussed with the resident at the time of the visit.  We reviewed the resident's history and exam and pertinent patient test results.  I agree with the assessment, diagnosis, and plan of care documented in the resident's note.

## 2024-01-18 ENCOUNTER — Other Ambulatory Visit (HOSPITAL_COMMUNITY): Payer: Self-pay

## 2024-01-19 ENCOUNTER — Other Ambulatory Visit (HOSPITAL_COMMUNITY): Payer: Self-pay

## 2024-02-15 ENCOUNTER — Other Ambulatory Visit (HOSPITAL_COMMUNITY): Payer: Self-pay

## 2024-02-23 ENCOUNTER — Other Ambulatory Visit: Payer: Self-pay

## 2024-02-26 ENCOUNTER — Other Ambulatory Visit (HOSPITAL_COMMUNITY): Payer: Self-pay

## 2024-02-27 ENCOUNTER — Other Ambulatory Visit: Payer: Self-pay

## 2024-02-27 ENCOUNTER — Other Ambulatory Visit (HOSPITAL_COMMUNITY): Payer: Self-pay

## 2024-02-27 ENCOUNTER — Telehealth: Payer: Self-pay

## 2024-02-27 DIAGNOSIS — N529 Male erectile dysfunction, unspecified: Secondary | ICD-10-CM

## 2024-02-27 MED ORDER — SILDENAFIL CITRATE 50 MG PO TABS
50.0000 mg | ORAL_TABLET | ORAL | 1 refills | Status: AC | PRN
  Filled 2024-02-27: qty 15, 30d supply, fill #0
  Filled 2024-04-22: qty 15, 30d supply, fill #1
  Filled 2024-05-30: qty 10, 10d supply, fill #2

## 2024-02-27 NOTE — Telephone Encounter (Signed)
 Copied from CRM #[id number]. Topic: Clinical - Medication Refill >> Feb 27, 2024 11:44 AM Graeme ORN wrote: Medication: sildenafil  (VIAGRA ) 50 MG tablet  Has the patient contacted their pharmacy? Yes (Agent: If no, request that the patient contact the pharmacy for the refill. If patient does not wish to contact the pharmacy document the reason why and proceed with request.) (Agent: If yes, when and what did the pharmacy advise?)  This is the patient's preferred pharmacy:  [facility] [address], [address] [place] [address] Phone: [phone] Fax: [phone]   Is this the correct pharmacy for this prescription? Yes If no, delete pharmacy and type the correct one.   Has the prescription been filled recently? No May   Is the patient out of the medication? Yes  Has the patient been seen for an appointment in the last year OR does the patient have an upcoming appointment? Yes  Can we respond through MyChart? Yes  Agent: Please be advised that Rx refills may take up to 3 business days. We ask that you follow-up with your pharmacy.

## 2024-03-27 ENCOUNTER — Ambulatory Visit: Payer: Self-pay | Admitting: Student

## 2024-03-27 VITALS — BP 139/79 | HR 96 | Temp 97.5°F | Ht 66.0 in | Wt 180.6 lb

## 2024-03-27 DIAGNOSIS — F17210 Nicotine dependence, cigarettes, uncomplicated: Secondary | ICD-10-CM | POA: Diagnosis not present

## 2024-03-27 DIAGNOSIS — Z7985 Long-term (current) use of injectable non-insulin antidiabetic drugs: Secondary | ICD-10-CM | POA: Diagnosis not present

## 2024-03-27 DIAGNOSIS — E119 Type 2 diabetes mellitus without complications: Secondary | ICD-10-CM

## 2024-03-27 DIAGNOSIS — Z91148 Patient's other noncompliance with medication regimen for other reason: Secondary | ICD-10-CM

## 2024-03-27 DIAGNOSIS — Z794 Long term (current) use of insulin: Secondary | ICD-10-CM

## 2024-03-27 DIAGNOSIS — Z1211 Encounter for screening for malignant neoplasm of colon: Secondary | ICD-10-CM

## 2024-03-27 DIAGNOSIS — I10 Essential (primary) hypertension: Secondary | ICD-10-CM | POA: Diagnosis not present

## 2024-03-27 LAB — POCT GLYCOSYLATED HEMOGLOBIN (HGB A1C): HbA1c, POC (controlled diabetic range): 9.4 % — AB (ref 0.0–7.0)

## 2024-03-27 LAB — GLUCOSE, CAPILLARY: Glucose-Capillary: 220 mg/dL — ABNORMAL HIGH (ref 70–99)

## 2024-03-27 NOTE — Assessment & Plan Note (Signed)
 Gregory Sosa

## 2024-03-27 NOTE — Assessment & Plan Note (Signed)
 Orders:    Ambulatory referral to Gastroenterology

## 2024-03-27 NOTE — Assessment & Plan Note (Addendum)
 Chronic, not at goal.  Lab Results  Component Value Date   HGBA1C 9.4 (A) 03/27/2024   HGBA1C 10.6 (A) 12/27/2023   HGBA1C 11.3 (A) 09/25/2023   Chronic, poor control. Adherence is an issue, seems like he rarely takes his insulin . He reports good adherence to GLP-1 and fill history in EMR supports that he's picking it up but he hasn't lost any weight. Need to continue exploring barriers to therapy, his risk of microvascular complications and ASCVD is high. For now I recommend continuing glargine 27 units BID, lispro 10 units TID before meals, Ozempic  2 mg weekly.rosuvastatin  20 mg daily.  Orders:   POC Hbg A1C

## 2024-03-27 NOTE — Progress Notes (Unsigned)
 Patient name: Gregory Sosa Date of birth: 11-26-1960 Date of visit: 03/28/24  Subjective:  Reason for visit: Follow-up (Routine 3 month follow up for DM  / A1C)  Diabetes He presents for his follow-up diabetic visit. He has type 2 diabetes mellitus. Disease course: not improving. There are no hypoglycemic associated symptoms. Pertinent negatives for diabetes include no blurred vision, no chest pain, no polyuria and no weight loss. There are no diabetic complications. Risk factors for coronary artery disease include diabetes mellitus, hypertension, male sex and dyslipidemia. Current diabetic treatment includes insulin  injections (GLP-1 agonist). He is compliant with treatment some of the time.   Gregory Sosa wore the CGM for 14 days. The average reading was 257 mg/dL, 6% of time in target, 0% of time below target, 94% of time above target.   Current Outpatient Medications  Medication Instructions   Blood Glucose Monitoring Suppl (ONE TOUCH ULTRA SYSTEM KIT) W/DEVICE KIT Use to check sugar once to twice a day. Dx code: 250.00.   Continuous Glucose Sensor (DEXCOM G7 SENSOR) MISC Place new sensor every 10 days. Use to monitor blood sugar continuously.   glucose blood test strip Use as instructed   insulin  lispro (HUMALOG  KWIKPEN) 10 Units, Subcutaneous, 3 times daily before meals   Insulin  Pen Needle (PEN NEEDLES) 32G X 4 MM MISC Use new needle with each injection   Lancets (ONETOUCH ULTRASOFT) lancets Use to check sugar once to twice a day. Dx code: 250.00.   Lantus  SoloStar 27 Units, Subcutaneous, 2 times daily   olmesartan -hydrochlorothiazide  (BENICAR  HCT) 20-12.5 MG tablet 1 tablet, Oral, Daily   Ozempic  (2 MG/DOSE) 2 mg, Subcutaneous, Weekly   rosuvastatin  (CRESTOR ) 20 mg, Oral, Daily   sildenafil  (VIAGRA ) 50 mg, Oral, As needed    Objective: Today's Vitals   03/27/24 1009 03/27/24 1013  BP: (!) 157/72 139/79  Pulse: 96   Temp: (!) 97.5 F (36.4 C)   TempSrc: Oral   SpO2: 93%    Weight: 180 lb 9.6 oz (81.9 kg)   Height: 5' 6 (1.676 m)   PainSc: 0-No pain   Body mass index is 29.15 kg/m.   Physical Exam Constitutional:      Appearance: Normal appearance.  Cardiovascular:     Rate and Rhythm: Normal rate and regular rhythm.     Heart sounds: No murmur heard. Pulmonary:     Effort: Pulmonary effort is normal. No respiratory distress.     Breath sounds: No wheezing.  Skin:    General: Skin is warm and dry.  Neurological:     Mental Status: He is alert.     Cranial Nerves: No facial asymmetry.  Psychiatric:        Mood and Affect: Affect normal.        Speech: Speech normal.        Behavior: Behavior normal.     Assessment and plan:  Assessment & Plan Type 2 diabetes mellitus without complication, without long-term current use of insulin  (HCC) Chronic, not at goal.  Lab Results  Component Value Date   HGBA1C 9.4 (A) 03/27/2024   HGBA1C 10.6 (A) 12/27/2023   HGBA1C 11.3 (A) 09/25/2023   Chronic, poor control. Adherence is an issue, seems like he rarely takes his insulin . He reports good adherence to GLP-1 and fill history in EMR supports that he's picking it up but he hasn't lost any weight. Need to continue exploring barriers to therapy, his risk of microvascular complications and ASCVD is high. For now I recommend continuing  glargine 27 units BID, lispro 10 units TID before meals, Ozempic  2 mg weekly.rosuvastatin  20 mg daily.  Orders:   POC Hbg A1C   Primary hypertension Chronic, not at goal.  BP Readings from Last 3 Encounters:  03/27/24 139/79  12/27/23 (!) 144/85  10/31/23 (!) 147/84   Chronic, poor control. Measurements outside of clinic are concordant with clinic measurements. Continue working on weight loss with Ozempic .  Continue olmesartan -hydrochlorothiazide  20-12.5 mg daily.     Colon cancer screening Positive FIT test this year, he will get colonoscopy done after new year. Orders:   Ambulatory referral to  Gastroenterology   Return in about 3 months (around 06/27/2024) for routine follow-up for diabetes and hypertension.  Gregory Kung MD 03/28/2024, 6:03 AM

## 2024-03-27 NOTE — Patient Instructions (Signed)
 Remember to bring all of the medications that you take (including over the counter medications and supplements) with you to every clinic visit.  This after visit summary is an important review of tests, referrals, and medication changes that were discussed during your visit. If you have questions or concerns, call [phone]. Outside of clinic business hours, call the main hospital at [phone] and ask the operator for the on-call internal medicine resident.   Ozell Kung MD 03/27/2024, 10:41 AM

## 2024-03-28 ENCOUNTER — Telehealth (HOSPITAL_COMMUNITY): Payer: Self-pay

## 2024-03-28 ENCOUNTER — Other Ambulatory Visit: Payer: Self-pay

## 2024-03-28 ENCOUNTER — Other Ambulatory Visit (HOSPITAL_COMMUNITY): Payer: Self-pay

## 2024-03-28 ENCOUNTER — Encounter (HOSPITAL_COMMUNITY): Payer: Self-pay

## 2024-03-28 ENCOUNTER — Telehealth: Payer: Self-pay

## 2024-03-28 DIAGNOSIS — E119 Type 2 diabetes mellitus without complications: Secondary | ICD-10-CM

## 2024-03-28 NOTE — Telephone Encounter (Signed)
 Pa has been submitted. Pa is not needed. Please see my note from today.

## 2024-03-28 NOTE — Telephone Encounter (Signed)
 Pharmacy Patient Advocate Encounter  Received notification from [facility] that Prior Authorization for Dexcom G7 Sensor  has been APPROVED from 03/28/24 to 03/28/27. Ran test claim, Copay is $35. This test claim was processed through [facility]- copay amounts may vary at other pharmacies due to pharmacy/plan contracts, or as the patient moves through the different stages of their insurance plan.   PA #/Case ID/Reference #: [id number]

## 2024-03-28 NOTE — Telephone Encounter (Signed)
 Copied from CRM [id number]. Topic: Clinical - Medication Refill >> Mar 28, 2024  2:48 PM DeAngela L wrote: Medication: Continuous Glucose Sensor (DEXCOM G7 SENSOR)   Has the patient contacted their pharmacy? Yes, the patient changed his insurance and the pharmacy says he needs the provider to contact the pharmacy  (Agent: If no, request that the patient contact the pharmacy for the refill. If patient does not wish to contact the pharmacy document the reason why and proceed with request.) (Agent: If yes, when and what did the pharmacy advise?)  This is the patient's preferred pharmacy:  [facility] [address] [place] [address] Phone: [phone] Fax: [phone]  Is this the correct pharmacy for this prescription? Yes  If no, delete pharmacy and type the correct one.   Has the prescription been filled recently? Yes   Is the patient out of the medication? Yes   Has the patient been seen for an appointment in the last year OR does the patient have an upcoming appointment? Yes  Can we respond through MyChart? Yes  Agent: Please be advised that Rx refills may take up to 3 business days. We ask that you follow-up with your pharmacy.

## 2024-03-28 NOTE — Telephone Encounter (Signed)
 I called pt to let him know he has refills on Dexcom G7. Stated he called the pharmacy who told him they have sent over a request to his provider. Per pharmacy encounter, a PA is needed. Pt said he has changed insurance and card was given to us  at his appt yesterday. Sending to Jada.

## 2024-03-28 NOTE — Telephone Encounter (Signed)
 Copied from CRM [id number]. Topic: Clinical - Medication Refill >> Mar 28, 2024  2:48 PM DeAngela L wrote: Medication: Continuous Glucose Sensor (DEXCOM G7 SENSOR)   Has the patient contacted their pharmacy? Yes, the patient changed his insurance and the pharmacy says he needs the provider to contact the pharmacy  (Agent: If no, request that the patient contact the pharmacy for the refill. If patient does not wish to contact the pharmacy document the reason why and proceed with request.) (Agent: If yes, when and what did the pharmacy advise?)  This is the patient's preferred pharmacy:  [facility] [address], [address] [place] [address] Phone: [phone] Fax: [phone]  Is this the correct pharmacy for this prescription? Yes  If no, delete pharmacy and type the correct one.   Has the prescription been filled recently? Yes   Is the patient out of the medication? Yes   Has the patient been seen for an appointment in the last year OR does the patient have an upcoming appointment? Yes  Can we respond through MyChart? Yes  Agent: Please be advised that Rx refills may take up to 3 business days. We ask that you follow-up with your pharmacy. >> Mar 28, 2024  2:54 PM DeAngela L wrote: The patient states he is leaving town for the holiday on Saturday and would like to ask if this refill can be completed as soon as possible so he can pick this up from the pharmacy on tomorrow 03/29/2024

## 2024-03-28 NOTE — Telephone Encounter (Signed)
 PA request has been Received. New Encounter has been or will be created for follow up. For additional info see Pharmacy Prior Auth telephone encounter from 03/28/24.

## 2024-03-28 NOTE — Telephone Encounter (Signed)
 Prior Authorization for patient (Dexcom G7 Sensor) came through on cover my meds was submitted awaiting approval or denial.  XZB:[id number]

## 2024-03-28 NOTE — Telephone Encounter (Signed)
 Pharmacy Patient Advocate Encounter   Received notification from Pt Calls Messages that prior authorization for Dexcom G7 Sensor  is required/requested.   Insurance verification completed.   The patient is insured through [facility].   Per test claim: PA required; PA submitted to above mentioned insurance via Latent Key/confirmation #/EOC [id number] Status is pending

## 2024-03-28 NOTE — Telephone Encounter (Signed)
 Tamarcus Condie (Key: [id number]) Need Help? Call us  at [phone] Outcome Additional Information Required Prior Authorization Not Required Drug Dexcom G7 Sensor ePA cloud logo Form [facility] Commercial Electronic Request Form  I called and spoke to the pharmay I asked her to run the pa, per pharmacists the rx went through she will go ahead and get the rx ready. Patient is aware.

## 2024-03-29 ENCOUNTER — Other Ambulatory Visit (HOSPITAL_COMMUNITY): Payer: Self-pay

## 2024-04-08 ENCOUNTER — Telehealth (HOSPITAL_COMMUNITY): Payer: Self-pay

## 2024-04-08 ENCOUNTER — Other Ambulatory Visit (HOSPITAL_COMMUNITY): Payer: Self-pay

## 2024-04-08 ENCOUNTER — Encounter (HOSPITAL_COMMUNITY): Payer: Self-pay

## 2024-04-08 ENCOUNTER — Other Ambulatory Visit: Payer: Self-pay

## 2024-04-08 NOTE — Telephone Encounter (Signed)
 PA request has been Received. New Encounter has been or will be created for follow up. For additional info see Pharmacy Prior Auth telephone encounter from 04/08/24.

## 2024-04-08 NOTE — Telephone Encounter (Signed)
 Pharmacy Patient Advocate Encounter   Received notification from Pt Calls Messages that prior authorization for Ozempic  (2 MG/DOSE) 8MG /3ML pen-injectors  is required/requested.   Insurance verification completed.   The patient is insured through [facility].   Per test claim: PA required; PA submitted to above mentioned insurance via Latent Key/confirmation #/EOC [id number] Status is pending

## 2024-04-08 NOTE — Telephone Encounter (Signed)
 Pharmacy Patient Advocate Encounter  Received notification from [facility] that Prior Authorization for Ozempic  (2 MG/DOSE) 8MG /3ML pen-injectors  has been APPROVED from 04/08/24 to 04/08/25. Ran test claim, Copay is $24.99. This test claim was processed through [facility]- copay amounts may vary at other pharmacies due to pharmacy/plan contracts, or as the patient moves through the different stages of their insurance plan.   PA #/Case ID/Reference #: [id number] .

## 2024-04-09 NOTE — Progress Notes (Signed)
 Internal Medicine Clinic Attending  Case discussed with the resident at the time of the visit.  We reviewed the resident's history and exam and pertinent patient test results.  I agree with the assessment, diagnosis, and plan of care documented in the resident's note.

## 2024-04-10 ENCOUNTER — Ambulatory Visit: Payer: Self-pay
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
## 2024-04-12 ENCOUNTER — Ambulatory Visit: Payer: Self-pay

## 2024-04-12 ENCOUNTER — Other Ambulatory Visit: Payer: Self-pay

## 2024-04-12 VITALS — BP 165/81 | HR 97 | Temp 97.6°F | Ht 66.0 in | Wt 185.4 lb

## 2024-04-12 DIAGNOSIS — M67911 Unspecified disorder of synovium and tendon, right shoulder: Secondary | ICD-10-CM
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
## 2024-04-12 NOTE — Patient Instructions (Signed)
 Please follow instructions as discussed in today's plan: - You received a steroid shot in your right shoulder today.  If your right shoulder pain continues to worsen please make an appointment to come see us . - Continue with chiropractor sessions if it has been providing you relief -You can continue taking Advil  if it has helped with your right shoulder pain.  However, do not take it continuously for more than 2 weeks as it can damage her kidneys. - You can take Tylenol for additional pain control.  Do not take more than 4000 mg of Tylenol in 1 day.  Thank you  Rebecka Pion, DO

## 2024-04-19 NOTE — Progress Notes (Signed)
 Internal Medicine Clinic Attending  I was physically present during the key portions of the resident provided service and participated in the medical decision making of patient's management care. I reviewed pertinent patient test results.  The assessment, diagnosis, and plan were formulated together and I agree with the documentation in the resident's note. I was present for the entire procedure, one correction is that a 25 gauge needle was used. Rosan Dayton BROCKS, DO

## 2024-04-22 ENCOUNTER — Other Ambulatory Visit (HOSPITAL_COMMUNITY): Payer: Self-pay

## 2024-05-17 ENCOUNTER — Other Ambulatory Visit (HOSPITAL_COMMUNITY): Payer: Self-pay

## 2024-05-30 ENCOUNTER — Other Ambulatory Visit (HOSPITAL_COMMUNITY): Payer: Self-pay

## 2024-06-05 ENCOUNTER — Other Ambulatory Visit: Payer: Self-pay

## 2024-06-05 ENCOUNTER — Other Ambulatory Visit (HOSPITAL_COMMUNITY): Payer: Self-pay

## 2024-06-25 ENCOUNTER — Ambulatory Visit: Admitting: Student
